# Patient Record
Sex: Male | Born: 1945 | Race: White | Hispanic: No | Smoking: Former smoker
Health system: Southern US, Community
[De-identification: ages and names within clinical notes are randomized; demographics above are authoritative.]

## PROBLEM LIST (undated history)

## (undated) ENCOUNTER — Inpatient Hospital Stay (HOSPITAL_BASED_OUTPATIENT_CLINIC_OR_DEPARTMENT_OTHER): Payer: Medicare HMO | Admitting: Podiatry

## (undated) DIAGNOSIS — E119 Type 2 diabetes mellitus without complications: Secondary | ICD-10-CM

## (undated) DIAGNOSIS — L0390 Cellulitis, unspecified: Secondary | ICD-10-CM

## (undated) DIAGNOSIS — I4891 Unspecified atrial fibrillation: Secondary | ICD-10-CM

## (undated) DIAGNOSIS — F329 Major depressive disorder, single episode, unspecified: Secondary | ICD-10-CM

## (undated) DIAGNOSIS — G6181 Chronic inflammatory demyelinating polyneuritis: Secondary | ICD-10-CM

## (undated) DIAGNOSIS — M109 Gout, unspecified: Secondary | ICD-10-CM

## (undated) DIAGNOSIS — I517 Cardiomegaly: Secondary | ICD-10-CM

## (undated) DIAGNOSIS — F32A Depression, unspecified: Secondary | ICD-10-CM

## (undated) HISTORY — DX: Major depressive disorder, single episode, unspecified: F32.9

## (undated) HISTORY — PX: APPENDECTOMY: SHX54

## (undated) HISTORY — DX: Type 2 diabetes mellitus without complications: E11.9

## (undated) HISTORY — DX: Gout, unspecified: M10.9

## (undated) HISTORY — DX: Chronic inflammatory demyelinating polyneuritis: G61.81

## (undated) HISTORY — DX: Cardiomegaly: I51.7

## (undated) HISTORY — DX: Unspecified atrial fibrillation: I48.91

## (undated) HISTORY — PX: VOCAL CORD LATERALIZATION, ENDOSCOPIC APPROACH W/ MLB: SHX2664

## (undated) HISTORY — DX: Cellulitis, unspecified: L03.90

## (undated) HISTORY — DX: Depression, unspecified: F32.A

---

## 2011-08-21 DIAGNOSIS — R29898 Other symptoms and signs involving the musculoskeletal system: Secondary | ICD-10-CM | POA: Insufficient documentation

## 2011-10-12 DIAGNOSIS — R490 Dysphonia: Secondary | ICD-10-CM | POA: Insufficient documentation

## 2011-10-12 DIAGNOSIS — R1310 Dysphagia, unspecified: Secondary | ICD-10-CM | POA: Insufficient documentation

## 2011-11-02 DIAGNOSIS — D333 Benign neoplasm of cranial nerves: Secondary | ICD-10-CM | POA: Insufficient documentation

## 2014-06-26 DIAGNOSIS — I8393 Asymptomatic varicose veins of bilateral lower extremities: Secondary | ICD-10-CM | POA: Insufficient documentation

## 2014-06-26 DIAGNOSIS — I8390 Asymptomatic varicose veins of unspecified lower extremity: Secondary | ICD-10-CM | POA: Insufficient documentation

## 2014-11-20 DIAGNOSIS — E1121 Type 2 diabetes mellitus with diabetic nephropathy: Secondary | ICD-10-CM | POA: Diagnosis not present

## 2014-11-20 DIAGNOSIS — R0600 Dyspnea, unspecified: Secondary | ICD-10-CM | POA: Diagnosis not present

## 2014-11-20 DIAGNOSIS — S91109A Unspecified open wound of unspecified toe(s) without damage to nail, initial encounter: Secondary | ICD-10-CM | POA: Diagnosis not present

## 2014-11-20 DIAGNOSIS — M109 Gout, unspecified: Secondary | ICD-10-CM | POA: Diagnosis not present

## 2014-11-20 DIAGNOSIS — F321 Major depressive disorder, single episode, moderate: Secondary | ICD-10-CM | POA: Diagnosis not present

## 2014-11-20 DIAGNOSIS — I10 Essential (primary) hypertension: Secondary | ICD-10-CM | POA: Diagnosis not present

## 2014-11-20 DIAGNOSIS — E1140 Type 2 diabetes mellitus with diabetic neuropathy, unspecified: Secondary | ICD-10-CM | POA: Diagnosis not present

## 2014-11-27 DIAGNOSIS — L89893 Pressure ulcer of other site, stage 3: Secondary | ICD-10-CM | POA: Diagnosis not present

## 2014-11-27 DIAGNOSIS — E119 Type 2 diabetes mellitus without complications: Secondary | ICD-10-CM | POA: Diagnosis not present

## 2014-11-27 DIAGNOSIS — Z794 Long term (current) use of insulin: Secondary | ICD-10-CM | POA: Diagnosis not present

## 2014-11-27 DIAGNOSIS — I10 Essential (primary) hypertension: Secondary | ICD-10-CM | POA: Diagnosis not present

## 2014-11-27 DIAGNOSIS — F329 Major depressive disorder, single episode, unspecified: Secondary | ICD-10-CM | POA: Diagnosis not present

## 2014-12-04 DIAGNOSIS — E119 Type 2 diabetes mellitus without complications: Secondary | ICD-10-CM | POA: Diagnosis not present

## 2014-12-04 DIAGNOSIS — I10 Essential (primary) hypertension: Secondary | ICD-10-CM | POA: Diagnosis not present

## 2014-12-04 DIAGNOSIS — F329 Major depressive disorder, single episode, unspecified: Secondary | ICD-10-CM | POA: Diagnosis not present

## 2014-12-04 DIAGNOSIS — L89893 Pressure ulcer of other site, stage 3: Secondary | ICD-10-CM | POA: Diagnosis not present

## 2014-12-04 DIAGNOSIS — Z794 Long term (current) use of insulin: Secondary | ICD-10-CM | POA: Diagnosis not present

## 2014-12-11 DIAGNOSIS — Z794 Long term (current) use of insulin: Secondary | ICD-10-CM | POA: Diagnosis not present

## 2014-12-11 DIAGNOSIS — I10 Essential (primary) hypertension: Secondary | ICD-10-CM | POA: Diagnosis not present

## 2014-12-11 DIAGNOSIS — L89893 Pressure ulcer of other site, stage 3: Secondary | ICD-10-CM | POA: Diagnosis not present

## 2014-12-11 DIAGNOSIS — F329 Major depressive disorder, single episode, unspecified: Secondary | ICD-10-CM | POA: Diagnosis not present

## 2014-12-11 DIAGNOSIS — E119 Type 2 diabetes mellitus without complications: Secondary | ICD-10-CM | POA: Diagnosis not present

## 2014-12-18 DIAGNOSIS — I10 Essential (primary) hypertension: Secondary | ICD-10-CM | POA: Diagnosis not present

## 2014-12-18 DIAGNOSIS — F329 Major depressive disorder, single episode, unspecified: Secondary | ICD-10-CM | POA: Diagnosis not present

## 2014-12-18 DIAGNOSIS — E119 Type 2 diabetes mellitus without complications: Secondary | ICD-10-CM | POA: Diagnosis not present

## 2014-12-18 DIAGNOSIS — L89893 Pressure ulcer of other site, stage 3: Secondary | ICD-10-CM | POA: Diagnosis not present

## 2014-12-18 DIAGNOSIS — Z794 Long term (current) use of insulin: Secondary | ICD-10-CM | POA: Diagnosis not present

## 2014-12-21 DIAGNOSIS — M546 Pain in thoracic spine: Secondary | ICD-10-CM | POA: Diagnosis not present

## 2014-12-21 DIAGNOSIS — E1121 Type 2 diabetes mellitus with diabetic nephropathy: Secondary | ICD-10-CM | POA: Diagnosis not present

## 2014-12-25 DIAGNOSIS — Z794 Long term (current) use of insulin: Secondary | ICD-10-CM | POA: Diagnosis not present

## 2014-12-25 DIAGNOSIS — L89893 Pressure ulcer of other site, stage 3: Secondary | ICD-10-CM | POA: Diagnosis not present

## 2014-12-25 DIAGNOSIS — E119 Type 2 diabetes mellitus without complications: Secondary | ICD-10-CM | POA: Diagnosis not present

## 2014-12-25 DIAGNOSIS — I10 Essential (primary) hypertension: Secondary | ICD-10-CM | POA: Diagnosis not present

## 2014-12-25 DIAGNOSIS — F329 Major depressive disorder, single episode, unspecified: Secondary | ICD-10-CM | POA: Diagnosis not present

## 2015-01-01 DIAGNOSIS — I10 Essential (primary) hypertension: Secondary | ICD-10-CM | POA: Diagnosis not present

## 2015-01-01 DIAGNOSIS — E119 Type 2 diabetes mellitus without complications: Secondary | ICD-10-CM | POA: Diagnosis not present

## 2015-01-01 DIAGNOSIS — Z794 Long term (current) use of insulin: Secondary | ICD-10-CM | POA: Diagnosis not present

## 2015-01-01 DIAGNOSIS — L89893 Pressure ulcer of other site, stage 3: Secondary | ICD-10-CM | POA: Diagnosis not present

## 2015-01-01 DIAGNOSIS — F329 Major depressive disorder, single episode, unspecified: Secondary | ICD-10-CM | POA: Diagnosis not present

## 2015-01-06 DIAGNOSIS — F329 Major depressive disorder, single episode, unspecified: Secondary | ICD-10-CM | POA: Diagnosis not present

## 2015-01-06 DIAGNOSIS — Z794 Long term (current) use of insulin: Secondary | ICD-10-CM | POA: Diagnosis not present

## 2015-01-06 DIAGNOSIS — L89893 Pressure ulcer of other site, stage 3: Secondary | ICD-10-CM | POA: Diagnosis not present

## 2015-01-06 DIAGNOSIS — E119 Type 2 diabetes mellitus without complications: Secondary | ICD-10-CM | POA: Diagnosis not present

## 2015-01-06 DIAGNOSIS — I10 Essential (primary) hypertension: Secondary | ICD-10-CM | POA: Diagnosis not present

## 2015-01-14 DIAGNOSIS — E119 Type 2 diabetes mellitus without complications: Secondary | ICD-10-CM | POA: Diagnosis not present

## 2015-01-14 DIAGNOSIS — L89893 Pressure ulcer of other site, stage 3: Secondary | ICD-10-CM | POA: Diagnosis not present

## 2015-01-14 DIAGNOSIS — I10 Essential (primary) hypertension: Secondary | ICD-10-CM | POA: Diagnosis not present

## 2015-01-14 DIAGNOSIS — F329 Major depressive disorder, single episode, unspecified: Secondary | ICD-10-CM | POA: Diagnosis not present

## 2015-01-14 DIAGNOSIS — Z794 Long term (current) use of insulin: Secondary | ICD-10-CM | POA: Diagnosis not present

## 2015-01-20 DIAGNOSIS — F329 Major depressive disorder, single episode, unspecified: Secondary | ICD-10-CM | POA: Diagnosis not present

## 2015-01-20 DIAGNOSIS — I10 Essential (primary) hypertension: Secondary | ICD-10-CM | POA: Diagnosis not present

## 2015-01-20 DIAGNOSIS — E119 Type 2 diabetes mellitus without complications: Secondary | ICD-10-CM | POA: Diagnosis not present

## 2015-01-20 DIAGNOSIS — Z794 Long term (current) use of insulin: Secondary | ICD-10-CM | POA: Diagnosis not present

## 2015-01-20 DIAGNOSIS — L89893 Pressure ulcer of other site, stage 3: Secondary | ICD-10-CM | POA: Diagnosis not present

## 2015-01-26 DIAGNOSIS — E119 Type 2 diabetes mellitus without complications: Secondary | ICD-10-CM | POA: Diagnosis not present

## 2015-01-26 DIAGNOSIS — I10 Essential (primary) hypertension: Secondary | ICD-10-CM | POA: Diagnosis not present

## 2015-01-26 DIAGNOSIS — L89893 Pressure ulcer of other site, stage 3: Secondary | ICD-10-CM | POA: Diagnosis not present

## 2015-01-26 DIAGNOSIS — F329 Major depressive disorder, single episode, unspecified: Secondary | ICD-10-CM | POA: Diagnosis not present

## 2015-01-26 DIAGNOSIS — Z794 Long term (current) use of insulin: Secondary | ICD-10-CM | POA: Diagnosis not present

## 2015-01-29 DIAGNOSIS — E119 Type 2 diabetes mellitus without complications: Secondary | ICD-10-CM | POA: Diagnosis not present

## 2015-01-29 DIAGNOSIS — I10 Essential (primary) hypertension: Secondary | ICD-10-CM | POA: Diagnosis not present

## 2015-01-29 DIAGNOSIS — L89893 Pressure ulcer of other site, stage 3: Secondary | ICD-10-CM | POA: Diagnosis not present

## 2015-01-29 DIAGNOSIS — F329 Major depressive disorder, single episode, unspecified: Secondary | ICD-10-CM | POA: Diagnosis not present

## 2015-01-29 DIAGNOSIS — Z794 Long term (current) use of insulin: Secondary | ICD-10-CM | POA: Diagnosis not present

## 2015-02-05 DIAGNOSIS — Z794 Long term (current) use of insulin: Secondary | ICD-10-CM | POA: Diagnosis not present

## 2015-02-05 DIAGNOSIS — E119 Type 2 diabetes mellitus without complications: Secondary | ICD-10-CM | POA: Diagnosis not present

## 2015-02-05 DIAGNOSIS — F329 Major depressive disorder, single episode, unspecified: Secondary | ICD-10-CM | POA: Diagnosis not present

## 2015-02-05 DIAGNOSIS — L89893 Pressure ulcer of other site, stage 3: Secondary | ICD-10-CM | POA: Diagnosis not present

## 2015-02-05 DIAGNOSIS — I10 Essential (primary) hypertension: Secondary | ICD-10-CM | POA: Diagnosis not present

## 2015-03-02 DIAGNOSIS — S91109A Unspecified open wound of unspecified toe(s) without damage to nail, initial encounter: Secondary | ICD-10-CM | POA: Diagnosis not present

## 2015-03-02 DIAGNOSIS — E780 Pure hypercholesterolemia: Secondary | ICD-10-CM | POA: Diagnosis not present

## 2015-03-02 DIAGNOSIS — E1121 Type 2 diabetes mellitus with diabetic nephropathy: Secondary | ICD-10-CM | POA: Diagnosis not present

## 2015-03-04 DIAGNOSIS — E11621 Type 2 diabetes mellitus with foot ulcer: Secondary | ICD-10-CM | POA: Diagnosis not present

## 2015-03-04 DIAGNOSIS — L97519 Non-pressure chronic ulcer of other part of right foot with unspecified severity: Secondary | ICD-10-CM | POA: Diagnosis not present

## 2015-03-04 DIAGNOSIS — E1142 Type 2 diabetes mellitus with diabetic polyneuropathy: Secondary | ICD-10-CM | POA: Diagnosis not present

## 2015-03-11 DIAGNOSIS — E11621 Type 2 diabetes mellitus with foot ulcer: Secondary | ICD-10-CM | POA: Diagnosis not present

## 2015-03-11 DIAGNOSIS — L97519 Non-pressure chronic ulcer of other part of right foot with unspecified severity: Secondary | ICD-10-CM | POA: Diagnosis not present

## 2015-03-11 DIAGNOSIS — E1142 Type 2 diabetes mellitus with diabetic polyneuropathy: Secondary | ICD-10-CM | POA: Diagnosis not present

## 2015-03-25 DIAGNOSIS — E11621 Type 2 diabetes mellitus with foot ulcer: Secondary | ICD-10-CM | POA: Diagnosis not present

## 2015-03-25 DIAGNOSIS — L97519 Non-pressure chronic ulcer of other part of right foot with unspecified severity: Secondary | ICD-10-CM | POA: Diagnosis not present

## 2015-03-25 DIAGNOSIS — E1142 Type 2 diabetes mellitus with diabetic polyneuropathy: Secondary | ICD-10-CM | POA: Diagnosis not present

## 2015-04-08 DIAGNOSIS — E11621 Type 2 diabetes mellitus with foot ulcer: Secondary | ICD-10-CM | POA: Diagnosis not present

## 2015-04-08 DIAGNOSIS — E1142 Type 2 diabetes mellitus with diabetic polyneuropathy: Secondary | ICD-10-CM | POA: Diagnosis not present

## 2015-04-08 DIAGNOSIS — L97519 Non-pressure chronic ulcer of other part of right foot with unspecified severity: Secondary | ICD-10-CM | POA: Diagnosis not present

## 2015-04-29 DIAGNOSIS — L97519 Non-pressure chronic ulcer of other part of right foot with unspecified severity: Secondary | ICD-10-CM | POA: Diagnosis not present

## 2015-04-29 DIAGNOSIS — E11621 Type 2 diabetes mellitus with foot ulcer: Secondary | ICD-10-CM | POA: Diagnosis not present

## 2015-05-18 DIAGNOSIS — L97519 Non-pressure chronic ulcer of other part of right foot with unspecified severity: Secondary | ICD-10-CM | POA: Diagnosis not present

## 2015-05-18 DIAGNOSIS — E1142 Type 2 diabetes mellitus with diabetic polyneuropathy: Secondary | ICD-10-CM | POA: Diagnosis not present

## 2015-05-18 DIAGNOSIS — E11621 Type 2 diabetes mellitus with foot ulcer: Secondary | ICD-10-CM | POA: Diagnosis not present

## 2015-05-26 DIAGNOSIS — L97509 Non-pressure chronic ulcer of other part of unspecified foot with unspecified severity: Secondary | ICD-10-CM | POA: Diagnosis not present

## 2015-05-26 DIAGNOSIS — E11621 Type 2 diabetes mellitus with foot ulcer: Secondary | ICD-10-CM | POA: Diagnosis not present

## 2015-06-08 DIAGNOSIS — E1142 Type 2 diabetes mellitus with diabetic polyneuropathy: Secondary | ICD-10-CM | POA: Diagnosis not present

## 2015-06-08 DIAGNOSIS — L97519 Non-pressure chronic ulcer of other part of right foot with unspecified severity: Secondary | ICD-10-CM | POA: Diagnosis not present

## 2015-06-08 DIAGNOSIS — E11621 Type 2 diabetes mellitus with foot ulcer: Secondary | ICD-10-CM | POA: Diagnosis not present

## 2015-06-10 DIAGNOSIS — R0600 Dyspnea, unspecified: Secondary | ICD-10-CM | POA: Diagnosis not present

## 2015-06-10 DIAGNOSIS — E119 Type 2 diabetes mellitus without complications: Secondary | ICD-10-CM | POA: Diagnosis not present

## 2015-06-10 DIAGNOSIS — B029 Zoster without complications: Secondary | ICD-10-CM | POA: Diagnosis not present

## 2015-06-10 DIAGNOSIS — R338 Other retention of urine: Secondary | ICD-10-CM | POA: Diagnosis not present

## 2015-06-10 DIAGNOSIS — I10 Essential (primary) hypertension: Secondary | ICD-10-CM | POA: Diagnosis not present

## 2015-06-10 DIAGNOSIS — S91109A Unspecified open wound of unspecified toe(s) without damage to nail, initial encounter: Secondary | ICD-10-CM | POA: Diagnosis not present

## 2015-06-10 DIAGNOSIS — R112 Nausea with vomiting, unspecified: Secondary | ICD-10-CM | POA: Diagnosis not present

## 2015-06-10 DIAGNOSIS — B356 Tinea cruris: Secondary | ICD-10-CM | POA: Diagnosis not present

## 2015-06-10 DIAGNOSIS — E780 Pure hypercholesterolemia: Secondary | ICD-10-CM | POA: Diagnosis not present

## 2015-06-10 DIAGNOSIS — E1121 Type 2 diabetes mellitus with diabetic nephropathy: Secondary | ICD-10-CM | POA: Diagnosis not present

## 2015-06-10 DIAGNOSIS — F321 Major depressive disorder, single episode, moderate: Secondary | ICD-10-CM | POA: Diagnosis not present

## 2015-06-29 DIAGNOSIS — R0602 Shortness of breath: Secondary | ICD-10-CM | POA: Diagnosis not present

## 2015-06-29 DIAGNOSIS — R0600 Dyspnea, unspecified: Secondary | ICD-10-CM | POA: Diagnosis not present

## 2015-06-29 DIAGNOSIS — E11621 Type 2 diabetes mellitus with foot ulcer: Secondary | ICD-10-CM | POA: Diagnosis not present

## 2015-06-29 DIAGNOSIS — E1142 Type 2 diabetes mellitus with diabetic polyneuropathy: Secondary | ICD-10-CM | POA: Diagnosis not present

## 2015-06-29 DIAGNOSIS — I517 Cardiomegaly: Secondary | ICD-10-CM | POA: Diagnosis not present

## 2015-06-29 DIAGNOSIS — L97519 Non-pressure chronic ulcer of other part of right foot with unspecified severity: Secondary | ICD-10-CM | POA: Diagnosis not present

## 2015-07-02 DIAGNOSIS — E119 Type 2 diabetes mellitus without complications: Secondary | ICD-10-CM | POA: Diagnosis not present

## 2015-07-12 DIAGNOSIS — F321 Major depressive disorder, single episode, moderate: Secondary | ICD-10-CM | POA: Diagnosis not present

## 2015-07-12 DIAGNOSIS — B356 Tinea cruris: Secondary | ICD-10-CM | POA: Diagnosis not present

## 2015-07-12 DIAGNOSIS — R0602 Shortness of breath: Secondary | ICD-10-CM | POA: Diagnosis not present

## 2015-07-12 DIAGNOSIS — Z23 Encounter for immunization: Secondary | ICD-10-CM | POA: Diagnosis not present

## 2015-07-12 DIAGNOSIS — R609 Edema, unspecified: Secondary | ICD-10-CM | POA: Diagnosis not present

## 2015-07-12 DIAGNOSIS — R0600 Dyspnea, unspecified: Secondary | ICD-10-CM | POA: Diagnosis not present

## 2015-07-12 DIAGNOSIS — I517 Cardiomegaly: Secondary | ICD-10-CM | POA: Diagnosis not present

## 2015-07-12 DIAGNOSIS — B029 Zoster without complications: Secondary | ICD-10-CM | POA: Diagnosis not present

## 2015-07-21 DIAGNOSIS — I429 Cardiomyopathy, unspecified: Secondary | ICD-10-CM | POA: Diagnosis not present

## 2015-07-21 DIAGNOSIS — E088 Diabetes mellitus due to underlying condition with unspecified complications: Secondary | ICD-10-CM | POA: Diagnosis not present

## 2015-07-21 DIAGNOSIS — I480 Paroxysmal atrial fibrillation: Secondary | ICD-10-CM | POA: Diagnosis not present

## 2015-07-21 DIAGNOSIS — R0602 Shortness of breath: Secondary | ICD-10-CM | POA: Diagnosis not present

## 2015-07-29 DIAGNOSIS — I428 Other cardiomyopathies: Secondary | ICD-10-CM | POA: Diagnosis not present

## 2015-07-29 DIAGNOSIS — M47819 Spondylosis without myelopathy or radiculopathy, site unspecified: Secondary | ICD-10-CM | POA: Diagnosis not present

## 2015-07-29 DIAGNOSIS — I447 Left bundle-branch block, unspecified: Secondary | ICD-10-CM | POA: Diagnosis not present

## 2015-07-29 DIAGNOSIS — I509 Heart failure, unspecified: Secondary | ICD-10-CM | POA: Diagnosis not present

## 2015-07-29 DIAGNOSIS — E088 Diabetes mellitus due to underlying condition with unspecified complications: Secondary | ICD-10-CM | POA: Diagnosis not present

## 2015-07-29 DIAGNOSIS — Z452 Encounter for adjustment and management of vascular access device: Secondary | ICD-10-CM | POA: Diagnosis not present

## 2015-07-29 DIAGNOSIS — I480 Paroxysmal atrial fibrillation: Secondary | ICD-10-CM | POA: Diagnosis not present

## 2015-07-29 DIAGNOSIS — I429 Cardiomyopathy, unspecified: Secondary | ICD-10-CM | POA: Diagnosis not present

## 2015-07-29 DIAGNOSIS — R0600 Dyspnea, unspecified: Secondary | ICD-10-CM | POA: Diagnosis not present

## 2015-07-29 DIAGNOSIS — R0602 Shortness of breath: Secondary | ICD-10-CM | POA: Diagnosis not present

## 2015-07-29 DIAGNOSIS — I2510 Atherosclerotic heart disease of native coronary artery without angina pectoris: Secondary | ICD-10-CM | POA: Diagnosis not present

## 2015-08-01 DIAGNOSIS — L03119 Cellulitis of unspecified part of limb: Secondary | ICD-10-CM | POA: Diagnosis not present

## 2015-08-01 DIAGNOSIS — Z794 Long term (current) use of insulin: Secondary | ICD-10-CM | POA: Diagnosis not present

## 2015-08-01 DIAGNOSIS — E11621 Type 2 diabetes mellitus with foot ulcer: Secondary | ICD-10-CM | POA: Diagnosis not present

## 2015-08-01 DIAGNOSIS — L02611 Cutaneous abscess of right foot: Secondary | ICD-10-CM | POA: Diagnosis not present

## 2015-08-01 DIAGNOSIS — L03115 Cellulitis of right lower limb: Secondary | ICD-10-CM | POA: Diagnosis not present

## 2015-08-01 DIAGNOSIS — R0600 Dyspnea, unspecified: Secondary | ICD-10-CM | POA: Diagnosis not present

## 2015-08-01 DIAGNOSIS — F05 Delirium due to known physiological condition: Secondary | ICD-10-CM | POA: Diagnosis not present

## 2015-08-01 DIAGNOSIS — E1351 Other specified diabetes mellitus with diabetic peripheral angiopathy without gangrene: Secondary | ICD-10-CM | POA: Diagnosis not present

## 2015-08-01 DIAGNOSIS — R079 Chest pain, unspecified: Secondary | ICD-10-CM | POA: Diagnosis not present

## 2015-08-01 DIAGNOSIS — I8311 Varicose veins of right lower extremity with inflammation: Secondary | ICD-10-CM | POA: Diagnosis not present

## 2015-08-01 DIAGNOSIS — R531 Weakness: Secondary | ICD-10-CM | POA: Diagnosis not present

## 2015-08-01 DIAGNOSIS — R61 Generalized hyperhidrosis: Secondary | ICD-10-CM | POA: Diagnosis not present

## 2015-08-01 DIAGNOSIS — E1165 Type 2 diabetes mellitus with hyperglycemia: Secondary | ICD-10-CM | POA: Diagnosis not present

## 2015-08-01 DIAGNOSIS — L97529 Non-pressure chronic ulcer of other part of left foot with unspecified severity: Secondary | ICD-10-CM | POA: Diagnosis not present

## 2015-08-01 DIAGNOSIS — R2689 Other abnormalities of gait and mobility: Secondary | ICD-10-CM | POA: Diagnosis not present

## 2015-08-01 DIAGNOSIS — E1151 Type 2 diabetes mellitus with diabetic peripheral angiopathy without gangrene: Secondary | ICD-10-CM | POA: Diagnosis not present

## 2015-08-01 DIAGNOSIS — L0291 Cutaneous abscess, unspecified: Secondary | ICD-10-CM | POA: Diagnosis not present

## 2015-08-01 DIAGNOSIS — R0602 Shortness of breath: Secondary | ICD-10-CM | POA: Diagnosis not present

## 2015-08-01 DIAGNOSIS — L97519 Non-pressure chronic ulcer of other part of right foot with unspecified severity: Secondary | ICD-10-CM | POA: Diagnosis not present

## 2015-08-01 DIAGNOSIS — J45909 Unspecified asthma, uncomplicated: Secondary | ICD-10-CM | POA: Diagnosis not present

## 2015-08-01 DIAGNOSIS — R0902 Hypoxemia: Secondary | ICD-10-CM | POA: Diagnosis not present

## 2015-08-09 ENCOUNTER — Ambulatory Visit: Payer: Self-pay | Admitting: Neurology

## 2015-08-10 DIAGNOSIS — E11621 Type 2 diabetes mellitus with foot ulcer: Secondary | ICD-10-CM | POA: Diagnosis not present

## 2015-08-10 DIAGNOSIS — L97519 Non-pressure chronic ulcer of other part of right foot with unspecified severity: Secondary | ICD-10-CM | POA: Diagnosis not present

## 2015-08-10 DIAGNOSIS — E1142 Type 2 diabetes mellitus with diabetic polyneuropathy: Secondary | ICD-10-CM | POA: Diagnosis not present

## 2015-08-12 DIAGNOSIS — R51 Headache: Secondary | ICD-10-CM | POA: Diagnosis not present

## 2015-08-12 DIAGNOSIS — I70211 Atherosclerosis of native arteries of extremities with intermittent claudication, right leg: Secondary | ICD-10-CM | POA: Diagnosis not present

## 2015-08-12 DIAGNOSIS — E1121 Type 2 diabetes mellitus with diabetic nephropathy: Secondary | ICD-10-CM | POA: Diagnosis not present

## 2015-08-12 DIAGNOSIS — F05 Delirium due to known physiological condition: Secondary | ICD-10-CM | POA: Diagnosis not present

## 2015-08-12 DIAGNOSIS — L03115 Cellulitis of right lower limb: Secondary | ICD-10-CM | POA: Diagnosis not present

## 2015-08-12 DIAGNOSIS — L97511 Non-pressure chronic ulcer of other part of right foot limited to breakdown of skin: Secondary | ICD-10-CM | POA: Diagnosis not present

## 2015-08-18 DIAGNOSIS — G936 Cerebral edema: Secondary | ICD-10-CM | POA: Diagnosis not present

## 2015-08-18 DIAGNOSIS — I8393 Asymptomatic varicose veins of bilateral lower extremities: Secondary | ICD-10-CM | POA: Diagnosis not present

## 2015-08-18 DIAGNOSIS — R51 Headache: Secondary | ICD-10-CM | POA: Diagnosis not present

## 2015-08-18 DIAGNOSIS — I480 Paroxysmal atrial fibrillation: Secondary | ICD-10-CM | POA: Diagnosis not present

## 2015-08-18 DIAGNOSIS — E088 Diabetes mellitus due to underlying condition with unspecified complications: Secondary | ICD-10-CM | POA: Diagnosis not present

## 2015-08-24 DIAGNOSIS — E1142 Type 2 diabetes mellitus with diabetic polyneuropathy: Secondary | ICD-10-CM | POA: Diagnosis not present

## 2015-08-24 DIAGNOSIS — E11621 Type 2 diabetes mellitus with foot ulcer: Secondary | ICD-10-CM | POA: Diagnosis not present

## 2015-08-24 DIAGNOSIS — L97519 Non-pressure chronic ulcer of other part of right foot with unspecified severity: Secondary | ICD-10-CM | POA: Diagnosis not present

## 2015-08-24 NOTE — Patient Outreach (Signed)
[facility]) Care Management  08/24/2015  Johnathan Wright 08/17/1946 [id number]   Referral from [place], assigned to Quinn Plowman, RN for patient outreach.  [name] [name], [name] Care Management Assistant

## 2015-08-25 DIAGNOSIS — R220 Localized swelling, mass and lump, head: Secondary | ICD-10-CM | POA: Diagnosis not present

## 2015-08-26 DIAGNOSIS — L97511 Non-pressure chronic ulcer of other part of right foot limited to breakdown of skin: Secondary | ICD-10-CM | POA: Diagnosis not present

## 2015-08-26 DIAGNOSIS — E11621 Type 2 diabetes mellitus with foot ulcer: Secondary | ICD-10-CM | POA: Diagnosis not present

## 2015-08-26 DIAGNOSIS — M109 Gout, unspecified: Secondary | ICD-10-CM | POA: Diagnosis not present

## 2015-08-26 DIAGNOSIS — Z87891 Personal history of nicotine dependence: Secondary | ICD-10-CM | POA: Diagnosis not present

## 2015-08-26 DIAGNOSIS — Z794 Long term (current) use of insulin: Secondary | ICD-10-CM | POA: Diagnosis not present

## 2015-08-26 DIAGNOSIS — G4730 Sleep apnea, unspecified: Secondary | ICD-10-CM | POA: Diagnosis not present

## 2015-08-26 DIAGNOSIS — L97513 Non-pressure chronic ulcer of other part of right foot with necrosis of muscle: Secondary | ICD-10-CM | POA: Diagnosis not present

## 2015-08-26 DIAGNOSIS — G629 Polyneuropathy, unspecified: Secondary | ICD-10-CM | POA: Diagnosis not present

## 2015-09-01 ENCOUNTER — Other Ambulatory Visit: Payer: Self-pay

## 2015-09-01 NOTE — Patient Outreach (Signed)
[facility]) Care Management  09/01/2015  Johnathan Wright [birth year] [id number]   SUBJECTIVE: Telephone call to patient regarding  [facility] referral.  HIPAA verified with patient.  Patient request RNCM speak with him and his caregiver Debbra Riding. Patient gave verbal authorization to speak with Debbra Riding regarding any and all of his medical information.  RNCM discussed and offered [facility] care management services. Patient and caregiver refused services.  Caregiver states patient would not be receptive to services. Caregiver states she has a Emergency planning/management officer and EMT background.  States patient only wants her at this time to provide care. RNCM offered to send patient [place] Management outreach letter and brochure.  Patient and caregiver verbalized agreement to receive information.   ASSESSMENT:  [facility] referral for diabetes.  PLAN; RNCM will refer patient to [facility] to close due to refusal of services.  RNCM will notify patients primary MD of refusal of [place] management services.   Quinn Plowman RN,BSN,CCM [place] Coordinator [phone]

## 2015-09-02 DIAGNOSIS — L97513 Non-pressure chronic ulcer of other part of right foot with necrosis of muscle: Secondary | ICD-10-CM | POA: Diagnosis not present

## 2015-09-02 DIAGNOSIS — E11622 Type 2 diabetes mellitus with other skin ulcer: Secondary | ICD-10-CM | POA: Diagnosis not present

## 2015-09-02 DIAGNOSIS — L97511 Non-pressure chronic ulcer of other part of right foot limited to breakdown of skin: Secondary | ICD-10-CM | POA: Diagnosis not present

## 2015-09-02 DIAGNOSIS — E11621 Type 2 diabetes mellitus with foot ulcer: Secondary | ICD-10-CM | POA: Diagnosis not present

## 2015-09-02 DIAGNOSIS — L97811 Non-pressure chronic ulcer of other part of right lower leg limited to breakdown of skin: Secondary | ICD-10-CM | POA: Diagnosis not present

## 2015-09-03 DIAGNOSIS — L97512 Non-pressure chronic ulcer of other part of right foot with fat layer exposed: Secondary | ICD-10-CM | POA: Diagnosis not present

## 2015-09-03 DIAGNOSIS — L97812 Non-pressure chronic ulcer of other part of right lower leg with fat layer exposed: Secondary | ICD-10-CM | POA: Diagnosis not present

## 2015-09-03 DIAGNOSIS — E11622 Type 2 diabetes mellitus with other skin ulcer: Secondary | ICD-10-CM | POA: Diagnosis not present

## 2015-09-03 DIAGNOSIS — E11621 Type 2 diabetes mellitus with foot ulcer: Secondary | ICD-10-CM | POA: Diagnosis not present

## 2015-09-03 NOTE — Patient Outreach (Signed)
[facility]) Care Management  09/03/2015  Johnathan Wright 02-06-1946 [id number]   Notification received from Quinn Plowman, RN to close case due to patient refusing services.  Johnathan Wright, [place] Care Management Assistant

## 2015-09-07 DIAGNOSIS — E11621 Type 2 diabetes mellitus with foot ulcer: Secondary | ICD-10-CM | POA: Diagnosis not present

## 2015-09-09 DIAGNOSIS — E11621 Type 2 diabetes mellitus with foot ulcer: Secondary | ICD-10-CM | POA: Diagnosis not present

## 2015-09-09 DIAGNOSIS — L97211 Non-pressure chronic ulcer of right calf limited to breakdown of skin: Secondary | ICD-10-CM | POA: Diagnosis not present

## 2015-09-09 DIAGNOSIS — L97513 Non-pressure chronic ulcer of other part of right foot with necrosis of muscle: Secondary | ICD-10-CM | POA: Diagnosis not present

## 2015-09-09 DIAGNOSIS — L97511 Non-pressure chronic ulcer of other part of right foot limited to breakdown of skin: Secondary | ICD-10-CM | POA: Diagnosis not present

## 2015-09-09 DIAGNOSIS — E11622 Type 2 diabetes mellitus with other skin ulcer: Secondary | ICD-10-CM | POA: Diagnosis not present

## 2015-09-10 ENCOUNTER — Other Ambulatory Visit (INDEPENDENT_AMBULATORY_CARE_PROVIDER_SITE_OTHER): Payer: Commercial Managed Care - HMO

## 2015-09-10 ENCOUNTER — Encounter: Payer: Self-pay | Admitting: Neurology

## 2015-09-10 ENCOUNTER — Ambulatory Visit (INDEPENDENT_AMBULATORY_CARE_PROVIDER_SITE_OTHER): Payer: Commercial Managed Care - HMO | Admitting: Neurology

## 2015-09-10 VITALS — BP 154/90 | HR 100 | Ht 74.0 in

## 2015-09-10 DIAGNOSIS — R269 Unspecified abnormalities of gait and mobility: Secondary | ICD-10-CM

## 2015-09-10 DIAGNOSIS — D333 Benign neoplasm of cranial nerves: Secondary | ICD-10-CM | POA: Diagnosis not present

## 2015-09-10 DIAGNOSIS — G6181 Chronic inflammatory demyelinating polyneuritis: Secondary | ICD-10-CM

## 2015-09-10 LAB — SEDIMENTATION RATE: Sed Rate: 55 mm/hr — ABNORMAL HIGH (ref 0–22)

## 2015-09-10 LAB — TSH: TSH: 3.09 u[IU]/mL (ref 0.35–4.50)

## 2015-09-10 LAB — VITAMIN B12: Vitamin B-12: 214 pg/mL (ref 211–911)

## 2015-09-10 NOTE — Progress Notes (Signed)
[facility] Clinic Note - Initial Visit   Date: 09/10/2015  Johnathan Wright MRN: [id number] DOB: 11/20/1945   Dear Dr. Tobie Poet:  Thank you for your kind referral of Johnathan Wright for consultation of CIDP. Although his history is well known to you, please allow [place] to reiterate it for the purpose of our medical record. The patient was accompanied to the clinic by wife who also provides collateral information.     History of Present Illness: Johnathan Wright is a 69 y.o. right-handed Caucasian male with morbid obesity, gout, hyperlipidemia, depression, insulin dependent diabetes mellitus presenting for evaluation of gait unsteadiness.    In 2012, he developed progressive gait difficulty, leg weakness, and paresthesias which ultimately led to hospital admission at [facility] and the diagnosis of CIDP.  He was initially treated with plasmapheresis and solumedrol pulses until 2012 and then switched to monotherapy with solumedrol weekly x 8 weeks, then every other week through 02/2012.  By May 2013, he was walking independently again.  Last evaluated by Dr. Harriet Pho at [facility] in 2013 for CIDP.  Unfortunately, he has significant weight gain with steroid treatment and has had extreme difficulty trying to loose weight.  Around late 2015, he began having greater difficulty with walking and balance.  He also developed right great toe ulceration with poor wound healing and has to wear a boot for this, which makes his walking even more difficult.   He has numbness to his mid-calf and numbness involving the medial forearm into his 5th digit bilaterally, which has been constant since 2012; this has not worsened over the past few years.  Denies any weakness.     He has not had any falls this year.  He has been using a walker intermittently over the past three years.  His work-up incidentally also found acoustic neuroma and he is followed by Dr. Redmond Pulling in  neurosurgery at [facility].  He denies any hearing loss or dizziness.  He endorses feelings of depression and low mood with all his medical problems going on.  He denies any SI/HI.    Out-side paper records, electronic medical record, and images have been reviewed where available and summarized as:  MRI brain wwo contrast 11/02/2011: 1. 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 There is unchanged mass effect upon the adjacent medulla and cerebellum. 2. Mild global volume loss with a background of chronic microvascular ischemia.  MRI lumbar spine 02/14/2011: Multilevel lumbar spondylitic change characterized by multilevel degenerative disc disease and facet hypertrophy superimposed on congenitally small canal. The worst level of stenosis is at L4/L5 where degenerative change results in severe left foraminal and moderate right foraminal narrowing. Additional levels of degenerative change, as described. No acute findings.    Past Medical History  Diagnosis Date  . Diabetes ([place])   . CIDP (chronic inflammatory demyelinating polyneuropathy) ([place])   . Atrial fibrillation ([place])   . Enlarged heart   . Cellulitis   . Gout   . Depression     Past Surgical History  Procedure Laterality Date  . Appendectomy    . Vocal cord lateralization, endoscopic approach w/ mlb       Medications:  Outpatient Encounter Prescriptions as of 09/10/2015  Medication Sig  . allopurinol (ZYLOPRIM) 100 MG tablet Take 100 mg by mouth daily.  Marland Kitchen aspirin 81 MG tablet Take 81 mg by mouth daily.  Marland Kitchen atorvastatin (LIPITOR) 10 MG tablet Take 10  mg by mouth daily.  . Bilberry 1000 MG CAPS Take by mouth.  . CINNAMON PO Take by mouth daily.  . clotrimazole (LOTRIMIN) 1 % cream Apply 1 application topically 2 (two) times daily.  . desvenlafaxine (PRISTIQ) 100 MG 24 hr tablet Take 100 mg by mouth daily.  . furosemide (LASIX) 40 MG tablet Take 40 mg by mouth.  .  Insulin Glargine (TOUJEO SOLOSTAR) 300 UNIT/ML SOPN Inject into the skin.  . L-Arginine 500 MG CAPS Take by mouth.  . Liraglutide (VICTOZA) 18 MG/3ML SOPN Inject into the skin.  . Lutein 40 MG CAPS Take by mouth.  . Lutein 6 MG TABS Take by mouth.  . LYSINE PO Take 1,000 mg by mouth daily.  . metFORMIN (GLUCOPHAGE) 1000 MG tablet Take 1,000 mg by mouth 2 (two) times daily with a meal.  . oxycodone (OXY-IR) 5 MG capsule Take 5 mg by mouth every 4 (four) hours as needed.   No facility-administered encounter medications on file as of 09/10/2015.     Allergies: No Known Allergies  Family History: Family History  Problem Relation Age of Onset  . Dementia Mother   . Heart disease Mother     Social History: Social History  Substance Use Topics  . Smoking status: Former Smoker    Quit date: 10/23/1969  . Smokeless tobacco: Not on file  . Alcohol Use: 0.0 oz/week    0 Standard drinks or equivalent per week     Comment: 6 pack a week   Social History   Social History Narrative  . No narrative on file    Review of Systems:  CONSTITUTIONAL: No fevers, chills, night sweats, or weight loss.   EYES: No visual changes or eye pain ENT: No hearing changes.  No history of nose bleeds.   RESPIRATORY: No cough, wheezing and shortness of breath.   CARDIOVASCULAR: Negative for chest pain, and palpitations.   GI: Negative for abdominal discomfort, blood in stools or black stools.  No recent change in bowel habits.   GU:  No history of incontinence.   MUSCLOSKELETAL: No history of joint pain or swelling.  No myalgias.   SKIN: Negative for lesions, rash, and itching.   HEMATOLOGY/ONCOLOGY: Negative for prolonged bleeding, bruising easily, and swollen nodes.  No history of cancer.   ENDOCRINE: Negative for cold or heat intolerance, polydipsia or goiter.   PSYCH:  +depression or anxiety symptoms.   NEURO: As Above.   Vital Signs:  BP 154/90 mmHg  Pulse 100  Ht   Wt    General Medical  Exam:   General:  Morbidly obese, appears comfortable.   Eyes/ENT: see cranial nerve examination.   Neck: No masses appreciated.  Full range of motion without tenderness.  No carotid bruits. Respiratory:  Clear to auscultation, good air entry bilaterally.   Cardiac:  Regular rate and rhythm, no murmur.   Extremities:  Marked lymphedema of the legs bilaterally.     Neurological Exam: MENTAL STATUS including orientation to time, place, person, recent and remote memory, attention span and concentration, language, and fund of knowledge is normal.  Speech is not dysarthric.  CRANIAL NERVES: II:  No visual field defects.  Unremarkable fundi.   III-IV-VI: Pupils equal round and reactive to light.  Normal conjugate, extra-ocular eye movements in all directions of gaze.  No nystagmus.  No ptosis V:  Normal facial sensation.  VII:  Normal facial symmetry and movements.   VIII:  Normal hearing and vestibular function.     IX-X:  Normal palatal movement.   XI:  Normal shoulder shrug and head rotation.   XII:  Normal tongue strength and range of motion, no deviation or fasciculation.  MOTOR:  No atrophy, fasciculations or abnormal movements.  No pronator drift.  Tone is normal.    Right Upper Extremity:    Left Upper Extremity:    Deltoid  5/5   Deltoid  5/5   Biceps  5/5   Biceps  5/5   Triceps  5/5   Triceps  5/5   Wrist extensors  5/5   Wrist extensors  5/5   Wrist flexors  5/5   Wrist flexors  5/5   Finger extensors  5/5   Finger extensors  5/5   Finger flexors  5/5   Finger flexors  5/5   Dorsal interossei  5/5   Dorsal interossei  5/5   Abductor pollicis  5/5   Abductor pollicis  5/5   Tone (Ashworth scale)  0  Tone (Ashworth scale)  0   Right Lower Extremity:    Left Lower Extremity:    Hip flexors  5/5   Hip flexors  5/5   Hip extensors  5/5   Hip extensors  5/5   Knee flexors  5/5   Knee flexors  5/5   Knee extensors  5/5   Knee extensors  5/5   Dorsiflexors  5/5   Dorsiflexors  5/5    Plantarflexors  5/5   Plantarflexors  5/5   Toe extensors  5/5   Toe extensors  5/5   Toe flexors  5/5   Toe flexors  5/5   Tone (Ashworth scale)  0  Tone (Ashworth scale)  0   MSRs:  Right                                                                 Left brachioradialis 2+  brachioradialis 2+  biceps 0  Biceps 0  triceps 0  triceps 0  Patellar 0  Patellar 0  ankle jerk 0  ankle jerk 0  Hoffman no  Hoffman no  plantar response mute  plantar response mute   SENSORY:  Sensation to all modalities is absent in the legs with mild preservation to pin prick and light tough above the knees.  Sensation to temperature, pin prick, and light touch intact in the upper extremities. Romberg's sign absent.   COORDINATION/GAIT: Normal finger-to- nose-finger.  Intact rapid alternating movements bilaterally.  Unable to rise from a chair without using arms.  Gait significantly wide-based, mostly due to body habitus and appears stable (unassisted). He cannot perform tandem or stressed gait.    IMPRESSION: 1.  Chronic inflammatory demyelinating polyradiculoneuropathy, diagnosed 2012 at [facility] based on NCS/EMG   - Previously managed with pulse PLEX and IVMP, followed by tapering dose of IVMP with marked improvement in balance  - Residual symptoms include mild gait unsteadiness, neuropathy affecting the lower legs which has been stable  - Unfortunately, he has gained significant weight after steroids which I do believe is contributing to his hew symptoms of gait imbalance, especially as there is no evidence of sensory ataxia and there return of his brachioradialis reflex bilaterally, which according to his last clinic visit at [facility], all reflexes were   absent.    - Overall, I am not convinced that he is having a relapse of CIDP and do not favor treatment with steroids, if anything, I would prefer IVIG  - Instead, I will check for metabolic derangements and vitamin deficiencies which may contribute to his  unsteadiness and start him in a gait training program.    - If there is no improvement or new worsening, NCS/EMG of the legs will be technically challenging due to body habitus and may yield inaccurate results, and therefore would recommend NCS/EMG of the upper extremities to look for active changes  2.  Acoustic neuroma, patient to follow-up with neurosurgery.    3.  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  Check ESR, vitamin B12, copper, ceruloplasmin, TSH, zinc 2.  Home PT for gait training 3.  NCS/EMG will be requested from [facility]  Return to clinic in 2-3 months.   The duration of this appointment visit was 45 minutes of face-to-face time with the patient.  Greater than 50% of this time was spent in counseling, explanation of diagnosis, planning of further management, and coordination of care.   Thank you for allowing me to participate in patient's care.  If I can answer any additional questions, I would be pleased to do so.    Sincerely,     K. , DO   

## 2015-09-10 NOTE — Patient Instructions (Addendum)
1.  Check blood work 2.  Start home therapy for gait training 3.  Follow-up with your neurosurgery about the acoustic neuroma 4.  Follow-up with your primary care provider for depressive symptoms 5.  Return to clinic in 2-3 months.  We may consider doing NCS/EMG of the upper extremities, pending the results of your prior study performed at [facility]

## 2015-09-12 LAB — COPPER, SERUM: Copper: 142 ug/dL (ref 70–175)

## 2015-09-12 LAB — ZINC: Zinc: 62 ug/dL (ref 60–130)

## 2015-09-13 ENCOUNTER — Encounter: Payer: Self-pay | Admitting: Neurology

## 2015-09-13 DIAGNOSIS — G6181 Chronic inflammatory demyelinating polyneuritis: Secondary | ICD-10-CM | POA: Insufficient documentation

## 2015-09-13 DIAGNOSIS — R269 Unspecified abnormalities of gait and mobility: Secondary | ICD-10-CM | POA: Insufficient documentation

## 2015-09-13 DIAGNOSIS — D333 Benign neoplasm of cranial nerves: Secondary | ICD-10-CM | POA: Insufficient documentation

## 2015-09-13 LAB — CERULOPLASMIN: Ceruloplasmin: 31 mg/dL (ref 18–36)

## 2015-09-14 NOTE — Progress Notes (Signed)
Note routed to PCP.  Signed release faxed to [place] records.

## 2015-09-17 DIAGNOSIS — L97519 Non-pressure chronic ulcer of other part of right foot with unspecified severity: Secondary | ICD-10-CM | POA: Diagnosis not present

## 2015-09-17 DIAGNOSIS — S81801A Unspecified open wound, right lower leg, initial encounter: Secondary | ICD-10-CM | POA: Diagnosis not present

## 2015-09-17 DIAGNOSIS — E11621 Type 2 diabetes mellitus with foot ulcer: Secondary | ICD-10-CM | POA: Diagnosis not present

## 2015-09-20 DIAGNOSIS — R7301 Impaired fasting glucose: Secondary | ICD-10-CM | POA: Diagnosis not present

## 2015-09-20 DIAGNOSIS — F322 Major depressive disorder, single episode, severe without psychotic features: Secondary | ICD-10-CM | POA: Diagnosis not present

## 2015-09-20 DIAGNOSIS — S91101A Unspecified open wound of right great toe without damage to nail, initial encounter: Secondary | ICD-10-CM | POA: Diagnosis not present

## 2015-09-20 DIAGNOSIS — E1121 Type 2 diabetes mellitus with diabetic nephropathy: Secondary | ICD-10-CM | POA: Diagnosis not present

## 2015-09-20 DIAGNOSIS — I484 Atypical atrial flutter: Secondary | ICD-10-CM | POA: Diagnosis not present

## 2015-09-20 DIAGNOSIS — R45851 Suicidal ideations: Secondary | ICD-10-CM | POA: Diagnosis not present

## 2015-09-20 DIAGNOSIS — G6181 Chronic inflammatory demyelinating polyneuritis: Secondary | ICD-10-CM | POA: Diagnosis not present

## 2015-09-20 DIAGNOSIS — E782 Mixed hyperlipidemia: Secondary | ICD-10-CM | POA: Diagnosis not present

## 2015-09-20 DIAGNOSIS — R51 Headache: Secondary | ICD-10-CM | POA: Diagnosis not present

## 2015-09-24 ENCOUNTER — Encounter: Payer: Self-pay | Admitting: *Deleted

## 2015-09-30 DIAGNOSIS — C718 Malignant neoplasm of overlapping sites of brain: Secondary | ICD-10-CM | POA: Diagnosis not present

## 2015-09-30 DIAGNOSIS — D333 Benign neoplasm of cranial nerves: Secondary | ICD-10-CM | POA: Diagnosis not present

## 2015-09-30 DIAGNOSIS — D496 Neoplasm of unspecified behavior of brain: Secondary | ICD-10-CM | POA: Diagnosis not present

## 2015-10-05 ENCOUNTER — Telehealth: Payer: Self-pay | Admitting: Neurology

## 2015-10-05 ENCOUNTER — Other Ambulatory Visit: Payer: Self-pay

## 2015-10-05 NOTE — Patient Outreach (Signed)
[facility]) Care Management  10/05/2015  KAEDE EISNER [birth year] [id number]  Telephone call to patient regarding [facility] referral. Unable to reach patient. HIPAA compliant voice message left with call back phone number.   PLAN; RNCM will attempt 2nd telephone call to patient regarding primary MD referral.   Quinn Plowman RN,BSN,CCM [place] Coordinator [phone]

## 2015-10-05 NOTE — Telephone Encounter (Signed)
Pt/ called concerning a letter and for PT info/ call back @ [phone]

## 2015-10-06 ENCOUNTER — Other Ambulatory Visit: Payer: Self-pay | Admitting: *Deleted

## 2015-10-06 MED ORDER — CYANOCOBALAMIN 1000 MCG/ML IJ SOLN: 1000.0000 ug | Freq: Once | INTRAMUSCULAR | Status: DC

## 2015-10-06 NOTE — Telephone Encounter (Signed)
Called patient and he is going to have his wife call me back.

## 2015-10-06 NOTE — Telephone Encounter (Signed)
Enid Derry called me back and requested for me to send the B12 orders to patient's PCP.  Fax # [phone].  She also said that they have not heard back about PT.  I will refax the order as well.

## 2015-10-07 DIAGNOSIS — S91101A Unspecified open wound of right great toe without damage to nail, initial encounter: Secondary | ICD-10-CM | POA: Diagnosis not present

## 2015-10-08 ENCOUNTER — Other Ambulatory Visit: Payer: Self-pay

## 2015-10-08 DIAGNOSIS — Z79899 Other long term (current) drug therapy: Secondary | ICD-10-CM

## 2015-10-08 DIAGNOSIS — I5022 Chronic systolic (congestive) heart failure: Secondary | ICD-10-CM

## 2015-10-08 NOTE — Patient Outreach (Signed)
[facility]) Care Management  10/08/2015  Johnathan Wright Nov 12, 1945 [id number]  SUBJECTIVE: Telephone call to patient regarding [facility] referral. HIPAA verified with patient. Patient gave verbal authorization to speak with his caregiver, Debbra Riding regarding any and all of his medical information. Patient states his caregiver knows more about his health information than he does. Discussed and offered [facility] care management services to patient/caregiver. Patient/caregiver agreed to services.   TOE WOUND: Caregiver, Debbra Riding states patient does have a wound to his toe.  States patient was going to the wound care clinic but has refused to go because he doesn't feel much is being done.  Caregiver states the wound care clinic has taught her and now she is doing daily dressing changes to patients toe. Caregiver states there is concern that toe may need to be amputated.   HEART FAILURE: Caregiver states patient was admitted to the hospital approximately 2 months ago with cellulitis to his legs. Caregiver states patient was told at that time that he had an enlarged heart heart and heart failure.  Caregiver states patient sees Dr. Geraldo Pitter, cardiologist.  Caregiver states she and patient are not fully sure how to handle his heart failure. Caregiver states patient becomes very short of breath and has difficulty walking or talking on the phone for extended periods of time. Caregiver states patients weight is over 400lbs. Caregiver states patient is not weighting daily due to scale not being able to register weight. Caregiver states patient has a walker and cane but seldom uses in the house.  Patient ambulates in home by holding on to furniture. Caregiver states patient does not appear to have much leg strength. Caregiver states its difficult for patient to get into and out of the car.  Caregiver states patient sees Dr. Posey Pronto who is going to order home physical therapy. Caregiver  states Dr. Posey Pronto wanted patient to start taking B12 injections but patient refused.   Caregiver states patient has Atrial fibrillation.  Caregiver states patients heart rate my be in the 70's then may drop down to 35. Caregiver denies patient being on any medications for atrial fibrillation.  DIABETES: Caregiver states patient's blood sugar has run in the low 100's and his A1c has averaged between 6 and 7.   CIDP: Caregiver states patient has CIDP (Chronic inflammatory demyelinating polyneuropathy).  States patient is being seen by Dr. Posey Pronto, neurologist.   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  Caregiver states patient has been seen by Dr. Redmond Pulling, surgeon at [facility] who states patient is not a surgical candidate due to his health.  Caregiver states patient has an appointment on 10/11/15 to see and oncologist at [facility] for consult regarding mass.   DEPRESSION: Caregiver states patient suffers with depression.  States patient is on Pristiq 100mg  1 time per day. Caregiver states patient has had depression/psychiatric problems for years.  Caregiver states patients last hospitalization approximately 2 months ago for cellulitis. Caregiver denies patient having any falls within the past year. Caregiver states she lives with patient and patients mother.  Caregiver states patients mother has vascular dementia.    ASSESSMENT: [facility] referral.  Patient will benefit from referral to community case manager for care coordination follow up, heart failure management and education. RNCM will request community case manager evaluate patients need and acceptance of social work services.  Referral to pharmacy for polypharmacy.   PLAN: RNCM will refer patient to community case  Freight forwarder and pharmacy.    Quinn Plowman RN,BSN,CCM [place] Coordinator [phone]

## 2015-10-11 DIAGNOSIS — D333 Benign neoplasm of cranial nerves: Secondary | ICD-10-CM | POA: Diagnosis not present

## 2015-10-14 ENCOUNTER — Other Ambulatory Visit: Payer: Self-pay

## 2015-10-14 NOTE — Patient Outreach (Signed)
Care coordination/new referral:  Reviewed referral note from [facility] and telephone assessment from Quinn Plowman.  Explained program to patient and he ask that I speak with caregiver  Judeen Hammans.  Explained program to [place]. She reports that patient has not been diagnosed with heart failure. She reports that patients DM is under good control. Reading today of 129.  Reports that patient has recently seen podiatrist, primary MD, neurologist, and neurosurgeon.  Reports patient will start radiation in 2 weeks. Started b12 injections yesterday.  Reports that patient has refused to go back to the wound center because of 45.00 co pay.  Caregiver is doing dressing changes to wound on toe. Reports wound on toe has been present for over 4 years.   Caregiver and patient unsure of interest in program due to no goals to work on.  Caregiver request call back on January 17th to determine interest in program.  Plan:  Will outreach patient on 11/09/2015 Provided my contact information to patient and caregiver.  Tomasa Rand, RN, BSN, CEN [facility] [employer] [phone]

## 2015-10-20 DIAGNOSIS — J060 Acute laryngopharyngitis: Secondary | ICD-10-CM | POA: Diagnosis not present

## 2015-10-20 DIAGNOSIS — E1121 Type 2 diabetes mellitus with diabetic nephropathy: Secondary | ICD-10-CM | POA: Diagnosis not present

## 2015-10-20 DIAGNOSIS — D496 Neoplasm of unspecified behavior of brain: Secondary | ICD-10-CM | POA: Diagnosis not present

## 2015-10-20 DIAGNOSIS — Z6841 Body Mass Index (BMI) 40.0 and over, adult: Secondary | ICD-10-CM | POA: Diagnosis not present

## 2015-10-25 ENCOUNTER — Other Ambulatory Visit: Payer: Self-pay

## 2015-10-25 NOTE — Patient Outreach (Signed)
Subjective: Johnathan Wright is a 70 year old male with a history of Diabetes who was referred to pharmacy for a medication review due to polypharmacy.  Per review of his chart, his wife has not decided if she would like a home visit from a community nurse currently and she does not have any questions regarding his medications.   Objective: Current Outpatient Prescriptions on File Prior to Visit  Medication Sig Dispense Refill  . allopurinol (ZYLOPRIM) 100 MG tablet Take 100 mg by mouth daily.    Marland Kitchen aspirin 81 MG tablet Take 81 mg by mouth daily.    Marland Kitchen atorvastatin (LIPITOR) 10 MG tablet Take 10 mg by mouth daily.    . Bilberry 1000 MG CAPS Take by mouth.    Marland Kitchen CINNAMON PO Take by mouth daily.    . clotrimazole (LOTRIMIN) 1 % cream Apply 1 application topically 2 (two) times daily.    . cyanocobalamin (,VITAMIN B-12,) 1000 MCG/ML injection Inject 1 mL (1,000 mcg total) into the muscle once. Vitamin B12 1 ml IM daily x 7 days then 1 ml IM weekly x 4 weeks then 1 ml IM monthly x 1 year. 1 mL 0  . desvenlafaxine (PRISTIQ) 100 MG 24 hr tablet Take 100 mg by mouth daily.    . furosemide (LASIX) 40 MG tablet Take 40 mg by mouth.    . Insulin Glargine (TOUJEO SOLOSTAR) 300 UNIT/ML SOPN Inject into the skin.    Marland Kitchen L-Arginine 500 MG CAPS Take by mouth.    . Liraglutide (VICTOZA) 18 MG/3ML SOPN Inject into the skin.    . Lutein 40 MG CAPS Take by mouth.    . Lutein 6 MG TABS Take by mouth.    . LYSINE PO Take 1,000 mg by mouth daily.    . metFORMIN (GLUCOPHAGE) 1000 MG tablet Take 1,000 mg by mouth 2 (two) times daily with a meal.    . oxycodone (OXY-IR) 5 MG capsule Take 5 mg by mouth every 4 (four) hours as needed.     No current facility-administered medications on file prior to visit.   Assessment:  Drugs sorted by system:  Neurologic/Psychologic: desvenlafaxine  Cardiovascular: atorvastatin, furosemide  Pulmonary/Allergy: none  Gastrointestinal: none  Endocrine: allopurinol, metformin,  Toujeo, victoza  Renal: none  Topical: clotrimazole  Pain: oxycodone  Vitamins/Minerals: cyanocobalamin  Infectious Diseases:  Miscellaneous: arginine, bilberry, cinnamon, lutein, lysine   Duplications in therapy: none Gaps in therapy: none Medications to avoid in the elderly: none Drug interactions: none Other issues noted: none  Plan: 1.  No issues noted.  I will close the case to pharmacy since no issues were noted.  I will be happy to assist in the future if any pharmacy needs arise.   Deanne Coffer, PharmD, [place] [phone]

## 2015-11-09 ENCOUNTER — Other Ambulatory Visit: Payer: Self-pay

## 2015-11-09 NOTE — Patient Outreach (Signed)
Follow up call:  Placed follow up call to patient. Spoke with Johnathan Wright ( consent obtained)  Who reports that patient is not interested in [facility] case management.  Reports that patient is getting Vitamin B12 shots.  Reports patient has lost 30 pounds on his diet and with new medications. Reports patient is very happy with weight loss.    Inquired about interest in [facility] program and patient has decided he does not have goals to work on is not interested in [facility] program. Report recent CBG readings in the 80's.  PLAN: Will close case as patient has declined services. Will notify MD and will send a case closure letter with my contact information to patient for future reference.  Tomasa Rand, RN, BSN, CEN [facility] [employer] [phone]

## 2015-12-06 DIAGNOSIS — D333 Benign neoplasm of cranial nerves: Secondary | ICD-10-CM | POA: Diagnosis not present

## 2015-12-27 ENCOUNTER — Ambulatory Visit: Payer: Commercial Managed Care - HMO | Admitting: Neurology

## 2015-12-27 DIAGNOSIS — Z029 Encounter for administrative examinations, unspecified: Secondary | ICD-10-CM

## 2015-12-31 ENCOUNTER — Ambulatory Visit: Payer: Commercial Managed Care - HMO | Admitting: Neurology

## 2016-01-03 ENCOUNTER — Ambulatory Visit (INDEPENDENT_AMBULATORY_CARE_PROVIDER_SITE_OTHER): Payer: Commercial Managed Care - HMO | Admitting: Podiatry

## 2016-01-03 ENCOUNTER — Encounter: Payer: Self-pay | Admitting: Podiatry

## 2016-01-03 ENCOUNTER — Ambulatory Visit (INDEPENDENT_AMBULATORY_CARE_PROVIDER_SITE_OTHER): Payer: Commercial Managed Care - HMO

## 2016-01-03 VITALS — BP 114/67 | HR 96 | Resp 16

## 2016-01-03 DIAGNOSIS — M79609 Pain in unspecified limb: Secondary | ICD-10-CM

## 2016-01-03 DIAGNOSIS — L89891 Pressure ulcer of other site, stage 1: Secondary | ICD-10-CM | POA: Diagnosis not present

## 2016-01-03 DIAGNOSIS — R52 Pain, unspecified: Secondary | ICD-10-CM

## 2016-01-03 DIAGNOSIS — L97512 Non-pressure chronic ulcer of other part of right foot with fat layer exposed: Secondary | ICD-10-CM

## 2016-01-03 DIAGNOSIS — Z794 Long term (current) use of insulin: Secondary | ICD-10-CM

## 2016-01-03 DIAGNOSIS — B351 Tinea unguium: Secondary | ICD-10-CM | POA: Diagnosis not present

## 2016-01-03 DIAGNOSIS — E1140 Type 2 diabetes mellitus with diabetic neuropathy, unspecified: Secondary | ICD-10-CM

## 2016-01-03 DIAGNOSIS — M79673 Pain in unspecified foot: Secondary | ICD-10-CM

## 2016-01-03 NOTE — Progress Notes (Signed)
   Subjective:    Patient ID: Johnathan Wright, male    DOB: 1946/06/30, 70 y.o.   MRN: [id number]  HPI this 70 year old male presents the office with chief complaint of an open ulcerated big toe Right foot. Patient states that this ulcer has been open and under treatment for over 2 years. He states he initially went to the wound care at [facility] for treatment of the ulcer, but stopped going d ue to finances. He then says he spent the next months being treated by Dr. Gretta Arab who treated him with debridement and off weightbearing. He says Dr. Gretta Arab told him that there was nothing else that he could do to help close the ulcer and that the only solution h e offered was amputation of the right toe. Therefore, the patient presents to my office with this open ulcer with no drainage or infection for my evaluation and treatment    Review of Systems  All other systems reviewed and are negative.      Objective:   Physical Exam GENERAL APPEARANCE: Alert, conversant. Appropriately groomed. No acute distress.  VASCULAR: Pedal pulses are  palpable at  [facility] and PT bilateral.  Capillary refill time is immediate to all digits,  Increased temperature both feet. Severe leg venous stasis both legs and feet.  NEUROLOGIC: sensation is absent to 5.07 monofilament at 5/5 sites bilateral.  Light touch is absent  bilateral, Muscle strength normal.   DERMATOLOGIC: Venous stasis discoloration on dorsum of both feet with severe swelling both feet.Marland Kitchen  Ulcer 15 x 25 mm noted right hallux medial plantar aspect.  Good granulation tissue noted at the site of the ulcer.  No Malodor or drainage.  No redness or swelling at site of ulcer.   NAILS  Thick disfigured discolored nails both feet.         Assessment & Plan:  Diabetic ulcer right hallux.   Onychomycosis  Diabetes with neuropathy.   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  Severe rearfoot arthritis noted.  Neosporin/DSD applied.   Told this patient  that I do not have any special treatment for the ulcer that has been open for 2 years. I told him after x-rays were taken that there might be an infectious process occurring but could not be sure without obtaining an MRI. I told him I was concerned that the ulcer has been open for 2 years and I am always concerned about infections entering the foot through the ulcers. I told him that an amputation of his toe may not be a bad consideration. Therefore, I referred him to Dr. Cannon Kettle for her evaluation and possible scheduling for amputation right hallux. Additionally, he did say Dr. Gretta Arab would perform the amputation and maybe he should be referred back to him.   Gardiner Barefoot DPM

## 2016-01-06 ENCOUNTER — Ambulatory Visit: Payer: Commercial Managed Care - HMO | Admitting: Sports Medicine

## 2016-01-17 DIAGNOSIS — M545 Low back pain: Secondary | ICD-10-CM | POA: Diagnosis not present

## 2016-01-17 DIAGNOSIS — B356 Tinea cruris: Secondary | ICD-10-CM | POA: Diagnosis not present

## 2016-02-25 DIAGNOSIS — I10 Essential (primary) hypertension: Secondary | ICD-10-CM | POA: Diagnosis not present

## 2016-02-25 DIAGNOSIS — D649 Anemia, unspecified: Secondary | ICD-10-CM | POA: Diagnosis not present

## 2016-02-25 DIAGNOSIS — R51 Headache: Secondary | ICD-10-CM | POA: Diagnosis not present

## 2016-02-25 DIAGNOSIS — R531 Weakness: Secondary | ICD-10-CM | POA: Diagnosis not present

## 2016-02-25 DIAGNOSIS — S91109A Unspecified open wound of unspecified toe(s) without damage to nail, initial encounter: Secondary | ICD-10-CM | POA: Diagnosis not present

## 2016-02-25 DIAGNOSIS — E782 Mixed hyperlipidemia: Secondary | ICD-10-CM | POA: Diagnosis not present

## 2016-02-25 DIAGNOSIS — E1142 Type 2 diabetes mellitus with diabetic polyneuropathy: Secondary | ICD-10-CM | POA: Diagnosis not present

## 2016-02-25 DIAGNOSIS — Z6841 Body Mass Index (BMI) 40.0 and over, adult: Secondary | ICD-10-CM | POA: Diagnosis not present

## 2016-04-24 DIAGNOSIS — D333 Benign neoplasm of cranial nerves: Secondary | ICD-10-CM | POA: Diagnosis not present

## 2016-04-24 DIAGNOSIS — G910 Communicating hydrocephalus: Secondary | ICD-10-CM | POA: Diagnosis not present

## 2016-04-26 DIAGNOSIS — G911 Obstructive hydrocephalus: Secondary | ICD-10-CM | POA: Insufficient documentation

## 2016-05-02 DIAGNOSIS — G910 Communicating hydrocephalus: Secondary | ICD-10-CM | POA: Insufficient documentation

## 2016-05-03 DIAGNOSIS — E119 Type 2 diabetes mellitus without complications: Secondary | ICD-10-CM | POA: Diagnosis not present

## 2016-05-03 DIAGNOSIS — Z794 Long term (current) use of insulin: Secondary | ICD-10-CM | POA: Diagnosis not present

## 2016-05-03 DIAGNOSIS — Z6841 Body Mass Index (BMI) 40.0 and over, adult: Secondary | ICD-10-CM | POA: Diagnosis not present

## 2016-05-03 DIAGNOSIS — Z01811 Encounter for preprocedural respiratory examination: Secondary | ICD-10-CM | POA: Diagnosis not present

## 2016-05-03 DIAGNOSIS — G4730 Sleep apnea, unspecified: Secondary | ICD-10-CM | POA: Diagnosis not present

## 2016-05-03 DIAGNOSIS — G479 Sleep disorder, unspecified: Secondary | ICD-10-CM | POA: Diagnosis not present

## 2016-05-03 DIAGNOSIS — G910 Communicating hydrocephalus: Secondary | ICD-10-CM | POA: Diagnosis not present

## 2016-05-03 DIAGNOSIS — I4892 Unspecified atrial flutter: Secondary | ICD-10-CM | POA: Diagnosis not present

## 2016-05-03 DIAGNOSIS — G4719 Other hypersomnia: Secondary | ICD-10-CM | POA: Diagnosis not present

## 2016-05-03 DIAGNOSIS — I447 Left bundle-branch block, unspecified: Secondary | ICD-10-CM | POA: Diagnosis not present

## 2016-05-03 DIAGNOSIS — I480 Paroxysmal atrial fibrillation: Secondary | ICD-10-CM | POA: Diagnosis not present

## 2016-05-03 DIAGNOSIS — I429 Cardiomyopathy, unspecified: Secondary | ICD-10-CM | POA: Diagnosis not present

## 2016-05-05 DIAGNOSIS — D333 Benign neoplasm of cranial nerves: Secondary | ICD-10-CM | POA: Diagnosis not present

## 2016-05-05 DIAGNOSIS — G6181 Chronic inflammatory demyelinating polyneuritis: Secondary | ICD-10-CM | POA: Diagnosis not present

## 2016-05-05 DIAGNOSIS — Z794 Long term (current) use of insulin: Secondary | ICD-10-CM | POA: Diagnosis not present

## 2016-05-05 DIAGNOSIS — G910 Communicating hydrocephalus: Secondary | ICD-10-CM | POA: Diagnosis not present

## 2016-05-05 DIAGNOSIS — G4730 Sleep apnea, unspecified: Secondary | ICD-10-CM | POA: Diagnosis not present

## 2016-05-05 DIAGNOSIS — E119 Type 2 diabetes mellitus without complications: Secondary | ICD-10-CM | POA: Diagnosis not present

## 2016-05-05 DIAGNOSIS — Z6841 Body Mass Index (BMI) 40.0 and over, adult: Secondary | ICD-10-CM | POA: Diagnosis not present

## 2016-05-05 DIAGNOSIS — G4733 Obstructive sleep apnea (adult) (pediatric): Secondary | ICD-10-CM | POA: Diagnosis not present

## 2016-05-06 DIAGNOSIS — G4730 Sleep apnea, unspecified: Secondary | ICD-10-CM | POA: Diagnosis not present

## 2016-05-06 DIAGNOSIS — Z794 Long term (current) use of insulin: Secondary | ICD-10-CM | POA: Diagnosis not present

## 2016-05-06 DIAGNOSIS — D333 Benign neoplasm of cranial nerves: Secondary | ICD-10-CM | POA: Diagnosis not present

## 2016-05-06 DIAGNOSIS — G6181 Chronic inflammatory demyelinating polyneuritis: Secondary | ICD-10-CM | POA: Diagnosis not present

## 2016-05-06 DIAGNOSIS — G4733 Obstructive sleep apnea (adult) (pediatric): Secondary | ICD-10-CM | POA: Diagnosis not present

## 2016-05-06 DIAGNOSIS — Z982 Presence of cerebrospinal fluid drainage device: Secondary | ICD-10-CM | POA: Diagnosis not present

## 2016-05-06 DIAGNOSIS — G910 Communicating hydrocephalus: Secondary | ICD-10-CM | POA: Diagnosis not present

## 2016-05-06 DIAGNOSIS — Z6841 Body Mass Index (BMI) 40.0 and over, adult: Secondary | ICD-10-CM | POA: Diagnosis not present

## 2016-05-06 DIAGNOSIS — E119 Type 2 diabetes mellitus without complications: Secondary | ICD-10-CM | POA: Diagnosis not present

## 2016-05-08 ENCOUNTER — Ambulatory Visit: Payer: Commercial Managed Care - HMO | Admitting: Podiatry

## 2016-05-15 ENCOUNTER — Ambulatory Visit (INDEPENDENT_AMBULATORY_CARE_PROVIDER_SITE_OTHER): Payer: Commercial Managed Care - HMO | Admitting: Podiatry

## 2016-05-15 DIAGNOSIS — Z794 Long term (current) use of insulin: Secondary | ICD-10-CM

## 2016-05-15 DIAGNOSIS — L97512 Non-pressure chronic ulcer of other part of right foot with fat layer exposed: Secondary | ICD-10-CM

## 2016-05-15 DIAGNOSIS — B351 Tinea unguium: Secondary | ICD-10-CM | POA: Diagnosis not present

## 2016-05-15 DIAGNOSIS — E1140 Type 2 diabetes mellitus with diabetic neuropathy, unspecified: Secondary | ICD-10-CM

## 2016-05-15 DIAGNOSIS — R52 Pain, unspecified: Secondary | ICD-10-CM

## 2016-05-15 DIAGNOSIS — M79676 Pain in unspecified toe(s): Secondary | ICD-10-CM

## 2016-05-15 DIAGNOSIS — M79609 Pain in unspecified limb: Secondary | ICD-10-CM

## 2016-05-15 NOTE — Progress Notes (Signed)
This patient presents the office for preventative foot care services and treatment of his long thick nails. He states that the nails are painful walking and wearing his shoes. He also has a chronic ulcer on the big toe of the right foot that has been treated by many specialists and he continues to be open and drain. He presents the office today for an evaluation and treatment of his feet   GENERAL APPEARANCE: Alert, conversant. Appropriately groomed. No acute distress.  VASCULAR: Pedal pulses are  palpable at  [facility] and PT bilateral.  Capillary refill time is immediate to all digits,  Normal temperature gradient.  Severe leg venous stasis both legs. NEUROLOGIC: sensation is normal to 5.07 monofilament at 5/5 sites bilateral.  Light touch is intact bilateral, Muscle strength normal.  MUSCULOSKELETAL: acceptable muscle strength, tone and stability bilateral.  Intrinsic muscluature intact bilateral.  Rectus appearance of foot and digits noted bilateral.   DERMATOLOGIC: Ulcer right hallux measures 13 mm x 20 mm.  Normal granulation tissue right hallux.  Mo malodor noted. NAILS  Thick disfigured discolored nails both feet.    Pain due to onychomycosis  Diabetic ulcer right hallux.     Debridement and grinding of long thick painful nails. Debridement of the outer rim of diabetic ulcer right hallux measured. The ulcer and found it has actually gotten to an smaller since his last visit to provided. The ulcer and bandaged with a dry sterile dressing. Told the patient that Dr. Cannon Kettle is adept  at working on ulcers and recommended he make an appointment with Dr. Cannon Kettle as he leaves today   Gardiner Barefoot DPM

## 2016-05-16 DIAGNOSIS — Z5189 Encounter for other specified aftercare: Secondary | ICD-10-CM | POA: Diagnosis not present

## 2016-05-16 DIAGNOSIS — Z4802 Encounter for removal of sutures: Secondary | ICD-10-CM | POA: Diagnosis not present

## 2016-06-08 ENCOUNTER — Ambulatory Visit: Payer: Commercial Managed Care - HMO | Admitting: Sports Medicine

## 2016-07-10 DIAGNOSIS — D3611 Benign neoplasm of peripheral nerves and autonomic nervous system of face, head, and neck: Secondary | ICD-10-CM | POA: Diagnosis not present

## 2016-07-10 DIAGNOSIS — Z4541 Encounter for adjustment and management of cerebrospinal fluid drainage device: Secondary | ICD-10-CM | POA: Diagnosis not present

## 2016-07-10 DIAGNOSIS — Z510 Encounter for antineoplastic radiation therapy: Secondary | ICD-10-CM | POA: Diagnosis not present

## 2016-07-10 DIAGNOSIS — G910 Communicating hydrocephalus: Secondary | ICD-10-CM | POA: Diagnosis not present

## 2016-07-10 DIAGNOSIS — R197 Diarrhea, unspecified: Secondary | ICD-10-CM | POA: Diagnosis not present

## 2016-07-10 DIAGNOSIS — D3610 Benign neoplasm of peripheral nerves and autonomic nervous system, unspecified: Secondary | ICD-10-CM | POA: Diagnosis not present

## 2016-07-10 DIAGNOSIS — D333 Benign neoplasm of cranial nerves: Secondary | ICD-10-CM | POA: Diagnosis not present

## 2016-07-11 DIAGNOSIS — Z982 Presence of cerebrospinal fluid drainage device: Secondary | ICD-10-CM | POA: Diagnosis not present

## 2016-07-11 DIAGNOSIS — D3610 Benign neoplasm of peripheral nerves and autonomic nervous system, unspecified: Secondary | ICD-10-CM | POA: Diagnosis not present

## 2016-07-11 DIAGNOSIS — Z510 Encounter for antineoplastic radiation therapy: Secondary | ICD-10-CM | POA: Diagnosis not present

## 2016-07-11 DIAGNOSIS — G910 Communicating hydrocephalus: Secondary | ICD-10-CM | POA: Diagnosis not present

## 2016-07-11 DIAGNOSIS — D333 Benign neoplasm of cranial nerves: Secondary | ICD-10-CM | POA: Diagnosis not present

## 2016-08-01 DIAGNOSIS — R269 Unspecified abnormalities of gait and mobility: Secondary | ICD-10-CM | POA: Diagnosis not present

## 2016-08-01 DIAGNOSIS — G910 Communicating hydrocephalus: Secondary | ICD-10-CM | POA: Diagnosis not present

## 2016-08-01 DIAGNOSIS — J320 Chronic maxillary sinusitis: Secondary | ICD-10-CM | POA: Diagnosis not present

## 2016-08-01 DIAGNOSIS — J321 Chronic frontal sinusitis: Secondary | ICD-10-CM | POA: Diagnosis not present

## 2016-08-01 DIAGNOSIS — Z9889 Other specified postprocedural states: Secondary | ICD-10-CM | POA: Diagnosis not present

## 2016-08-01 DIAGNOSIS — R296 Repeated falls: Secondary | ICD-10-CM | POA: Diagnosis not present

## 2016-08-01 DIAGNOSIS — G9389 Other specified disorders of brain: Secondary | ICD-10-CM | POA: Diagnosis not present

## 2016-08-01 DIAGNOSIS — J322 Chronic ethmoidal sinusitis: Secondary | ICD-10-CM | POA: Diagnosis not present

## 2016-08-01 DIAGNOSIS — Z982 Presence of cerebrospinal fluid drainage device: Secondary | ICD-10-CM | POA: Diagnosis not present

## 2016-08-07 DIAGNOSIS — Z982 Presence of cerebrospinal fluid drainage device: Secondary | ICD-10-CM | POA: Diagnosis not present

## 2016-08-07 DIAGNOSIS — G910 Communicating hydrocephalus: Secondary | ICD-10-CM | POA: Diagnosis not present

## 2016-08-07 DIAGNOSIS — D333 Benign neoplasm of cranial nerves: Secondary | ICD-10-CM | POA: Diagnosis not present

## 2016-08-14 DIAGNOSIS — Z510 Encounter for antineoplastic radiation therapy: Secondary | ICD-10-CM | POA: Diagnosis not present

## 2016-08-14 DIAGNOSIS — D3610 Benign neoplasm of peripheral nerves and autonomic nervous system, unspecified: Secondary | ICD-10-CM | POA: Diagnosis not present

## 2016-08-14 DIAGNOSIS — D333 Benign neoplasm of cranial nerves: Secondary | ICD-10-CM | POA: Diagnosis not present

## 2016-08-15 DIAGNOSIS — D3610 Benign neoplasm of peripheral nerves and autonomic nervous system, unspecified: Secondary | ICD-10-CM | POA: Diagnosis not present

## 2016-08-15 DIAGNOSIS — Z510 Encounter for antineoplastic radiation therapy: Secondary | ICD-10-CM | POA: Diagnosis not present

## 2016-08-15 DIAGNOSIS — D333 Benign neoplasm of cranial nerves: Secondary | ICD-10-CM | POA: Diagnosis not present

## 2016-08-16 DIAGNOSIS — Z510 Encounter for antineoplastic radiation therapy: Secondary | ICD-10-CM | POA: Diagnosis not present

## 2016-08-16 DIAGNOSIS — D333 Benign neoplasm of cranial nerves: Secondary | ICD-10-CM | POA: Diagnosis not present

## 2016-08-17 DIAGNOSIS — Z510 Encounter for antineoplastic radiation therapy: Secondary | ICD-10-CM | POA: Diagnosis not present

## 2016-08-17 DIAGNOSIS — D333 Benign neoplasm of cranial nerves: Secondary | ICD-10-CM | POA: Diagnosis not present

## 2016-08-18 DIAGNOSIS — D333 Benign neoplasm of cranial nerves: Secondary | ICD-10-CM | POA: Diagnosis not present

## 2016-08-18 DIAGNOSIS — Z510 Encounter for antineoplastic radiation therapy: Secondary | ICD-10-CM | POA: Diagnosis not present

## 2016-09-24 DIAGNOSIS — R05 Cough: Secondary | ICD-10-CM | POA: Diagnosis not present

## 2016-09-24 DIAGNOSIS — Z982 Presence of cerebrospinal fluid drainage device: Secondary | ICD-10-CM | POA: Diagnosis not present

## 2016-09-24 DIAGNOSIS — I491 Atrial premature depolarization: Secondary | ICD-10-CM | POA: Diagnosis not present

## 2016-09-24 DIAGNOSIS — E11621 Type 2 diabetes mellitus with foot ulcer: Secondary | ICD-10-CM | POA: Diagnosis not present

## 2016-09-24 DIAGNOSIS — I480 Paroxysmal atrial fibrillation: Secondary | ICD-10-CM | POA: Diagnosis not present

## 2016-09-24 DIAGNOSIS — R509 Fever, unspecified: Secondary | ICD-10-CM | POA: Diagnosis not present

## 2016-09-24 DIAGNOSIS — K5900 Constipation, unspecified: Secondary | ICD-10-CM | POA: Diagnosis not present

## 2016-09-24 DIAGNOSIS — J189 Pneumonia, unspecified organism: Secondary | ICD-10-CM | POA: Diagnosis not present

## 2016-09-24 DIAGNOSIS — A419 Sepsis, unspecified organism: Secondary | ICD-10-CM | POA: Diagnosis not present

## 2016-09-24 DIAGNOSIS — Z9889 Other specified postprocedural states: Secondary | ICD-10-CM | POA: Diagnosis not present

## 2016-09-24 DIAGNOSIS — R0902 Hypoxemia: Secondary | ICD-10-CM | POA: Diagnosis not present

## 2016-09-24 DIAGNOSIS — R112 Nausea with vomiting, unspecified: Secondary | ICD-10-CM | POA: Diagnosis not present

## 2016-09-24 DIAGNOSIS — I872 Venous insufficiency (chronic) (peripheral): Secondary | ICD-10-CM | POA: Diagnosis not present

## 2016-09-24 DIAGNOSIS — Z7409 Other reduced mobility: Secondary | ICD-10-CM | POA: Diagnosis not present

## 2016-09-24 DIAGNOSIS — A408 Other streptococcal sepsis: Secondary | ICD-10-CM | POA: Diagnosis not present

## 2016-09-24 DIAGNOSIS — K5909 Other constipation: Secondary | ICD-10-CM | POA: Diagnosis not present

## 2016-09-24 DIAGNOSIS — I5032 Chronic diastolic (congestive) heart failure: Secondary | ICD-10-CM | POA: Diagnosis not present

## 2016-09-24 DIAGNOSIS — I83215 Varicose veins of right lower extremity with both ulcer other part of foot and inflammation: Secondary | ICD-10-CM | POA: Diagnosis not present

## 2016-09-24 DIAGNOSIS — E662 Morbid (severe) obesity with alveolar hypoventilation: Secondary | ICD-10-CM | POA: Diagnosis not present

## 2016-09-24 DIAGNOSIS — G918 Other hydrocephalus: Secondary | ICD-10-CM | POA: Diagnosis not present

## 2016-09-24 DIAGNOSIS — L97519 Non-pressure chronic ulcer of other part of right foot with unspecified severity: Secondary | ICD-10-CM | POA: Diagnosis not present

## 2016-09-24 DIAGNOSIS — R262 Difficulty in walking, not elsewhere classified: Secondary | ICD-10-CM | POA: Diagnosis not present

## 2016-09-24 DIAGNOSIS — Z9181 History of falling: Secondary | ICD-10-CM | POA: Diagnosis not present

## 2016-09-24 DIAGNOSIS — G6181 Chronic inflammatory demyelinating polyneuritis: Secondary | ICD-10-CM | POA: Diagnosis not present

## 2016-09-24 DIAGNOSIS — I4892 Unspecified atrial flutter: Secondary | ICD-10-CM | POA: Diagnosis not present

## 2016-09-24 DIAGNOSIS — Z7901 Long term (current) use of anticoagulants: Secondary | ICD-10-CM | POA: Diagnosis not present

## 2016-09-24 DIAGNOSIS — I5022 Chronic systolic (congestive) heart failure: Secondary | ICD-10-CM | POA: Diagnosis not present

## 2016-09-24 DIAGNOSIS — R000 Tachycardia, unspecified: Secondary | ICD-10-CM | POA: Diagnosis not present

## 2016-09-24 DIAGNOSIS — Z9114 Patient's other noncompliance with medication regimen: Secondary | ICD-10-CM | POA: Diagnosis not present

## 2016-09-24 DIAGNOSIS — R5381 Other malaise: Secondary | ICD-10-CM | POA: Diagnosis not present

## 2016-09-24 DIAGNOSIS — I4589 Other specified conduction disorders: Secondary | ICD-10-CM | POA: Diagnosis not present

## 2016-09-24 DIAGNOSIS — A401 Sepsis due to streptococcus, group B: Secondary | ICD-10-CM | POA: Diagnosis not present

## 2016-09-24 DIAGNOSIS — Z6841 Body Mass Index (BMI) 40.0 and over, adult: Secondary | ICD-10-CM | POA: Diagnosis not present

## 2016-09-24 DIAGNOSIS — L97518 Non-pressure chronic ulcer of other part of right foot with other specified severity: Secondary | ICD-10-CM | POA: Diagnosis not present

## 2016-09-24 DIAGNOSIS — Z794 Long term (current) use of insulin: Secondary | ICD-10-CM | POA: Diagnosis not present

## 2016-09-24 DIAGNOSIS — D3610 Benign neoplasm of peripheral nerves and autonomic nervous system, unspecified: Secondary | ICD-10-CM | POA: Diagnosis not present

## 2016-09-24 DIAGNOSIS — G9389 Other specified disorders of brain: Secondary | ICD-10-CM | POA: Diagnosis not present

## 2016-09-24 DIAGNOSIS — M6281 Muscle weakness (generalized): Secondary | ICD-10-CM | POA: Diagnosis not present

## 2016-09-24 DIAGNOSIS — E119 Type 2 diabetes mellitus without complications: Secondary | ICD-10-CM | POA: Diagnosis not present

## 2016-09-24 DIAGNOSIS — R935 Abnormal findings on diagnostic imaging of other abdominal regions, including retroperitoneum: Secondary | ICD-10-CM | POA: Diagnosis not present

## 2016-09-24 DIAGNOSIS — I447 Left bundle-branch block, unspecified: Secondary | ICD-10-CM | POA: Diagnosis not present

## 2016-09-24 DIAGNOSIS — R0602 Shortness of breath: Secondary | ICD-10-CM | POA: Diagnosis not present

## 2016-09-24 DIAGNOSIS — R531 Weakness: Secondary | ICD-10-CM | POA: Diagnosis not present

## 2016-09-24 DIAGNOSIS — G910 Communicating hydrocephalus: Secondary | ICD-10-CM | POA: Diagnosis not present

## 2016-09-24 DIAGNOSIS — Z95828 Presence of other vascular implants and grafts: Secondary | ICD-10-CM | POA: Diagnosis not present

## 2016-09-24 DIAGNOSIS — R918 Other nonspecific abnormal finding of lung field: Secondary | ICD-10-CM | POA: Diagnosis not present

## 2016-09-24 DIAGNOSIS — E785 Hyperlipidemia, unspecified: Secondary | ICD-10-CM | POA: Diagnosis not present

## 2016-09-24 DIAGNOSIS — E1142 Type 2 diabetes mellitus with diabetic polyneuropathy: Secondary | ICD-10-CM | POA: Diagnosis not present

## 2016-09-24 DIAGNOSIS — D333 Benign neoplasm of cranial nerves: Secondary | ICD-10-CM | POA: Diagnosis not present

## 2016-09-24 DIAGNOSIS — A400 Sepsis due to streptococcus, group A: Secondary | ICD-10-CM | POA: Diagnosis not present

## 2016-09-25 DIAGNOSIS — I5022 Chronic systolic (congestive) heart failure: Secondary | ICD-10-CM | POA: Insufficient documentation

## 2016-10-06 DIAGNOSIS — M6281 Muscle weakness (generalized): Secondary | ICD-10-CM | POA: Diagnosis not present

## 2016-10-06 DIAGNOSIS — G910 Communicating hydrocephalus: Secondary | ICD-10-CM | POA: Diagnosis not present

## 2016-10-06 DIAGNOSIS — I5022 Chronic systolic (congestive) heart failure: Secondary | ICD-10-CM | POA: Diagnosis not present

## 2016-10-06 DIAGNOSIS — I5032 Chronic diastolic (congestive) heart failure: Secondary | ICD-10-CM | POA: Diagnosis not present

## 2016-10-06 DIAGNOSIS — I480 Paroxysmal atrial fibrillation: Secondary | ICD-10-CM | POA: Diagnosis not present

## 2016-10-06 DIAGNOSIS — A419 Sepsis, unspecified organism: Secondary | ICD-10-CM | POA: Diagnosis not present

## 2016-10-06 DIAGNOSIS — Z7409 Other reduced mobility: Secondary | ICD-10-CM | POA: Diagnosis not present

## 2016-10-06 DIAGNOSIS — G6181 Chronic inflammatory demyelinating polyneuritis: Secondary | ICD-10-CM | POA: Diagnosis not present

## 2016-10-06 DIAGNOSIS — D333 Benign neoplasm of cranial nerves: Secondary | ICD-10-CM | POA: Diagnosis not present

## 2016-10-06 DIAGNOSIS — A401 Sepsis due to streptococcus, group B: Secondary | ICD-10-CM | POA: Diagnosis not present

## 2016-10-06 DIAGNOSIS — G9389 Other specified disorders of brain: Secondary | ICD-10-CM | POA: Diagnosis not present

## 2016-10-06 DIAGNOSIS — E782 Mixed hyperlipidemia: Secondary | ICD-10-CM | POA: Diagnosis not present

## 2016-10-06 DIAGNOSIS — M109 Gout, unspecified: Secondary | ICD-10-CM | POA: Diagnosis not present

## 2016-10-06 DIAGNOSIS — R262 Difficulty in walking, not elsewhere classified: Secondary | ICD-10-CM | POA: Diagnosis not present

## 2016-10-06 DIAGNOSIS — E119 Type 2 diabetes mellitus without complications: Secondary | ICD-10-CM | POA: Diagnosis not present

## 2016-10-06 DIAGNOSIS — Z9181 History of falling: Secondary | ICD-10-CM | POA: Diagnosis not present

## 2016-10-06 DIAGNOSIS — R531 Weakness: Secondary | ICD-10-CM | POA: Diagnosis not present

## 2016-10-09 DIAGNOSIS — G6181 Chronic inflammatory demyelinating polyneuritis: Secondary | ICD-10-CM | POA: Diagnosis not present

## 2016-10-09 DIAGNOSIS — A419 Sepsis, unspecified organism: Secondary | ICD-10-CM | POA: Diagnosis not present

## 2016-10-09 DIAGNOSIS — E119 Type 2 diabetes mellitus without complications: Secondary | ICD-10-CM | POA: Diagnosis not present

## 2016-10-09 DIAGNOSIS — I480 Paroxysmal atrial fibrillation: Secondary | ICD-10-CM | POA: Diagnosis not present

## 2016-10-10 DIAGNOSIS — M109 Gout, unspecified: Secondary | ICD-10-CM | POA: Diagnosis not present

## 2016-10-10 DIAGNOSIS — I5032 Chronic diastolic (congestive) heart failure: Secondary | ICD-10-CM | POA: Diagnosis not present

## 2016-10-10 DIAGNOSIS — E782 Mixed hyperlipidemia: Secondary | ICD-10-CM | POA: Diagnosis not present

## 2016-10-10 DIAGNOSIS — E119 Type 2 diabetes mellitus without complications: Secondary | ICD-10-CM | POA: Diagnosis not present

## 2016-10-13 ENCOUNTER — Other Ambulatory Visit: Payer: Self-pay | Admitting: *Deleted

## 2016-10-13 NOTE — Patient Outreach (Signed)
Communication with Meredith Mody, Discharge planner at facility regarding any [facility] management post-acute discharge needs for patient. Therapy projected date 10/27/16 for discharge, patient will discharge to home. Patient previously contacted regarding [facility] care management and refused services.  Requested that discharge planner let RNCM know of any [facility] care management needs and RNCM will follow up as indicated. [name]. Myalee Stengel, RN, BSN, [place] Care Coordination [phone]

## 2016-10-19 DIAGNOSIS — I480 Paroxysmal atrial fibrillation: Secondary | ICD-10-CM | POA: Diagnosis not present

## 2016-10-19 DIAGNOSIS — E119 Type 2 diabetes mellitus without complications: Secondary | ICD-10-CM | POA: Diagnosis not present

## 2016-10-19 DIAGNOSIS — I5022 Chronic systolic (congestive) heart failure: Secondary | ICD-10-CM | POA: Diagnosis not present

## 2016-10-19 DIAGNOSIS — G6181 Chronic inflammatory demyelinating polyneuritis: Secondary | ICD-10-CM | POA: Diagnosis not present

## 2016-10-24 DIAGNOSIS — G6181 Chronic inflammatory demyelinating polyneuritis: Secondary | ICD-10-CM | POA: Diagnosis not present

## 2016-10-24 DIAGNOSIS — E782 Mixed hyperlipidemia: Secondary | ICD-10-CM | POA: Diagnosis not present

## 2016-10-24 DIAGNOSIS — E119 Type 2 diabetes mellitus without complications: Secondary | ICD-10-CM | POA: Diagnosis not present

## 2016-10-24 DIAGNOSIS — I480 Paroxysmal atrial fibrillation: Secondary | ICD-10-CM | POA: Diagnosis not present

## 2016-10-27 ENCOUNTER — Other Ambulatory Visit: Payer: Self-pay | Admitting: *Deleted

## 2016-10-27 DIAGNOSIS — I480 Paroxysmal atrial fibrillation: Secondary | ICD-10-CM | POA: Diagnosis not present

## 2016-10-27 DIAGNOSIS — I5022 Chronic systolic (congestive) heart failure: Secondary | ICD-10-CM | POA: Diagnosis not present

## 2016-10-27 DIAGNOSIS — G6181 Chronic inflammatory demyelinating polyneuritis: Secondary | ICD-10-CM | POA: Diagnosis not present

## 2016-10-27 DIAGNOSIS — E119 Type 2 diabetes mellitus without complications: Secondary | ICD-10-CM | POA: Diagnosis not present

## 2016-10-27 NOTE — Patient Outreach (Signed)
[facility]) Care Management  10/27/2016  Johnathan Wright [birth year] [id number]   Follow up visit with [name], discharge planner at facility.  She states patient will discharge 10/27/16. He has 24 hour caregiver, [name]. Also, Patient will get Home care through [facility].  No [facility] care management needs assessed.   Plan to sign off. [name]. [name], RN, BSN, CCM  [facility] coordinator [phone]

## 2016-11-01 DIAGNOSIS — G910 Communicating hydrocephalus: Secondary | ICD-10-CM | POA: Diagnosis not present

## 2016-11-01 DIAGNOSIS — Z982 Presence of cerebrospinal fluid drainage device: Secondary | ICD-10-CM | POA: Diagnosis not present

## 2016-11-01 DIAGNOSIS — F3341 Major depressive disorder, recurrent, in partial remission: Secondary | ICD-10-CM | POA: Diagnosis not present

## 2016-11-01 DIAGNOSIS — L97511 Non-pressure chronic ulcer of other part of right foot limited to breakdown of skin: Secondary | ICD-10-CM | POA: Diagnosis not present

## 2016-11-01 DIAGNOSIS — I480 Paroxysmal atrial fibrillation: Secondary | ICD-10-CM | POA: Diagnosis not present

## 2016-11-01 DIAGNOSIS — G6181 Chronic inflammatory demyelinating polyneuritis: Secondary | ICD-10-CM | POA: Diagnosis not present

## 2016-11-01 DIAGNOSIS — G4733 Obstructive sleep apnea (adult) (pediatric): Secondary | ICD-10-CM | POA: Diagnosis not present

## 2016-11-01 DIAGNOSIS — I5022 Chronic systolic (congestive) heart failure: Secondary | ICD-10-CM | POA: Diagnosis not present

## 2016-11-01 DIAGNOSIS — E11621 Type 2 diabetes mellitus with foot ulcer: Secondary | ICD-10-CM | POA: Diagnosis not present

## 2016-11-02 DIAGNOSIS — I5022 Chronic systolic (congestive) heart failure: Secondary | ICD-10-CM | POA: Diagnosis not present

## 2016-11-02 DIAGNOSIS — G4733 Obstructive sleep apnea (adult) (pediatric): Secondary | ICD-10-CM | POA: Diagnosis not present

## 2016-11-02 DIAGNOSIS — E11621 Type 2 diabetes mellitus with foot ulcer: Secondary | ICD-10-CM | POA: Diagnosis not present

## 2016-11-02 DIAGNOSIS — L97511 Non-pressure chronic ulcer of other part of right foot limited to breakdown of skin: Secondary | ICD-10-CM | POA: Diagnosis not present

## 2016-11-02 DIAGNOSIS — G910 Communicating hydrocephalus: Secondary | ICD-10-CM | POA: Diagnosis not present

## 2016-11-02 DIAGNOSIS — Z982 Presence of cerebrospinal fluid drainage device: Secondary | ICD-10-CM | POA: Diagnosis not present

## 2016-11-02 DIAGNOSIS — F3341 Major depressive disorder, recurrent, in partial remission: Secondary | ICD-10-CM | POA: Diagnosis not present

## 2016-11-02 DIAGNOSIS — I480 Paroxysmal atrial fibrillation: Secondary | ICD-10-CM | POA: Diagnosis not present

## 2016-11-02 DIAGNOSIS — G6181 Chronic inflammatory demyelinating polyneuritis: Secondary | ICD-10-CM | POA: Diagnosis not present

## 2016-11-03 DIAGNOSIS — F3341 Major depressive disorder, recurrent, in partial remission: Secondary | ICD-10-CM | POA: Diagnosis not present

## 2016-11-03 DIAGNOSIS — G4733 Obstructive sleep apnea (adult) (pediatric): Secondary | ICD-10-CM | POA: Diagnosis not present

## 2016-11-03 DIAGNOSIS — G6181 Chronic inflammatory demyelinating polyneuritis: Secondary | ICD-10-CM | POA: Diagnosis not present

## 2016-11-03 DIAGNOSIS — I5022 Chronic systolic (congestive) heart failure: Secondary | ICD-10-CM | POA: Diagnosis not present

## 2016-11-03 DIAGNOSIS — Z982 Presence of cerebrospinal fluid drainage device: Secondary | ICD-10-CM | POA: Diagnosis not present

## 2016-11-03 DIAGNOSIS — I480 Paroxysmal atrial fibrillation: Secondary | ICD-10-CM | POA: Diagnosis not present

## 2016-11-03 DIAGNOSIS — E11621 Type 2 diabetes mellitus with foot ulcer: Secondary | ICD-10-CM | POA: Diagnosis not present

## 2016-11-03 DIAGNOSIS — G910 Communicating hydrocephalus: Secondary | ICD-10-CM | POA: Diagnosis not present

## 2016-11-03 DIAGNOSIS — L97511 Non-pressure chronic ulcer of other part of right foot limited to breakdown of skin: Secondary | ICD-10-CM | POA: Diagnosis not present

## 2016-11-06 DIAGNOSIS — R51 Headache: Secondary | ICD-10-CM | POA: Diagnosis not present

## 2016-11-06 DIAGNOSIS — G912 (Idiopathic) normal pressure hydrocephalus: Secondary | ICD-10-CM | POA: Diagnosis not present

## 2016-11-06 DIAGNOSIS — I4891 Unspecified atrial fibrillation: Secondary | ICD-10-CM | POA: Diagnosis not present

## 2016-11-06 DIAGNOSIS — F320 Major depressive disorder, single episode, mild: Secondary | ICD-10-CM | POA: Diagnosis not present

## 2016-11-06 DIAGNOSIS — E1121 Type 2 diabetes mellitus with diabetic nephropathy: Secondary | ICD-10-CM | POA: Diagnosis not present

## 2016-11-06 DIAGNOSIS — I10 Essential (primary) hypertension: Secondary | ICD-10-CM | POA: Diagnosis not present

## 2016-11-06 DIAGNOSIS — I482 Chronic atrial fibrillation: Secondary | ICD-10-CM | POA: Diagnosis not present

## 2016-11-06 DIAGNOSIS — A414 Sepsis due to anaerobes: Secondary | ICD-10-CM | POA: Diagnosis not present

## 2016-11-07 DIAGNOSIS — F3341 Major depressive disorder, recurrent, in partial remission: Secondary | ICD-10-CM | POA: Diagnosis not present

## 2016-11-07 DIAGNOSIS — G4733 Obstructive sleep apnea (adult) (pediatric): Secondary | ICD-10-CM | POA: Diagnosis not present

## 2016-11-07 DIAGNOSIS — G6181 Chronic inflammatory demyelinating polyneuritis: Secondary | ICD-10-CM | POA: Diagnosis not present

## 2016-11-07 DIAGNOSIS — I480 Paroxysmal atrial fibrillation: Secondary | ICD-10-CM | POA: Diagnosis not present

## 2016-11-07 DIAGNOSIS — Z982 Presence of cerebrospinal fluid drainage device: Secondary | ICD-10-CM | POA: Diagnosis not present

## 2016-11-07 DIAGNOSIS — G910 Communicating hydrocephalus: Secondary | ICD-10-CM | POA: Diagnosis not present

## 2016-11-07 DIAGNOSIS — L97511 Non-pressure chronic ulcer of other part of right foot limited to breakdown of skin: Secondary | ICD-10-CM | POA: Diagnosis not present

## 2016-11-07 DIAGNOSIS — I5022 Chronic systolic (congestive) heart failure: Secondary | ICD-10-CM | POA: Diagnosis not present

## 2016-11-07 DIAGNOSIS — E11621 Type 2 diabetes mellitus with foot ulcer: Secondary | ICD-10-CM | POA: Diagnosis not present

## 2016-11-10 DIAGNOSIS — I5022 Chronic systolic (congestive) heart failure: Secondary | ICD-10-CM | POA: Diagnosis not present

## 2016-11-10 DIAGNOSIS — E11621 Type 2 diabetes mellitus with foot ulcer: Secondary | ICD-10-CM | POA: Diagnosis not present

## 2016-11-10 DIAGNOSIS — G6181 Chronic inflammatory demyelinating polyneuritis: Secondary | ICD-10-CM | POA: Diagnosis not present

## 2016-11-10 DIAGNOSIS — G4733 Obstructive sleep apnea (adult) (pediatric): Secondary | ICD-10-CM | POA: Diagnosis not present

## 2016-11-10 DIAGNOSIS — F3341 Major depressive disorder, recurrent, in partial remission: Secondary | ICD-10-CM | POA: Diagnosis not present

## 2016-11-10 DIAGNOSIS — L97511 Non-pressure chronic ulcer of other part of right foot limited to breakdown of skin: Secondary | ICD-10-CM | POA: Diagnosis not present

## 2016-11-10 DIAGNOSIS — Z982 Presence of cerebrospinal fluid drainage device: Secondary | ICD-10-CM | POA: Diagnosis not present

## 2016-11-10 DIAGNOSIS — I480 Paroxysmal atrial fibrillation: Secondary | ICD-10-CM | POA: Diagnosis not present

## 2016-11-10 DIAGNOSIS — G910 Communicating hydrocephalus: Secondary | ICD-10-CM | POA: Diagnosis not present

## 2016-11-11 DIAGNOSIS — G4733 Obstructive sleep apnea (adult) (pediatric): Secondary | ICD-10-CM | POA: Diagnosis not present

## 2016-11-11 DIAGNOSIS — G6181 Chronic inflammatory demyelinating polyneuritis: Secondary | ICD-10-CM | POA: Diagnosis not present

## 2016-11-11 DIAGNOSIS — E11621 Type 2 diabetes mellitus with foot ulcer: Secondary | ICD-10-CM | POA: Diagnosis not present

## 2016-11-11 DIAGNOSIS — I480 Paroxysmal atrial fibrillation: Secondary | ICD-10-CM | POA: Diagnosis not present

## 2016-11-11 DIAGNOSIS — I5022 Chronic systolic (congestive) heart failure: Secondary | ICD-10-CM | POA: Diagnosis not present

## 2016-11-11 DIAGNOSIS — L97511 Non-pressure chronic ulcer of other part of right foot limited to breakdown of skin: Secondary | ICD-10-CM | POA: Diagnosis not present

## 2016-11-11 DIAGNOSIS — G910 Communicating hydrocephalus: Secondary | ICD-10-CM | POA: Diagnosis not present

## 2016-11-11 DIAGNOSIS — F3341 Major depressive disorder, recurrent, in partial remission: Secondary | ICD-10-CM | POA: Diagnosis not present

## 2016-11-11 DIAGNOSIS — Z982 Presence of cerebrospinal fluid drainage device: Secondary | ICD-10-CM | POA: Diagnosis not present

## 2016-11-13 DIAGNOSIS — L97511 Non-pressure chronic ulcer of other part of right foot limited to breakdown of skin: Secondary | ICD-10-CM | POA: Diagnosis not present

## 2016-11-13 DIAGNOSIS — G6181 Chronic inflammatory demyelinating polyneuritis: Secondary | ICD-10-CM | POA: Diagnosis not present

## 2016-11-13 DIAGNOSIS — Z982 Presence of cerebrospinal fluid drainage device: Secondary | ICD-10-CM | POA: Diagnosis not present

## 2016-11-13 DIAGNOSIS — F3341 Major depressive disorder, recurrent, in partial remission: Secondary | ICD-10-CM | POA: Diagnosis not present

## 2016-11-13 DIAGNOSIS — G910 Communicating hydrocephalus: Secondary | ICD-10-CM | POA: Diagnosis not present

## 2016-11-13 DIAGNOSIS — I5022 Chronic systolic (congestive) heart failure: Secondary | ICD-10-CM | POA: Diagnosis not present

## 2016-11-13 DIAGNOSIS — I480 Paroxysmal atrial fibrillation: Secondary | ICD-10-CM | POA: Diagnosis not present

## 2016-11-13 DIAGNOSIS — G4733 Obstructive sleep apnea (adult) (pediatric): Secondary | ICD-10-CM | POA: Diagnosis not present

## 2016-11-13 DIAGNOSIS — E11621 Type 2 diabetes mellitus with foot ulcer: Secondary | ICD-10-CM | POA: Diagnosis not present

## 2016-11-14 DIAGNOSIS — I480 Paroxysmal atrial fibrillation: Secondary | ICD-10-CM | POA: Diagnosis not present

## 2016-11-14 DIAGNOSIS — L97511 Non-pressure chronic ulcer of other part of right foot limited to breakdown of skin: Secondary | ICD-10-CM | POA: Diagnosis not present

## 2016-11-14 DIAGNOSIS — E11621 Type 2 diabetes mellitus with foot ulcer: Secondary | ICD-10-CM | POA: Diagnosis not present

## 2016-11-14 DIAGNOSIS — F3341 Major depressive disorder, recurrent, in partial remission: Secondary | ICD-10-CM | POA: Diagnosis not present

## 2016-11-14 DIAGNOSIS — Z982 Presence of cerebrospinal fluid drainage device: Secondary | ICD-10-CM | POA: Diagnosis not present

## 2016-11-14 DIAGNOSIS — G910 Communicating hydrocephalus: Secondary | ICD-10-CM | POA: Diagnosis not present

## 2016-11-14 DIAGNOSIS — G4733 Obstructive sleep apnea (adult) (pediatric): Secondary | ICD-10-CM | POA: Diagnosis not present

## 2016-11-14 DIAGNOSIS — G6181 Chronic inflammatory demyelinating polyneuritis: Secondary | ICD-10-CM | POA: Diagnosis not present

## 2016-11-14 DIAGNOSIS — I5022 Chronic systolic (congestive) heart failure: Secondary | ICD-10-CM | POA: Diagnosis not present

## 2016-11-15 DIAGNOSIS — G6181 Chronic inflammatory demyelinating polyneuritis: Secondary | ICD-10-CM | POA: Diagnosis not present

## 2016-11-15 DIAGNOSIS — I5022 Chronic systolic (congestive) heart failure: Secondary | ICD-10-CM | POA: Diagnosis not present

## 2016-11-15 DIAGNOSIS — G910 Communicating hydrocephalus: Secondary | ICD-10-CM | POA: Diagnosis not present

## 2016-11-15 DIAGNOSIS — E11621 Type 2 diabetes mellitus with foot ulcer: Secondary | ICD-10-CM | POA: Diagnosis not present

## 2016-11-15 DIAGNOSIS — Z982 Presence of cerebrospinal fluid drainage device: Secondary | ICD-10-CM | POA: Diagnosis not present

## 2016-11-15 DIAGNOSIS — G4733 Obstructive sleep apnea (adult) (pediatric): Secondary | ICD-10-CM | POA: Diagnosis not present

## 2016-11-15 DIAGNOSIS — L97511 Non-pressure chronic ulcer of other part of right foot limited to breakdown of skin: Secondary | ICD-10-CM | POA: Diagnosis not present

## 2016-11-15 DIAGNOSIS — F3341 Major depressive disorder, recurrent, in partial remission: Secondary | ICD-10-CM | POA: Diagnosis not present

## 2016-11-15 DIAGNOSIS — I480 Paroxysmal atrial fibrillation: Secondary | ICD-10-CM | POA: Diagnosis not present

## 2016-11-16 DIAGNOSIS — Z982 Presence of cerebrospinal fluid drainage device: Secondary | ICD-10-CM | POA: Diagnosis not present

## 2016-11-16 DIAGNOSIS — I5022 Chronic systolic (congestive) heart failure: Secondary | ICD-10-CM | POA: Diagnosis not present

## 2016-11-16 DIAGNOSIS — G910 Communicating hydrocephalus: Secondary | ICD-10-CM | POA: Diagnosis not present

## 2016-11-16 DIAGNOSIS — G4733 Obstructive sleep apnea (adult) (pediatric): Secondary | ICD-10-CM | POA: Diagnosis not present

## 2016-11-16 DIAGNOSIS — I480 Paroxysmal atrial fibrillation: Secondary | ICD-10-CM | POA: Diagnosis not present

## 2016-11-16 DIAGNOSIS — G6181 Chronic inflammatory demyelinating polyneuritis: Secondary | ICD-10-CM | POA: Diagnosis not present

## 2016-11-16 DIAGNOSIS — F3341 Major depressive disorder, recurrent, in partial remission: Secondary | ICD-10-CM | POA: Diagnosis not present

## 2016-11-16 DIAGNOSIS — E11621 Type 2 diabetes mellitus with foot ulcer: Secondary | ICD-10-CM | POA: Diagnosis not present

## 2016-11-16 DIAGNOSIS — L97511 Non-pressure chronic ulcer of other part of right foot limited to breakdown of skin: Secondary | ICD-10-CM | POA: Diagnosis not present

## 2016-11-20 DIAGNOSIS — I5022 Chronic systolic (congestive) heart failure: Secondary | ICD-10-CM | POA: Diagnosis not present

## 2016-11-20 DIAGNOSIS — L97511 Non-pressure chronic ulcer of other part of right foot limited to breakdown of skin: Secondary | ICD-10-CM | POA: Diagnosis not present

## 2016-11-20 DIAGNOSIS — G910 Communicating hydrocephalus: Secondary | ICD-10-CM | POA: Diagnosis not present

## 2016-11-20 DIAGNOSIS — G6181 Chronic inflammatory demyelinating polyneuritis: Secondary | ICD-10-CM | POA: Diagnosis not present

## 2016-11-20 DIAGNOSIS — I480 Paroxysmal atrial fibrillation: Secondary | ICD-10-CM | POA: Diagnosis not present

## 2016-11-20 DIAGNOSIS — E11621 Type 2 diabetes mellitus with foot ulcer: Secondary | ICD-10-CM | POA: Diagnosis not present

## 2016-11-20 DIAGNOSIS — F3341 Major depressive disorder, recurrent, in partial remission: Secondary | ICD-10-CM | POA: Diagnosis not present

## 2016-11-20 DIAGNOSIS — G4733 Obstructive sleep apnea (adult) (pediatric): Secondary | ICD-10-CM | POA: Diagnosis not present

## 2016-11-20 DIAGNOSIS — Z982 Presence of cerebrospinal fluid drainage device: Secondary | ICD-10-CM | POA: Diagnosis not present

## 2016-11-21 DIAGNOSIS — F3341 Major depressive disorder, recurrent, in partial remission: Secondary | ICD-10-CM | POA: Diagnosis not present

## 2016-11-21 DIAGNOSIS — L97511 Non-pressure chronic ulcer of other part of right foot limited to breakdown of skin: Secondary | ICD-10-CM | POA: Diagnosis not present

## 2016-11-21 DIAGNOSIS — E11621 Type 2 diabetes mellitus with foot ulcer: Secondary | ICD-10-CM | POA: Diagnosis not present

## 2016-11-21 DIAGNOSIS — I480 Paroxysmal atrial fibrillation: Secondary | ICD-10-CM | POA: Diagnosis not present

## 2016-11-21 DIAGNOSIS — G4733 Obstructive sleep apnea (adult) (pediatric): Secondary | ICD-10-CM | POA: Diagnosis not present

## 2016-11-21 DIAGNOSIS — Z982 Presence of cerebrospinal fluid drainage device: Secondary | ICD-10-CM | POA: Diagnosis not present

## 2016-11-21 DIAGNOSIS — G6181 Chronic inflammatory demyelinating polyneuritis: Secondary | ICD-10-CM | POA: Diagnosis not present

## 2016-11-21 DIAGNOSIS — I5022 Chronic systolic (congestive) heart failure: Secondary | ICD-10-CM | POA: Diagnosis not present

## 2016-11-21 DIAGNOSIS — G910 Communicating hydrocephalus: Secondary | ICD-10-CM | POA: Diagnosis not present

## 2016-11-22 DIAGNOSIS — I5022 Chronic systolic (congestive) heart failure: Secondary | ICD-10-CM | POA: Diagnosis not present

## 2016-11-22 DIAGNOSIS — G4733 Obstructive sleep apnea (adult) (pediatric): Secondary | ICD-10-CM | POA: Diagnosis not present

## 2016-11-22 DIAGNOSIS — G6181 Chronic inflammatory demyelinating polyneuritis: Secondary | ICD-10-CM | POA: Diagnosis not present

## 2016-11-22 DIAGNOSIS — G910 Communicating hydrocephalus: Secondary | ICD-10-CM | POA: Diagnosis not present

## 2016-11-22 DIAGNOSIS — E11621 Type 2 diabetes mellitus with foot ulcer: Secondary | ICD-10-CM | POA: Diagnosis not present

## 2016-11-22 DIAGNOSIS — F3341 Major depressive disorder, recurrent, in partial remission: Secondary | ICD-10-CM | POA: Diagnosis not present

## 2016-11-22 DIAGNOSIS — I480 Paroxysmal atrial fibrillation: Secondary | ICD-10-CM | POA: Diagnosis not present

## 2016-11-22 DIAGNOSIS — L97511 Non-pressure chronic ulcer of other part of right foot limited to breakdown of skin: Secondary | ICD-10-CM | POA: Diagnosis not present

## 2016-11-22 DIAGNOSIS — Z982 Presence of cerebrospinal fluid drainage device: Secondary | ICD-10-CM | POA: Diagnosis not present

## 2016-11-24 DIAGNOSIS — I5022 Chronic systolic (congestive) heart failure: Secondary | ICD-10-CM | POA: Diagnosis not present

## 2016-11-24 DIAGNOSIS — F3341 Major depressive disorder, recurrent, in partial remission: Secondary | ICD-10-CM | POA: Diagnosis not present

## 2016-11-24 DIAGNOSIS — E11621 Type 2 diabetes mellitus with foot ulcer: Secondary | ICD-10-CM | POA: Diagnosis not present

## 2016-11-24 DIAGNOSIS — G4733 Obstructive sleep apnea (adult) (pediatric): Secondary | ICD-10-CM | POA: Diagnosis not present

## 2016-11-24 DIAGNOSIS — G6181 Chronic inflammatory demyelinating polyneuritis: Secondary | ICD-10-CM | POA: Diagnosis not present

## 2016-11-24 DIAGNOSIS — Z982 Presence of cerebrospinal fluid drainage device: Secondary | ICD-10-CM | POA: Diagnosis not present

## 2016-11-24 DIAGNOSIS — L97511 Non-pressure chronic ulcer of other part of right foot limited to breakdown of skin: Secondary | ICD-10-CM | POA: Diagnosis not present

## 2016-11-24 DIAGNOSIS — G910 Communicating hydrocephalus: Secondary | ICD-10-CM | POA: Diagnosis not present

## 2016-11-24 DIAGNOSIS — I480 Paroxysmal atrial fibrillation: Secondary | ICD-10-CM | POA: Diagnosis not present

## 2016-11-27 DIAGNOSIS — G910 Communicating hydrocephalus: Secondary | ICD-10-CM | POA: Diagnosis not present

## 2016-11-27 DIAGNOSIS — Z982 Presence of cerebrospinal fluid drainage device: Secondary | ICD-10-CM | POA: Diagnosis not present

## 2016-11-27 DIAGNOSIS — I480 Paroxysmal atrial fibrillation: Secondary | ICD-10-CM | POA: Diagnosis not present

## 2016-11-27 DIAGNOSIS — E11621 Type 2 diabetes mellitus with foot ulcer: Secondary | ICD-10-CM | POA: Diagnosis not present

## 2016-11-27 DIAGNOSIS — G4733 Obstructive sleep apnea (adult) (pediatric): Secondary | ICD-10-CM | POA: Diagnosis not present

## 2016-11-27 DIAGNOSIS — I5022 Chronic systolic (congestive) heart failure: Secondary | ICD-10-CM | POA: Diagnosis not present

## 2016-11-27 DIAGNOSIS — F3341 Major depressive disorder, recurrent, in partial remission: Secondary | ICD-10-CM | POA: Diagnosis not present

## 2016-11-27 DIAGNOSIS — G6181 Chronic inflammatory demyelinating polyneuritis: Secondary | ICD-10-CM | POA: Diagnosis not present

## 2016-11-27 DIAGNOSIS — L97511 Non-pressure chronic ulcer of other part of right foot limited to breakdown of skin: Secondary | ICD-10-CM | POA: Diagnosis not present

## 2016-11-28 DIAGNOSIS — G910 Communicating hydrocephalus: Secondary | ICD-10-CM | POA: Diagnosis not present

## 2016-11-28 DIAGNOSIS — E11621 Type 2 diabetes mellitus with foot ulcer: Secondary | ICD-10-CM | POA: Diagnosis not present

## 2016-11-28 DIAGNOSIS — Z982 Presence of cerebrospinal fluid drainage device: Secondary | ICD-10-CM | POA: Diagnosis not present

## 2016-11-28 DIAGNOSIS — F3341 Major depressive disorder, recurrent, in partial remission: Secondary | ICD-10-CM | POA: Diagnosis not present

## 2016-11-28 DIAGNOSIS — G6181 Chronic inflammatory demyelinating polyneuritis: Secondary | ICD-10-CM | POA: Diagnosis not present

## 2016-11-28 DIAGNOSIS — L97511 Non-pressure chronic ulcer of other part of right foot limited to breakdown of skin: Secondary | ICD-10-CM | POA: Diagnosis not present

## 2016-11-28 DIAGNOSIS — G4733 Obstructive sleep apnea (adult) (pediatric): Secondary | ICD-10-CM | POA: Diagnosis not present

## 2016-11-28 DIAGNOSIS — I480 Paroxysmal atrial fibrillation: Secondary | ICD-10-CM | POA: Diagnosis not present

## 2016-11-28 DIAGNOSIS — I5022 Chronic systolic (congestive) heart failure: Secondary | ICD-10-CM | POA: Diagnosis not present

## 2016-12-05 DIAGNOSIS — F3341 Major depressive disorder, recurrent, in partial remission: Secondary | ICD-10-CM | POA: Diagnosis not present

## 2016-12-05 DIAGNOSIS — G4733 Obstructive sleep apnea (adult) (pediatric): Secondary | ICD-10-CM | POA: Diagnosis not present

## 2016-12-05 DIAGNOSIS — L97511 Non-pressure chronic ulcer of other part of right foot limited to breakdown of skin: Secondary | ICD-10-CM | POA: Diagnosis not present

## 2016-12-05 DIAGNOSIS — G6181 Chronic inflammatory demyelinating polyneuritis: Secondary | ICD-10-CM | POA: Diagnosis not present

## 2016-12-05 DIAGNOSIS — I480 Paroxysmal atrial fibrillation: Secondary | ICD-10-CM | POA: Diagnosis not present

## 2016-12-05 DIAGNOSIS — G910 Communicating hydrocephalus: Secondary | ICD-10-CM | POA: Diagnosis not present

## 2016-12-05 DIAGNOSIS — E11621 Type 2 diabetes mellitus with foot ulcer: Secondary | ICD-10-CM | POA: Diagnosis not present

## 2016-12-05 DIAGNOSIS — Z982 Presence of cerebrospinal fluid drainage device: Secondary | ICD-10-CM | POA: Diagnosis not present

## 2016-12-05 DIAGNOSIS — I5022 Chronic systolic (congestive) heart failure: Secondary | ICD-10-CM | POA: Diagnosis not present

## 2016-12-08 DIAGNOSIS — I5022 Chronic systolic (congestive) heart failure: Secondary | ICD-10-CM | POA: Diagnosis not present

## 2016-12-08 DIAGNOSIS — I480 Paroxysmal atrial fibrillation: Secondary | ICD-10-CM | POA: Diagnosis not present

## 2016-12-08 DIAGNOSIS — G4733 Obstructive sleep apnea (adult) (pediatric): Secondary | ICD-10-CM | POA: Diagnosis not present

## 2016-12-08 DIAGNOSIS — G6181 Chronic inflammatory demyelinating polyneuritis: Secondary | ICD-10-CM | POA: Diagnosis not present

## 2016-12-08 DIAGNOSIS — Z982 Presence of cerebrospinal fluid drainage device: Secondary | ICD-10-CM | POA: Diagnosis not present

## 2016-12-08 DIAGNOSIS — E11621 Type 2 diabetes mellitus with foot ulcer: Secondary | ICD-10-CM | POA: Diagnosis not present

## 2016-12-08 DIAGNOSIS — G910 Communicating hydrocephalus: Secondary | ICD-10-CM | POA: Diagnosis not present

## 2016-12-08 DIAGNOSIS — F3341 Major depressive disorder, recurrent, in partial remission: Secondary | ICD-10-CM | POA: Diagnosis not present

## 2016-12-08 DIAGNOSIS — L97511 Non-pressure chronic ulcer of other part of right foot limited to breakdown of skin: Secondary | ICD-10-CM | POA: Diagnosis not present

## 2016-12-12 DIAGNOSIS — M25551 Pain in right hip: Secondary | ICD-10-CM | POA: Diagnosis not present

## 2016-12-12 DIAGNOSIS — M47896 Other spondylosis, lumbar region: Secondary | ICD-10-CM | POA: Diagnosis not present

## 2016-12-23 DIAGNOSIS — S43004A Unspecified dislocation of right shoulder joint, initial encounter: Secondary | ICD-10-CM | POA: Diagnosis not present

## 2016-12-23 DIAGNOSIS — R296 Repeated falls: Secondary | ICD-10-CM | POA: Diagnosis not present

## 2016-12-23 DIAGNOSIS — R259 Unspecified abnormal involuntary movements: Secondary | ICD-10-CM | POA: Diagnosis not present

## 2016-12-23 DIAGNOSIS — S43014A Anterior dislocation of right humerus, initial encounter: Secondary | ICD-10-CM | POA: Diagnosis not present

## 2016-12-23 DIAGNOSIS — M79609 Pain in unspecified limb: Secondary | ICD-10-CM | POA: Diagnosis not present

## 2016-12-23 DIAGNOSIS — S43101A Unspecified dislocation of right acromioclavicular joint, initial encounter: Secondary | ICD-10-CM | POA: Diagnosis not present

## 2017-01-02 DIAGNOSIS — S22080A Wedge compression fracture of T11-T12 vertebra, initial encounter for closed fracture: Secondary | ICD-10-CM | POA: Diagnosis not present

## 2017-01-09 DIAGNOSIS — G894 Chronic pain syndrome: Secondary | ICD-10-CM | POA: Diagnosis not present

## 2017-01-09 DIAGNOSIS — E1142 Type 2 diabetes mellitus with diabetic polyneuropathy: Secondary | ICD-10-CM | POA: Diagnosis not present

## 2017-01-09 DIAGNOSIS — R531 Weakness: Secondary | ICD-10-CM | POA: Diagnosis not present

## 2017-02-07 DIAGNOSIS — E782 Mixed hyperlipidemia: Secondary | ICD-10-CM | POA: Diagnosis not present

## 2017-02-07 DIAGNOSIS — I482 Chronic atrial fibrillation: Secondary | ICD-10-CM | POA: Diagnosis not present

## 2017-02-07 DIAGNOSIS — Z6841 Body Mass Index (BMI) 40.0 and over, adult: Secondary | ICD-10-CM | POA: Diagnosis not present

## 2017-02-07 DIAGNOSIS — M545 Low back pain: Secondary | ICD-10-CM | POA: Diagnosis not present

## 2017-02-07 DIAGNOSIS — E1142 Type 2 diabetes mellitus with diabetic polyneuropathy: Secondary | ICD-10-CM | POA: Diagnosis not present

## 2017-02-07 DIAGNOSIS — I10 Essential (primary) hypertension: Secondary | ICD-10-CM | POA: Diagnosis not present

## 2017-02-07 DIAGNOSIS — F331 Major depressive disorder, recurrent, moderate: Secondary | ICD-10-CM | POA: Diagnosis not present

## 2017-02-07 DIAGNOSIS — L97515 Non-pressure chronic ulcer of other part of right foot with muscle involvement without evidence of necrosis: Secondary | ICD-10-CM | POA: Diagnosis not present

## 2017-02-07 DIAGNOSIS — E1121 Type 2 diabetes mellitus with diabetic nephropathy: Secondary | ICD-10-CM | POA: Diagnosis not present

## 2017-02-28 DIAGNOSIS — L97515 Non-pressure chronic ulcer of other part of right foot with muscle involvement without evidence of necrosis: Secondary | ICD-10-CM | POA: Diagnosis not present

## 2017-02-28 DIAGNOSIS — Z872 Personal history of diseases of the skin and subcutaneous tissue: Secondary | ICD-10-CM | POA: Diagnosis not present

## 2017-02-28 DIAGNOSIS — F17210 Nicotine dependence, cigarettes, uncomplicated: Secondary | ICD-10-CM | POA: Diagnosis not present

## 2017-03-29 DIAGNOSIS — R109 Unspecified abdominal pain: Secondary | ICD-10-CM | POA: Diagnosis not present

## 2017-03-29 DIAGNOSIS — M1612 Unilateral primary osteoarthritis, left hip: Secondary | ICD-10-CM | POA: Diagnosis not present

## 2017-03-29 DIAGNOSIS — S7002XA Contusion of left hip, initial encounter: Secondary | ICD-10-CM | POA: Diagnosis not present

## 2017-03-29 DIAGNOSIS — S3991XA Unspecified injury of abdomen, initial encounter: Secondary | ICD-10-CM | POA: Diagnosis not present

## 2017-03-29 DIAGNOSIS — E119 Type 2 diabetes mellitus without complications: Secondary | ICD-10-CM | POA: Diagnosis not present

## 2017-03-29 DIAGNOSIS — S301XXA Contusion of abdominal wall, initial encounter: Secondary | ICD-10-CM | POA: Diagnosis not present

## 2017-05-15 DIAGNOSIS — I482 Chronic atrial fibrillation: Secondary | ICD-10-CM | POA: Diagnosis not present

## 2017-05-15 DIAGNOSIS — M545 Low back pain: Secondary | ICD-10-CM | POA: Diagnosis not present

## 2017-05-15 DIAGNOSIS — Z6841 Body Mass Index (BMI) 40.0 and over, adult: Secondary | ICD-10-CM | POA: Diagnosis not present

## 2017-05-15 DIAGNOSIS — E1121 Type 2 diabetes mellitus with diabetic nephropathy: Secondary | ICD-10-CM | POA: Diagnosis not present

## 2017-05-15 DIAGNOSIS — F331 Major depressive disorder, recurrent, moderate: Secondary | ICD-10-CM | POA: Diagnosis not present

## 2017-05-15 DIAGNOSIS — L97515 Non-pressure chronic ulcer of other part of right foot with muscle involvement without evidence of necrosis: Secondary | ICD-10-CM | POA: Diagnosis not present

## 2017-05-15 DIAGNOSIS — E1142 Type 2 diabetes mellitus with diabetic polyneuropathy: Secondary | ICD-10-CM | POA: Diagnosis not present

## 2017-05-15 DIAGNOSIS — M25551 Pain in right hip: Secondary | ICD-10-CM | POA: Diagnosis not present

## 2017-05-15 DIAGNOSIS — E782 Mixed hyperlipidemia: Secondary | ICD-10-CM | POA: Diagnosis not present

## 2017-05-16 DIAGNOSIS — M1611 Unilateral primary osteoarthritis, right hip: Secondary | ICD-10-CM | POA: Diagnosis not present

## 2017-05-16 DIAGNOSIS — M25551 Pain in right hip: Secondary | ICD-10-CM | POA: Diagnosis not present

## 2017-05-17 DIAGNOSIS — E1121 Type 2 diabetes mellitus with diabetic nephropathy: Secondary | ICD-10-CM | POA: Diagnosis not present

## 2017-05-17 DIAGNOSIS — I480 Paroxysmal atrial fibrillation: Secondary | ICD-10-CM | POA: Diagnosis not present

## 2017-05-17 DIAGNOSIS — G4733 Obstructive sleep apnea (adult) (pediatric): Secondary | ICD-10-CM | POA: Diagnosis not present

## 2017-05-17 DIAGNOSIS — G6181 Chronic inflammatory demyelinating polyneuritis: Secondary | ICD-10-CM | POA: Diagnosis not present

## 2017-05-17 DIAGNOSIS — F3341 Major depressive disorder, recurrent, in partial remission: Secondary | ICD-10-CM | POA: Diagnosis not present

## 2017-05-17 DIAGNOSIS — E1142 Type 2 diabetes mellitus with diabetic polyneuropathy: Secondary | ICD-10-CM | POA: Diagnosis not present

## 2017-05-17 DIAGNOSIS — E11621 Type 2 diabetes mellitus with foot ulcer: Secondary | ICD-10-CM | POA: Diagnosis not present

## 2017-05-17 DIAGNOSIS — L97515 Non-pressure chronic ulcer of other part of right foot with muscle involvement without evidence of necrosis: Secondary | ICD-10-CM | POA: Diagnosis not present

## 2017-05-17 DIAGNOSIS — I5022 Chronic systolic (congestive) heart failure: Secondary | ICD-10-CM | POA: Diagnosis not present

## 2017-05-22 DIAGNOSIS — L97515 Non-pressure chronic ulcer of other part of right foot with muscle involvement without evidence of necrosis: Secondary | ICD-10-CM | POA: Diagnosis not present

## 2017-05-22 DIAGNOSIS — E11621 Type 2 diabetes mellitus with foot ulcer: Secondary | ICD-10-CM | POA: Diagnosis not present

## 2017-05-22 DIAGNOSIS — I5022 Chronic systolic (congestive) heart failure: Secondary | ICD-10-CM | POA: Diagnosis not present

## 2017-05-22 DIAGNOSIS — E1142 Type 2 diabetes mellitus with diabetic polyneuropathy: Secondary | ICD-10-CM | POA: Diagnosis not present

## 2017-05-22 DIAGNOSIS — G6181 Chronic inflammatory demyelinating polyneuritis: Secondary | ICD-10-CM | POA: Diagnosis not present

## 2017-05-22 DIAGNOSIS — E1121 Type 2 diabetes mellitus with diabetic nephropathy: Secondary | ICD-10-CM | POA: Diagnosis not present

## 2017-05-22 DIAGNOSIS — G4733 Obstructive sleep apnea (adult) (pediatric): Secondary | ICD-10-CM | POA: Diagnosis not present

## 2017-05-22 DIAGNOSIS — F3341 Major depressive disorder, recurrent, in partial remission: Secondary | ICD-10-CM | POA: Diagnosis not present

## 2017-05-22 DIAGNOSIS — I480 Paroxysmal atrial fibrillation: Secondary | ICD-10-CM | POA: Diagnosis not present

## 2017-05-25 DIAGNOSIS — E1142 Type 2 diabetes mellitus with diabetic polyneuropathy: Secondary | ICD-10-CM | POA: Diagnosis not present

## 2017-05-25 DIAGNOSIS — I480 Paroxysmal atrial fibrillation: Secondary | ICD-10-CM | POA: Diagnosis not present

## 2017-05-25 DIAGNOSIS — I5022 Chronic systolic (congestive) heart failure: Secondary | ICD-10-CM | POA: Diagnosis not present

## 2017-05-25 DIAGNOSIS — E1121 Type 2 diabetes mellitus with diabetic nephropathy: Secondary | ICD-10-CM | POA: Diagnosis not present

## 2017-05-25 DIAGNOSIS — E119 Type 2 diabetes mellitus without complications: Secondary | ICD-10-CM | POA: Diagnosis not present

## 2017-05-25 DIAGNOSIS — F3341 Major depressive disorder, recurrent, in partial remission: Secondary | ICD-10-CM | POA: Diagnosis not present

## 2017-05-25 DIAGNOSIS — L97515 Non-pressure chronic ulcer of other part of right foot with muscle involvement without evidence of necrosis: Secondary | ICD-10-CM | POA: Diagnosis not present

## 2017-05-25 DIAGNOSIS — G4733 Obstructive sleep apnea (adult) (pediatric): Secondary | ICD-10-CM | POA: Diagnosis not present

## 2017-05-25 DIAGNOSIS — H25813 Combined forms of age-related cataract, bilateral: Secondary | ICD-10-CM | POA: Diagnosis not present

## 2017-05-25 DIAGNOSIS — E11621 Type 2 diabetes mellitus with foot ulcer: Secondary | ICD-10-CM | POA: Diagnosis not present

## 2017-05-25 DIAGNOSIS — G6181 Chronic inflammatory demyelinating polyneuritis: Secondary | ICD-10-CM | POA: Diagnosis not present

## 2017-05-28 DIAGNOSIS — M545 Low back pain: Secondary | ICD-10-CM | POA: Diagnosis not present

## 2017-05-28 DIAGNOSIS — E1142 Type 2 diabetes mellitus with diabetic polyneuropathy: Secondary | ICD-10-CM | POA: Diagnosis not present

## 2017-05-28 DIAGNOSIS — G6181 Chronic inflammatory demyelinating polyneuritis: Secondary | ICD-10-CM | POA: Diagnosis not present

## 2017-05-28 DIAGNOSIS — I5022 Chronic systolic (congestive) heart failure: Secondary | ICD-10-CM | POA: Diagnosis not present

## 2017-05-28 DIAGNOSIS — F3341 Major depressive disorder, recurrent, in partial remission: Secondary | ICD-10-CM | POA: Diagnosis not present

## 2017-05-28 DIAGNOSIS — E11621 Type 2 diabetes mellitus with foot ulcer: Secondary | ICD-10-CM | POA: Diagnosis not present

## 2017-05-28 DIAGNOSIS — E1121 Type 2 diabetes mellitus with diabetic nephropathy: Secondary | ICD-10-CM | POA: Diagnosis not present

## 2017-05-28 DIAGNOSIS — I480 Paroxysmal atrial fibrillation: Secondary | ICD-10-CM | POA: Diagnosis not present

## 2017-05-28 DIAGNOSIS — G4733 Obstructive sleep apnea (adult) (pediatric): Secondary | ICD-10-CM | POA: Diagnosis not present

## 2017-05-28 DIAGNOSIS — L97515 Non-pressure chronic ulcer of other part of right foot with muscle involvement without evidence of necrosis: Secondary | ICD-10-CM | POA: Diagnosis not present

## 2017-05-30 DIAGNOSIS — L97515 Non-pressure chronic ulcer of other part of right foot with muscle involvement without evidence of necrosis: Secondary | ICD-10-CM | POA: Diagnosis not present

## 2017-05-30 DIAGNOSIS — I5022 Chronic systolic (congestive) heart failure: Secondary | ICD-10-CM | POA: Diagnosis not present

## 2017-05-30 DIAGNOSIS — E1121 Type 2 diabetes mellitus with diabetic nephropathy: Secondary | ICD-10-CM | POA: Diagnosis not present

## 2017-05-30 DIAGNOSIS — F3341 Major depressive disorder, recurrent, in partial remission: Secondary | ICD-10-CM | POA: Diagnosis not present

## 2017-05-30 DIAGNOSIS — I480 Paroxysmal atrial fibrillation: Secondary | ICD-10-CM | POA: Diagnosis not present

## 2017-05-30 DIAGNOSIS — E1142 Type 2 diabetes mellitus with diabetic polyneuropathy: Secondary | ICD-10-CM | POA: Diagnosis not present

## 2017-05-30 DIAGNOSIS — G4733 Obstructive sleep apnea (adult) (pediatric): Secondary | ICD-10-CM | POA: Diagnosis not present

## 2017-05-30 DIAGNOSIS — E11621 Type 2 diabetes mellitus with foot ulcer: Secondary | ICD-10-CM | POA: Diagnosis not present

## 2017-05-30 DIAGNOSIS — G6181 Chronic inflammatory demyelinating polyneuritis: Secondary | ICD-10-CM | POA: Diagnosis not present

## 2017-05-31 ENCOUNTER — Ambulatory Visit: Payer: Commercial Managed Care - HMO | Admitting: Sports Medicine

## 2017-05-31 DIAGNOSIS — E1121 Type 2 diabetes mellitus with diabetic nephropathy: Secondary | ICD-10-CM | POA: Diagnosis not present

## 2017-05-31 DIAGNOSIS — L97515 Non-pressure chronic ulcer of other part of right foot with muscle involvement without evidence of necrosis: Secondary | ICD-10-CM | POA: Diagnosis not present

## 2017-05-31 DIAGNOSIS — E11621 Type 2 diabetes mellitus with foot ulcer: Secondary | ICD-10-CM | POA: Diagnosis not present

## 2017-06-01 DIAGNOSIS — G6181 Chronic inflammatory demyelinating polyneuritis: Secondary | ICD-10-CM | POA: Diagnosis not present

## 2017-06-01 DIAGNOSIS — I480 Paroxysmal atrial fibrillation: Secondary | ICD-10-CM | POA: Diagnosis not present

## 2017-06-01 DIAGNOSIS — E1142 Type 2 diabetes mellitus with diabetic polyneuropathy: Secondary | ICD-10-CM | POA: Diagnosis not present

## 2017-06-01 DIAGNOSIS — E11621 Type 2 diabetes mellitus with foot ulcer: Secondary | ICD-10-CM | POA: Diagnosis not present

## 2017-06-01 DIAGNOSIS — F3341 Major depressive disorder, recurrent, in partial remission: Secondary | ICD-10-CM | POA: Diagnosis not present

## 2017-06-01 DIAGNOSIS — I5022 Chronic systolic (congestive) heart failure: Secondary | ICD-10-CM | POA: Diagnosis not present

## 2017-06-01 DIAGNOSIS — L97515 Non-pressure chronic ulcer of other part of right foot with muscle involvement without evidence of necrosis: Secondary | ICD-10-CM | POA: Diagnosis not present

## 2017-06-01 DIAGNOSIS — E1121 Type 2 diabetes mellitus with diabetic nephropathy: Secondary | ICD-10-CM | POA: Diagnosis not present

## 2017-06-01 DIAGNOSIS — G4733 Obstructive sleep apnea (adult) (pediatric): Secondary | ICD-10-CM | POA: Diagnosis not present

## 2017-06-04 DIAGNOSIS — G4733 Obstructive sleep apnea (adult) (pediatric): Secondary | ICD-10-CM | POA: Diagnosis not present

## 2017-06-04 DIAGNOSIS — I5022 Chronic systolic (congestive) heart failure: Secondary | ICD-10-CM | POA: Diagnosis not present

## 2017-06-04 DIAGNOSIS — I480 Paroxysmal atrial fibrillation: Secondary | ICD-10-CM | POA: Diagnosis not present

## 2017-06-04 DIAGNOSIS — L97515 Non-pressure chronic ulcer of other part of right foot with muscle involvement without evidence of necrosis: Secondary | ICD-10-CM | POA: Diagnosis not present

## 2017-06-04 DIAGNOSIS — E1142 Type 2 diabetes mellitus with diabetic polyneuropathy: Secondary | ICD-10-CM | POA: Diagnosis not present

## 2017-06-04 DIAGNOSIS — F3341 Major depressive disorder, recurrent, in partial remission: Secondary | ICD-10-CM | POA: Diagnosis not present

## 2017-06-04 DIAGNOSIS — E1121 Type 2 diabetes mellitus with diabetic nephropathy: Secondary | ICD-10-CM | POA: Diagnosis not present

## 2017-06-04 DIAGNOSIS — E11621 Type 2 diabetes mellitus with foot ulcer: Secondary | ICD-10-CM | POA: Diagnosis not present

## 2017-06-04 DIAGNOSIS — G6181 Chronic inflammatory demyelinating polyneuritis: Secondary | ICD-10-CM | POA: Diagnosis not present

## 2017-06-06 DIAGNOSIS — E11621 Type 2 diabetes mellitus with foot ulcer: Secondary | ICD-10-CM | POA: Diagnosis not present

## 2017-06-06 DIAGNOSIS — E1142 Type 2 diabetes mellitus with diabetic polyneuropathy: Secondary | ICD-10-CM | POA: Diagnosis not present

## 2017-06-06 DIAGNOSIS — I5022 Chronic systolic (congestive) heart failure: Secondary | ICD-10-CM | POA: Diagnosis not present

## 2017-06-06 DIAGNOSIS — G4733 Obstructive sleep apnea (adult) (pediatric): Secondary | ICD-10-CM | POA: Diagnosis not present

## 2017-06-06 DIAGNOSIS — E1121 Type 2 diabetes mellitus with diabetic nephropathy: Secondary | ICD-10-CM | POA: Diagnosis not present

## 2017-06-06 DIAGNOSIS — G6181 Chronic inflammatory demyelinating polyneuritis: Secondary | ICD-10-CM | POA: Diagnosis not present

## 2017-06-06 DIAGNOSIS — L97515 Non-pressure chronic ulcer of other part of right foot with muscle involvement without evidence of necrosis: Secondary | ICD-10-CM | POA: Diagnosis not present

## 2017-06-06 DIAGNOSIS — F3341 Major depressive disorder, recurrent, in partial remission: Secondary | ICD-10-CM | POA: Diagnosis not present

## 2017-06-06 DIAGNOSIS — I480 Paroxysmal atrial fibrillation: Secondary | ICD-10-CM | POA: Diagnosis not present

## 2017-06-08 DIAGNOSIS — F3341 Major depressive disorder, recurrent, in partial remission: Secondary | ICD-10-CM | POA: Diagnosis not present

## 2017-06-08 DIAGNOSIS — I5022 Chronic systolic (congestive) heart failure: Secondary | ICD-10-CM | POA: Diagnosis not present

## 2017-06-08 DIAGNOSIS — E11621 Type 2 diabetes mellitus with foot ulcer: Secondary | ICD-10-CM | POA: Diagnosis not present

## 2017-06-08 DIAGNOSIS — I480 Paroxysmal atrial fibrillation: Secondary | ICD-10-CM | POA: Diagnosis not present

## 2017-06-08 DIAGNOSIS — L97515 Non-pressure chronic ulcer of other part of right foot with muscle involvement without evidence of necrosis: Secondary | ICD-10-CM | POA: Diagnosis not present

## 2017-06-08 DIAGNOSIS — E1121 Type 2 diabetes mellitus with diabetic nephropathy: Secondary | ICD-10-CM | POA: Diagnosis not present

## 2017-06-08 DIAGNOSIS — G6181 Chronic inflammatory demyelinating polyneuritis: Secondary | ICD-10-CM | POA: Diagnosis not present

## 2017-06-08 DIAGNOSIS — G4733 Obstructive sleep apnea (adult) (pediatric): Secondary | ICD-10-CM | POA: Diagnosis not present

## 2017-06-08 DIAGNOSIS — E1142 Type 2 diabetes mellitus with diabetic polyneuropathy: Secondary | ICD-10-CM | POA: Diagnosis not present

## 2017-06-11 DIAGNOSIS — I480 Paroxysmal atrial fibrillation: Secondary | ICD-10-CM | POA: Diagnosis not present

## 2017-06-11 DIAGNOSIS — L97515 Non-pressure chronic ulcer of other part of right foot with muscle involvement without evidence of necrosis: Secondary | ICD-10-CM | POA: Diagnosis not present

## 2017-06-11 DIAGNOSIS — F3341 Major depressive disorder, recurrent, in partial remission: Secondary | ICD-10-CM | POA: Diagnosis not present

## 2017-06-11 DIAGNOSIS — E1142 Type 2 diabetes mellitus with diabetic polyneuropathy: Secondary | ICD-10-CM | POA: Diagnosis not present

## 2017-06-11 DIAGNOSIS — E1121 Type 2 diabetes mellitus with diabetic nephropathy: Secondary | ICD-10-CM | POA: Diagnosis not present

## 2017-06-11 DIAGNOSIS — I5022 Chronic systolic (congestive) heart failure: Secondary | ICD-10-CM | POA: Diagnosis not present

## 2017-06-11 DIAGNOSIS — G4733 Obstructive sleep apnea (adult) (pediatric): Secondary | ICD-10-CM | POA: Diagnosis not present

## 2017-06-11 DIAGNOSIS — E11621 Type 2 diabetes mellitus with foot ulcer: Secondary | ICD-10-CM | POA: Diagnosis not present

## 2017-06-11 DIAGNOSIS — G6181 Chronic inflammatory demyelinating polyneuritis: Secondary | ICD-10-CM | POA: Diagnosis not present

## 2017-06-13 DIAGNOSIS — E1121 Type 2 diabetes mellitus with diabetic nephropathy: Secondary | ICD-10-CM | POA: Diagnosis not present

## 2017-06-13 DIAGNOSIS — G6181 Chronic inflammatory demyelinating polyneuritis: Secondary | ICD-10-CM | POA: Diagnosis not present

## 2017-06-13 DIAGNOSIS — G4733 Obstructive sleep apnea (adult) (pediatric): Secondary | ICD-10-CM | POA: Diagnosis not present

## 2017-06-13 DIAGNOSIS — E1142 Type 2 diabetes mellitus with diabetic polyneuropathy: Secondary | ICD-10-CM | POA: Diagnosis not present

## 2017-06-13 DIAGNOSIS — L97515 Non-pressure chronic ulcer of other part of right foot with muscle involvement without evidence of necrosis: Secondary | ICD-10-CM | POA: Diagnosis not present

## 2017-06-13 DIAGNOSIS — F3341 Major depressive disorder, recurrent, in partial remission: Secondary | ICD-10-CM | POA: Diagnosis not present

## 2017-06-13 DIAGNOSIS — E11621 Type 2 diabetes mellitus with foot ulcer: Secondary | ICD-10-CM | POA: Diagnosis not present

## 2017-06-13 DIAGNOSIS — I5022 Chronic systolic (congestive) heart failure: Secondary | ICD-10-CM | POA: Diagnosis not present

## 2017-06-13 DIAGNOSIS — I480 Paroxysmal atrial fibrillation: Secondary | ICD-10-CM | POA: Diagnosis not present

## 2017-06-13 NOTE — Addendum Note (Signed)
Addended by: Jiles Harold on: 06/13/2017 04:56 PM   Modules accepted: Miquel Dunn

## 2017-06-14 ENCOUNTER — Encounter (INDEPENDENT_AMBULATORY_CARE_PROVIDER_SITE_OTHER): Payer: Self-pay

## 2017-06-14 ENCOUNTER — Ambulatory Visit (INDEPENDENT_AMBULATORY_CARE_PROVIDER_SITE_OTHER): Payer: Medicare HMO | Admitting: Sports Medicine

## 2017-06-14 ENCOUNTER — Ambulatory Visit (INDEPENDENT_AMBULATORY_CARE_PROVIDER_SITE_OTHER): Payer: Medicare HMO

## 2017-06-14 DIAGNOSIS — E1140 Type 2 diabetes mellitus with diabetic neuropathy, unspecified: Secondary | ICD-10-CM

## 2017-06-14 DIAGNOSIS — B351 Tinea unguium: Secondary | ICD-10-CM

## 2017-06-14 DIAGNOSIS — I739 Peripheral vascular disease, unspecified: Secondary | ICD-10-CM

## 2017-06-14 DIAGNOSIS — M5136 Other intervertebral disc degeneration, lumbar region: Secondary | ICD-10-CM | POA: Diagnosis not present

## 2017-06-14 DIAGNOSIS — J449 Chronic obstructive pulmonary disease, unspecified: Secondary | ICD-10-CM | POA: Diagnosis not present

## 2017-06-14 DIAGNOSIS — L97511 Non-pressure chronic ulcer of other part of right foot limited to breakdown of skin: Secondary | ICD-10-CM

## 2017-06-14 DIAGNOSIS — G894 Chronic pain syndrome: Secondary | ICD-10-CM | POA: Diagnosis not present

## 2017-06-14 DIAGNOSIS — M79609 Pain in unspecified limb: Secondary | ICD-10-CM

## 2017-06-14 DIAGNOSIS — R0989 Other specified symptoms and signs involving the circulatory and respiratory systems: Secondary | ICD-10-CM

## 2017-06-14 DIAGNOSIS — S7000XA Contusion of unspecified hip, initial encounter: Secondary | ICD-10-CM | POA: Diagnosis not present

## 2017-06-14 DIAGNOSIS — M4696 Unspecified inflammatory spondylopathy, lumbar region: Secondary | ICD-10-CM | POA: Diagnosis not present

## 2017-06-14 DIAGNOSIS — Z794 Long term (current) use of insulin: Secondary | ICD-10-CM

## 2017-06-14 DIAGNOSIS — I878 Other specified disorders of veins: Secondary | ICD-10-CM

## 2017-06-14 NOTE — Progress Notes (Signed)
This encounter was created in error - please disregard.

## 2017-06-14 NOTE — Addendum Note (Signed)
Addended by: Jiles Harold on: 06/14/2017 08:55 AM   Modules accepted: Level of Service, SmartSet

## 2017-06-15 ENCOUNTER — Telehealth: Payer: Self-pay | Admitting: *Deleted

## 2017-06-15 DIAGNOSIS — F3341 Major depressive disorder, recurrent, in partial remission: Secondary | ICD-10-CM | POA: Diagnosis not present

## 2017-06-15 DIAGNOSIS — E1121 Type 2 diabetes mellitus with diabetic nephropathy: Secondary | ICD-10-CM | POA: Diagnosis not present

## 2017-06-15 DIAGNOSIS — I5022 Chronic systolic (congestive) heart failure: Secondary | ICD-10-CM | POA: Diagnosis not present

## 2017-06-15 DIAGNOSIS — E1142 Type 2 diabetes mellitus with diabetic polyneuropathy: Secondary | ICD-10-CM | POA: Diagnosis not present

## 2017-06-15 DIAGNOSIS — L97515 Non-pressure chronic ulcer of other part of right foot with muscle involvement without evidence of necrosis: Secondary | ICD-10-CM | POA: Diagnosis not present

## 2017-06-15 DIAGNOSIS — G4733 Obstructive sleep apnea (adult) (pediatric): Secondary | ICD-10-CM | POA: Diagnosis not present

## 2017-06-15 DIAGNOSIS — I480 Paroxysmal atrial fibrillation: Secondary | ICD-10-CM | POA: Diagnosis not present

## 2017-06-15 DIAGNOSIS — E11621 Type 2 diabetes mellitus with foot ulcer: Secondary | ICD-10-CM | POA: Diagnosis not present

## 2017-06-15 DIAGNOSIS — G6181 Chronic inflammatory demyelinating polyneuritis: Secondary | ICD-10-CM | POA: Diagnosis not present

## 2017-06-15 DIAGNOSIS — R0989 Other specified symptoms and signs involving the circulatory and respiratory systems: Secondary | ICD-10-CM

## 2017-06-15 NOTE — Progress Notes (Signed)
Subjective: Johnathan Wright is a 71 y.o. male patient with history of diabetes who presents to office today complaining of long, painful nails  while ambulating in shoes; unable to trim. Patient states that the glucose reading this morning was not recorded, but on average runs between 85-121. Patient denies any new changes in medication or new problems. Patient admits to constant, numbness, burning or tingling in the legs. Patient has home health wrapping his ulceration as prescribed by his primary care doctor at the right great toe and has had ulcer for over 3 years with no healing or improvement. Patient desires to discuss possible options for this chronic toe ulcer.   Patient Active Problem List   Diagnosis Date Noted  . 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 Abnormality of gait 09/13/2015  . 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 allopurinol (ZYLOPRIM) 100 MG tablet Take 100 mg by mouth daily.    Marland Kitchen aspirin 81 MG tablet Take 81 mg by mouth daily.    Marland Kitchen atorvastatin (LIPITOR) 10 MG tablet Take 10 mg by mouth daily.    . Bilberry 1000 MG CAPS Take by mouth.    Marland Kitchen CINNAMON PO Take by mouth daily.    . clotrimazole (LOTRIMIN) 1 % cream Apply 1 application topically 2 (two) times daily.    . cyanocobalamin (,VITAMIN B-12,) 1000 MCG/ML injection Inject 1 mL (1,000 mcg total) into the muscle once. Vitamin B12 1 ml IM daily x 7 days then 1 ml IM weekly x 4 weeks then 1 ml IM monthly x 1 year. 1 mL 0  . desvenlafaxine (PRISTIQ) 100 MG 24 hr tablet Take 100 mg by mouth daily.    . furosemide (LASIX) 40 MG tablet Take 40 mg by mouth.    . Insulin Glargine (TOUJEO SOLOSTAR) 300 UNIT/ML SOPN Inject into the skin.    Marland Kitchen L-Arginine 500 MG CAPS Take by mouth.    . Liraglutide (VICTOZA) 18 MG/3ML SOPN Inject into the skin.    . Lutein 40 MG CAPS Take by mouth.    . Lutein 6 MG TABS  Take by mouth.    . LYSINE PO Take 1,000 mg by mouth daily.    . metFORMIN (GLUCOPHAGE) 1000 MG tablet Take 1,000 mg by mouth 2 (two) times daily with a meal.    . oxycodone (OXY-IR) 5 MG capsule Take 5 mg by mouth every 4 (four) hours as needed.     No current facility-administered medications on file prior to visit.    No Known Allergies  No results found for this or any previous visit (from the past 2160 hour(s)).  Objective: General: Patient is awake, alert, and oriented x 3 and in no acute distress.  Integument: Skin is cool, dry,  and supple bilateral. Nails are tender, long, thickened and  dystrophic with subungual debris, consistent with onychomycosis, 1-5 bilateral. No signs of infe, There is a partial thickness ulceration present at right plantar hallux that measures 1.5 x 1 cm with a granular base with mild periwound maceration. There is mild yellow drainage from the ulceration. However, without odor without erythema or other acute signs of infection. There is pre-ulcerative lesion at the distal tuft of the left second toe with no break in skin or acute signs of infection.   Vasculature:  Dorsalis Pedis pulse 0/4 bilateral. Posterior Tibial pulse  0/4 bilateral.  Capillary fill time <5 sec  1-5 bilateral. No growth to the level of the digits. Temperature gradientdecreased bilateral.  Trophic skin changes and venous stasis with discoloration and 1+ pitting edema bilateral.   Neurology: The patient absent sensation measured with a 5.07/10g Semmes Weinstein Monofilament at all pedal sites bilateral . Vibratory sensationabsent bilateral with tuning fork. No Babinski sign present bilateral.   Musculoskeletal: Hammertoe and pes planus deformities noted bilateral. Muscular strength 5/5 in all lower extremity muscular groups bilateral without pain on range of motion . No tenderness with calf compression bilateral.  X-rays right foot. Normal osseous mineralization. There is significant  arthritis with hallux IP J contracture. There is no obvious bony destruction or signs of osteomyelitis at the area of ulceration plantar hallux. There is no air or gas in the soft tissues. There is midtarsal breach, suggestive of pes planus with diffuse arthritis along the mid foot and ankle and calcaneal spurring. No fracture or dislocation. No other acute findings.   Assessment and Plan: Problem List Items Addressed This Visit    None    Visit Diagnoses    Chronic toe ulcer, right, limited to breakdown of skin ([place])    -  Primary   Relevant Orders   DG Foot Complete Right   WOUND CULTURE   Diminished pulses in lower extremity       Pain due to onychomycosis of nail       Type 2 diabetes mellitus with diabetic neuropathy, with long-term current use of insulin (HCC)       Venous stasis       PVD (peripheral vascular disease) ([place])         -Examined patient. -Discussed and educated patient on diabetic foot care, especially with  regards to the vascular, neurological and musculoskeletal systems.  -Stressed the importance of good glycemic control and the detriment of not  controlling glucose levels in relation to the foot. -Mechanically debrided all nails 1-5 bilateral using sterile nail nipper and filed with dremel without incident  -X-rays were taken and reviewed of the right foot. No obvious osteomyelitis. However, due to chronicity of ulceration. It is highly likely. Thus, I advised patient that is otherwise at this time. 2. Culture wound to see if there is any superficial biofilm or bacterial growing that may need to be treated as well as vascular studies to assess circulation for possible future planning of amputation of right great toe  -Cleansed ulceration at right great toe wound culture was obtained and sent to [facility] applied Prisma and gauze dressing. -Patient to continue with home healthcare up using silver alginate dressing to right great toe ulceration  -Continue with wound  healing shoe on right -Advised patient We will closely monitor left second toe at area of pre-ulcerative lesion. However, at this time. There is no acute concerns, however Advised patient to make sure that he has ain his shoes to prevent irritation or rubbing to left second toe, which can cause ulceration. -Answered all patient questions -Patient to returnafter vascular studies to further discuss possible amputation of right great toe pending results -Patient advised to call the office if any problems or questions arise in the meantime.  Landis Martins, DPM

## 2017-06-15 NOTE — Telephone Encounter (Addendum)
-----   Message from Landis Martins, [place] sent at 06/14/2017  3:09 PM EDT ----- Regarding: ABIs Chronic right 1st toe ulcer. Need ABIs. Diminished pedal pulse. 06/15/2017-Faxed orders to [facility].

## 2017-06-18 ENCOUNTER — Other Ambulatory Visit: Payer: Self-pay

## 2017-06-18 DIAGNOSIS — I5022 Chronic systolic (congestive) heart failure: Secondary | ICD-10-CM | POA: Diagnosis not present

## 2017-06-18 DIAGNOSIS — F3341 Major depressive disorder, recurrent, in partial remission: Secondary | ICD-10-CM | POA: Diagnosis not present

## 2017-06-18 DIAGNOSIS — G6181 Chronic inflammatory demyelinating polyneuritis: Secondary | ICD-10-CM | POA: Diagnosis not present

## 2017-06-18 DIAGNOSIS — E11621 Type 2 diabetes mellitus with foot ulcer: Secondary | ICD-10-CM | POA: Diagnosis not present

## 2017-06-18 DIAGNOSIS — G4733 Obstructive sleep apnea (adult) (pediatric): Secondary | ICD-10-CM | POA: Diagnosis not present

## 2017-06-18 DIAGNOSIS — E1142 Type 2 diabetes mellitus with diabetic polyneuropathy: Secondary | ICD-10-CM | POA: Diagnosis not present

## 2017-06-18 DIAGNOSIS — E1121 Type 2 diabetes mellitus with diabetic nephropathy: Secondary | ICD-10-CM | POA: Diagnosis not present

## 2017-06-18 DIAGNOSIS — L97515 Non-pressure chronic ulcer of other part of right foot with muscle involvement without evidence of necrosis: Secondary | ICD-10-CM | POA: Diagnosis not present

## 2017-06-18 DIAGNOSIS — I480 Paroxysmal atrial fibrillation: Secondary | ICD-10-CM | POA: Diagnosis not present

## 2017-06-18 NOTE — Patient Outreach (Signed)
[facility]) Care Management  06/18/2017  EURAL HOLZSCHUH 10/13/1946 [id number]   TELEPHONE SCREENING Referral date: 06/13/17 Referral source: patient engagement Referral reason: engagement tool score: 9 Insurance: [employer] Patient gave verbal authorization to speak with his friend/ caregiver, Earney Mallet regarding all of his personal health information.   SUBJECTIVE; Telephone call to patient regarding patient engagement referral. HIPAA verified with patient.  Spoke with Earney Mallet, caregiver.  Discussed and offered [facility] care management services.  Caregiver and patient refused services.  Caregiver states patient is not taking all of his medications as prescribed. States patients doctor is aware of patient no longer taking his Eliquis. Caregiver states patient in not taking his metformin. Caregiver states patients doctor is not aware of this. Caregiver states patients blood sugar this am was 81 fasting.  Patient expresses symptoms of anxiety / depression. Patient states he is on medication. Refuses any additional treatment for anxiety/ depression.   Patient agrees to received [facility] care management brochure for future reference.   PLAN: RNCM will refer patient to care management assistant to close due to refusal of services.  RNCM will notify patients primary MD of closure.  RNCM will inform patients primary MD in writing patient not taking metformin and patients PHQ 2 score 5, PHQ 9 score 16. RNCM will send patient Advance directive packet and [facility] care management brochure as requested.   Quinn Plowman RN,BSN,CCM [facility] Telephonic  [phone]

## 2017-06-20 ENCOUNTER — Other Ambulatory Visit: Payer: Self-pay

## 2017-06-20 DIAGNOSIS — F3341 Major depressive disorder, recurrent, in partial remission: Secondary | ICD-10-CM | POA: Diagnosis not present

## 2017-06-20 DIAGNOSIS — G6181 Chronic inflammatory demyelinating polyneuritis: Secondary | ICD-10-CM | POA: Diagnosis not present

## 2017-06-20 DIAGNOSIS — E11621 Type 2 diabetes mellitus with foot ulcer: Secondary | ICD-10-CM | POA: Diagnosis not present

## 2017-06-20 DIAGNOSIS — E1142 Type 2 diabetes mellitus with diabetic polyneuropathy: Secondary | ICD-10-CM | POA: Diagnosis not present

## 2017-06-20 DIAGNOSIS — I480 Paroxysmal atrial fibrillation: Secondary | ICD-10-CM | POA: Diagnosis not present

## 2017-06-20 DIAGNOSIS — I5022 Chronic systolic (congestive) heart failure: Secondary | ICD-10-CM | POA: Diagnosis not present

## 2017-06-20 DIAGNOSIS — E1121 Type 2 diabetes mellitus with diabetic nephropathy: Secondary | ICD-10-CM | POA: Diagnosis not present

## 2017-06-20 DIAGNOSIS — G4733 Obstructive sleep apnea (adult) (pediatric): Secondary | ICD-10-CM | POA: Diagnosis not present

## 2017-06-20 DIAGNOSIS — L97515 Non-pressure chronic ulcer of other part of right foot with muscle involvement without evidence of necrosis: Secondary | ICD-10-CM | POA: Diagnosis not present

## 2017-06-22 DIAGNOSIS — E1121 Type 2 diabetes mellitus with diabetic nephropathy: Secondary | ICD-10-CM | POA: Diagnosis not present

## 2017-06-22 DIAGNOSIS — G4733 Obstructive sleep apnea (adult) (pediatric): Secondary | ICD-10-CM | POA: Diagnosis not present

## 2017-06-22 DIAGNOSIS — I5022 Chronic systolic (congestive) heart failure: Secondary | ICD-10-CM | POA: Diagnosis not present

## 2017-06-22 DIAGNOSIS — G6181 Chronic inflammatory demyelinating polyneuritis: Secondary | ICD-10-CM | POA: Diagnosis not present

## 2017-06-22 DIAGNOSIS — E11621 Type 2 diabetes mellitus with foot ulcer: Secondary | ICD-10-CM | POA: Diagnosis not present

## 2017-06-22 DIAGNOSIS — I480 Paroxysmal atrial fibrillation: Secondary | ICD-10-CM | POA: Diagnosis not present

## 2017-06-22 DIAGNOSIS — L97515 Non-pressure chronic ulcer of other part of right foot with muscle involvement without evidence of necrosis: Secondary | ICD-10-CM | POA: Diagnosis not present

## 2017-06-22 DIAGNOSIS — F3341 Major depressive disorder, recurrent, in partial remission: Secondary | ICD-10-CM | POA: Diagnosis not present

## 2017-06-22 DIAGNOSIS — E1142 Type 2 diabetes mellitus with diabetic polyneuropathy: Secondary | ICD-10-CM | POA: Diagnosis not present

## 2017-06-26 DIAGNOSIS — L97515 Non-pressure chronic ulcer of other part of right foot with muscle involvement without evidence of necrosis: Secondary | ICD-10-CM | POA: Diagnosis not present

## 2017-06-26 DIAGNOSIS — E11621 Type 2 diabetes mellitus with foot ulcer: Secondary | ICD-10-CM | POA: Diagnosis not present

## 2017-06-26 DIAGNOSIS — G6181 Chronic inflammatory demyelinating polyneuritis: Secondary | ICD-10-CM | POA: Diagnosis not present

## 2017-06-26 DIAGNOSIS — G4733 Obstructive sleep apnea (adult) (pediatric): Secondary | ICD-10-CM | POA: Diagnosis not present

## 2017-06-26 DIAGNOSIS — E1142 Type 2 diabetes mellitus with diabetic polyneuropathy: Secondary | ICD-10-CM | POA: Diagnosis not present

## 2017-06-26 DIAGNOSIS — I480 Paroxysmal atrial fibrillation: Secondary | ICD-10-CM | POA: Diagnosis not present

## 2017-06-26 DIAGNOSIS — E1121 Type 2 diabetes mellitus with diabetic nephropathy: Secondary | ICD-10-CM | POA: Diagnosis not present

## 2017-06-26 DIAGNOSIS — F3341 Major depressive disorder, recurrent, in partial remission: Secondary | ICD-10-CM | POA: Diagnosis not present

## 2017-06-26 DIAGNOSIS — I5022 Chronic systolic (congestive) heart failure: Secondary | ICD-10-CM | POA: Diagnosis not present

## 2017-06-28 ENCOUNTER — Telehealth: Payer: Self-pay | Admitting: *Deleted

## 2017-06-28 MED ORDER — CLINDAMYCIN HCL 300 MG PO CAPS: 300.0000 mg | ORAL_CAPSULE | Freq: Three times a day (TID) | ORAL | 0 refills | Status: DC

## 2017-06-28 MED ORDER — AMOXICILLIN-POT CLAVULANATE 875-125 MG PO TABS: 1.0000 | ORAL_TABLET | Freq: Two times a day (BID) | ORAL | 0 refills | Status: DC

## 2017-06-28 NOTE — Telephone Encounter (Signed)
Called patient and informed him of the culture results and informed of antibiotics being called in.  Confirmed pharmacy patient stated understanding to pick up meds.

## 2017-06-28 NOTE — Telephone Encounter (Signed)
-----   Message from Landis Martins, [place] sent at 06/27/2017  1:42 PM EDT ----- Regarding: Culture results Cogen wound culture results were delayed due to processing and the holiday Results + for staph and strept with enterococcus  Please send to pharmacy Clindamycin 300mg  TID x 10 days (30 tabs) and Augmentin 875mg  bid x 10 days  (20 tabs)  Thanks Dr. Chauncey Cruel

## 2017-06-29 DIAGNOSIS — I5022 Chronic systolic (congestive) heart failure: Secondary | ICD-10-CM | POA: Diagnosis not present

## 2017-06-29 DIAGNOSIS — F3341 Major depressive disorder, recurrent, in partial remission: Secondary | ICD-10-CM | POA: Diagnosis not present

## 2017-06-29 DIAGNOSIS — G6181 Chronic inflammatory demyelinating polyneuritis: Secondary | ICD-10-CM | POA: Diagnosis not present

## 2017-06-29 DIAGNOSIS — L97515 Non-pressure chronic ulcer of other part of right foot with muscle involvement without evidence of necrosis: Secondary | ICD-10-CM | POA: Diagnosis not present

## 2017-06-29 DIAGNOSIS — E1142 Type 2 diabetes mellitus with diabetic polyneuropathy: Secondary | ICD-10-CM | POA: Diagnosis not present

## 2017-06-29 DIAGNOSIS — E1121 Type 2 diabetes mellitus with diabetic nephropathy: Secondary | ICD-10-CM | POA: Diagnosis not present

## 2017-06-29 DIAGNOSIS — I480 Paroxysmal atrial fibrillation: Secondary | ICD-10-CM | POA: Diagnosis not present

## 2017-06-29 DIAGNOSIS — G4733 Obstructive sleep apnea (adult) (pediatric): Secondary | ICD-10-CM | POA: Diagnosis not present

## 2017-06-29 DIAGNOSIS — E11621 Type 2 diabetes mellitus with foot ulcer: Secondary | ICD-10-CM | POA: Diagnosis not present

## 2017-07-02 DIAGNOSIS — E1142 Type 2 diabetes mellitus with diabetic polyneuropathy: Secondary | ICD-10-CM | POA: Diagnosis not present

## 2017-07-02 DIAGNOSIS — I5022 Chronic systolic (congestive) heart failure: Secondary | ICD-10-CM | POA: Diagnosis not present

## 2017-07-02 DIAGNOSIS — E11621 Type 2 diabetes mellitus with foot ulcer: Secondary | ICD-10-CM | POA: Diagnosis not present

## 2017-07-02 DIAGNOSIS — E1121 Type 2 diabetes mellitus with diabetic nephropathy: Secondary | ICD-10-CM | POA: Diagnosis not present

## 2017-07-02 DIAGNOSIS — F3341 Major depressive disorder, recurrent, in partial remission: Secondary | ICD-10-CM | POA: Diagnosis not present

## 2017-07-02 DIAGNOSIS — I480 Paroxysmal atrial fibrillation: Secondary | ICD-10-CM | POA: Diagnosis not present

## 2017-07-02 DIAGNOSIS — G4733 Obstructive sleep apnea (adult) (pediatric): Secondary | ICD-10-CM | POA: Diagnosis not present

## 2017-07-02 DIAGNOSIS — L97515 Non-pressure chronic ulcer of other part of right foot with muscle involvement without evidence of necrosis: Secondary | ICD-10-CM | POA: Diagnosis not present

## 2017-07-02 DIAGNOSIS — G6181 Chronic inflammatory demyelinating polyneuritis: Secondary | ICD-10-CM | POA: Diagnosis not present

## 2017-07-04 DIAGNOSIS — G6181 Chronic inflammatory demyelinating polyneuritis: Secondary | ICD-10-CM | POA: Diagnosis not present

## 2017-07-04 DIAGNOSIS — E1121 Type 2 diabetes mellitus with diabetic nephropathy: Secondary | ICD-10-CM | POA: Diagnosis not present

## 2017-07-04 DIAGNOSIS — F3341 Major depressive disorder, recurrent, in partial remission: Secondary | ICD-10-CM | POA: Diagnosis not present

## 2017-07-04 DIAGNOSIS — E1142 Type 2 diabetes mellitus with diabetic polyneuropathy: Secondary | ICD-10-CM | POA: Diagnosis not present

## 2017-07-04 DIAGNOSIS — E11621 Type 2 diabetes mellitus with foot ulcer: Secondary | ICD-10-CM | POA: Diagnosis not present

## 2017-07-04 DIAGNOSIS — I5022 Chronic systolic (congestive) heart failure: Secondary | ICD-10-CM | POA: Diagnosis not present

## 2017-07-04 DIAGNOSIS — L97515 Non-pressure chronic ulcer of other part of right foot with muscle involvement without evidence of necrosis: Secondary | ICD-10-CM | POA: Diagnosis not present

## 2017-07-04 DIAGNOSIS — G4733 Obstructive sleep apnea (adult) (pediatric): Secondary | ICD-10-CM | POA: Diagnosis not present

## 2017-07-04 DIAGNOSIS — I480 Paroxysmal atrial fibrillation: Secondary | ICD-10-CM | POA: Diagnosis not present

## 2017-07-06 DIAGNOSIS — G4733 Obstructive sleep apnea (adult) (pediatric): Secondary | ICD-10-CM | POA: Diagnosis not present

## 2017-07-06 DIAGNOSIS — G6181 Chronic inflammatory demyelinating polyneuritis: Secondary | ICD-10-CM | POA: Diagnosis not present

## 2017-07-06 DIAGNOSIS — E11621 Type 2 diabetes mellitus with foot ulcer: Secondary | ICD-10-CM | POA: Diagnosis not present

## 2017-07-06 DIAGNOSIS — I480 Paroxysmal atrial fibrillation: Secondary | ICD-10-CM | POA: Diagnosis not present

## 2017-07-06 DIAGNOSIS — E1121 Type 2 diabetes mellitus with diabetic nephropathy: Secondary | ICD-10-CM | POA: Diagnosis not present

## 2017-07-06 DIAGNOSIS — F3341 Major depressive disorder, recurrent, in partial remission: Secondary | ICD-10-CM | POA: Diagnosis not present

## 2017-07-06 DIAGNOSIS — L97515 Non-pressure chronic ulcer of other part of right foot with muscle involvement without evidence of necrosis: Secondary | ICD-10-CM | POA: Diagnosis not present

## 2017-07-06 DIAGNOSIS — E1142 Type 2 diabetes mellitus with diabetic polyneuropathy: Secondary | ICD-10-CM | POA: Diagnosis not present

## 2017-07-06 DIAGNOSIS — I5022 Chronic systolic (congestive) heart failure: Secondary | ICD-10-CM | POA: Diagnosis not present

## 2017-07-09 DIAGNOSIS — G4733 Obstructive sleep apnea (adult) (pediatric): Secondary | ICD-10-CM | POA: Diagnosis not present

## 2017-07-09 DIAGNOSIS — L97515 Non-pressure chronic ulcer of other part of right foot with muscle involvement without evidence of necrosis: Secondary | ICD-10-CM | POA: Diagnosis not present

## 2017-07-09 DIAGNOSIS — E11621 Type 2 diabetes mellitus with foot ulcer: Secondary | ICD-10-CM | POA: Diagnosis not present

## 2017-07-09 DIAGNOSIS — I5022 Chronic systolic (congestive) heart failure: Secondary | ICD-10-CM | POA: Diagnosis not present

## 2017-07-09 DIAGNOSIS — F3341 Major depressive disorder, recurrent, in partial remission: Secondary | ICD-10-CM | POA: Diagnosis not present

## 2017-07-09 DIAGNOSIS — E1121 Type 2 diabetes mellitus with diabetic nephropathy: Secondary | ICD-10-CM | POA: Diagnosis not present

## 2017-07-09 DIAGNOSIS — I480 Paroxysmal atrial fibrillation: Secondary | ICD-10-CM | POA: Diagnosis not present

## 2017-07-09 DIAGNOSIS — E1142 Type 2 diabetes mellitus with diabetic polyneuropathy: Secondary | ICD-10-CM | POA: Diagnosis not present

## 2017-07-09 DIAGNOSIS — G6181 Chronic inflammatory demyelinating polyneuritis: Secondary | ICD-10-CM | POA: Diagnosis not present

## 2017-07-11 DIAGNOSIS — F3341 Major depressive disorder, recurrent, in partial remission: Secondary | ICD-10-CM | POA: Diagnosis not present

## 2017-07-11 DIAGNOSIS — L97515 Non-pressure chronic ulcer of other part of right foot with muscle involvement without evidence of necrosis: Secondary | ICD-10-CM | POA: Diagnosis not present

## 2017-07-11 DIAGNOSIS — E1142 Type 2 diabetes mellitus with diabetic polyneuropathy: Secondary | ICD-10-CM | POA: Diagnosis not present

## 2017-07-11 DIAGNOSIS — E1121 Type 2 diabetes mellitus with diabetic nephropathy: Secondary | ICD-10-CM | POA: Diagnosis not present

## 2017-07-11 DIAGNOSIS — I5022 Chronic systolic (congestive) heart failure: Secondary | ICD-10-CM | POA: Diagnosis not present

## 2017-07-11 DIAGNOSIS — G4733 Obstructive sleep apnea (adult) (pediatric): Secondary | ICD-10-CM | POA: Diagnosis not present

## 2017-07-11 DIAGNOSIS — G6181 Chronic inflammatory demyelinating polyneuritis: Secondary | ICD-10-CM | POA: Diagnosis not present

## 2017-07-11 DIAGNOSIS — E11621 Type 2 diabetes mellitus with foot ulcer: Secondary | ICD-10-CM | POA: Diagnosis not present

## 2017-07-11 DIAGNOSIS — I480 Paroxysmal atrial fibrillation: Secondary | ICD-10-CM | POA: Diagnosis not present

## 2017-07-13 DIAGNOSIS — F3341 Major depressive disorder, recurrent, in partial remission: Secondary | ICD-10-CM | POA: Diagnosis not present

## 2017-07-13 DIAGNOSIS — I5022 Chronic systolic (congestive) heart failure: Secondary | ICD-10-CM | POA: Diagnosis not present

## 2017-07-13 DIAGNOSIS — G4733 Obstructive sleep apnea (adult) (pediatric): Secondary | ICD-10-CM | POA: Diagnosis not present

## 2017-07-13 DIAGNOSIS — E1121 Type 2 diabetes mellitus with diabetic nephropathy: Secondary | ICD-10-CM | POA: Diagnosis not present

## 2017-07-13 DIAGNOSIS — E1142 Type 2 diabetes mellitus with diabetic polyneuropathy: Secondary | ICD-10-CM | POA: Diagnosis not present

## 2017-07-13 DIAGNOSIS — L97515 Non-pressure chronic ulcer of other part of right foot with muscle involvement without evidence of necrosis: Secondary | ICD-10-CM | POA: Diagnosis not present

## 2017-07-13 DIAGNOSIS — E11621 Type 2 diabetes mellitus with foot ulcer: Secondary | ICD-10-CM | POA: Diagnosis not present

## 2017-07-13 DIAGNOSIS — G6181 Chronic inflammatory demyelinating polyneuritis: Secondary | ICD-10-CM | POA: Diagnosis not present

## 2017-07-13 DIAGNOSIS — I480 Paroxysmal atrial fibrillation: Secondary | ICD-10-CM | POA: Diagnosis not present

## 2017-07-16 DIAGNOSIS — E1142 Type 2 diabetes mellitus with diabetic polyneuropathy: Secondary | ICD-10-CM | POA: Diagnosis not present

## 2017-07-16 DIAGNOSIS — G6181 Chronic inflammatory demyelinating polyneuritis: Secondary | ICD-10-CM | POA: Diagnosis not present

## 2017-07-16 DIAGNOSIS — E1121 Type 2 diabetes mellitus with diabetic nephropathy: Secondary | ICD-10-CM | POA: Diagnosis not present

## 2017-07-16 DIAGNOSIS — I5022 Chronic systolic (congestive) heart failure: Secondary | ICD-10-CM | POA: Diagnosis not present

## 2017-07-16 DIAGNOSIS — E11621 Type 2 diabetes mellitus with foot ulcer: Secondary | ICD-10-CM | POA: Diagnosis not present

## 2017-07-16 DIAGNOSIS — G4733 Obstructive sleep apnea (adult) (pediatric): Secondary | ICD-10-CM | POA: Diagnosis not present

## 2017-07-16 DIAGNOSIS — F3341 Major depressive disorder, recurrent, in partial remission: Secondary | ICD-10-CM | POA: Diagnosis not present

## 2017-07-16 DIAGNOSIS — I480 Paroxysmal atrial fibrillation: Secondary | ICD-10-CM | POA: Diagnosis not present

## 2017-07-16 DIAGNOSIS — L97515 Non-pressure chronic ulcer of other part of right foot with muscle involvement without evidence of necrosis: Secondary | ICD-10-CM | POA: Diagnosis not present

## 2017-07-18 DIAGNOSIS — E1121 Type 2 diabetes mellitus with diabetic nephropathy: Secondary | ICD-10-CM | POA: Diagnosis not present

## 2017-07-18 DIAGNOSIS — I5022 Chronic systolic (congestive) heart failure: Secondary | ICD-10-CM | POA: Diagnosis not present

## 2017-07-18 DIAGNOSIS — E1142 Type 2 diabetes mellitus with diabetic polyneuropathy: Secondary | ICD-10-CM | POA: Diagnosis not present

## 2017-07-18 DIAGNOSIS — E11621 Type 2 diabetes mellitus with foot ulcer: Secondary | ICD-10-CM | POA: Diagnosis not present

## 2017-07-18 DIAGNOSIS — I480 Paroxysmal atrial fibrillation: Secondary | ICD-10-CM | POA: Diagnosis not present

## 2017-07-18 DIAGNOSIS — G4733 Obstructive sleep apnea (adult) (pediatric): Secondary | ICD-10-CM | POA: Diagnosis not present

## 2017-07-18 DIAGNOSIS — L97515 Non-pressure chronic ulcer of other part of right foot with muscle involvement without evidence of necrosis: Secondary | ICD-10-CM | POA: Diagnosis not present

## 2017-07-18 DIAGNOSIS — G6181 Chronic inflammatory demyelinating polyneuritis: Secondary | ICD-10-CM | POA: Diagnosis not present

## 2017-07-18 DIAGNOSIS — F3341 Major depressive disorder, recurrent, in partial remission: Secondary | ICD-10-CM | POA: Diagnosis not present

## 2017-07-20 DIAGNOSIS — E1142 Type 2 diabetes mellitus with diabetic polyneuropathy: Secondary | ICD-10-CM | POA: Diagnosis not present

## 2017-07-20 DIAGNOSIS — I480 Paroxysmal atrial fibrillation: Secondary | ICD-10-CM | POA: Diagnosis not present

## 2017-07-20 DIAGNOSIS — F3341 Major depressive disorder, recurrent, in partial remission: Secondary | ICD-10-CM | POA: Diagnosis not present

## 2017-07-20 DIAGNOSIS — G6181 Chronic inflammatory demyelinating polyneuritis: Secondary | ICD-10-CM | POA: Diagnosis not present

## 2017-07-20 DIAGNOSIS — L97515 Non-pressure chronic ulcer of other part of right foot with muscle involvement without evidence of necrosis: Secondary | ICD-10-CM | POA: Diagnosis not present

## 2017-07-20 DIAGNOSIS — E11621 Type 2 diabetes mellitus with foot ulcer: Secondary | ICD-10-CM | POA: Diagnosis not present

## 2017-07-20 DIAGNOSIS — G4733 Obstructive sleep apnea (adult) (pediatric): Secondary | ICD-10-CM | POA: Diagnosis not present

## 2017-07-20 DIAGNOSIS — E1121 Type 2 diabetes mellitus with diabetic nephropathy: Secondary | ICD-10-CM | POA: Diagnosis not present

## 2017-07-20 DIAGNOSIS — I5022 Chronic systolic (congestive) heart failure: Secondary | ICD-10-CM | POA: Diagnosis not present

## 2017-07-23 ENCOUNTER — Ambulatory Visit (INDEPENDENT_AMBULATORY_CARE_PROVIDER_SITE_OTHER): Payer: Medicare HMO | Admitting: Podiatry

## 2017-07-23 ENCOUNTER — Encounter (INDEPENDENT_AMBULATORY_CARE_PROVIDER_SITE_OTHER): Payer: Self-pay

## 2017-07-23 DIAGNOSIS — E1140 Type 2 diabetes mellitus with diabetic neuropathy, unspecified: Secondary | ICD-10-CM

## 2017-07-23 DIAGNOSIS — R0989 Other specified symptoms and signs involving the circulatory and respiratory systems: Secondary | ICD-10-CM

## 2017-07-23 DIAGNOSIS — L97511 Non-pressure chronic ulcer of other part of right foot limited to breakdown of skin: Secondary | ICD-10-CM

## 2017-07-23 DIAGNOSIS — R69 Illness, unspecified: Secondary | ICD-10-CM | POA: Diagnosis not present

## 2017-07-23 DIAGNOSIS — Z794 Long term (current) use of insulin: Secondary | ICD-10-CM

## 2017-07-24 ENCOUNTER — Ambulatory Visit: Payer: Medicare HMO | Admitting: Podiatry

## 2017-07-25 DIAGNOSIS — I480 Paroxysmal atrial fibrillation: Secondary | ICD-10-CM | POA: Diagnosis not present

## 2017-07-25 DIAGNOSIS — G4733 Obstructive sleep apnea (adult) (pediatric): Secondary | ICD-10-CM | POA: Diagnosis not present

## 2017-07-25 DIAGNOSIS — G6181 Chronic inflammatory demyelinating polyneuritis: Secondary | ICD-10-CM | POA: Diagnosis not present

## 2017-07-25 DIAGNOSIS — E1121 Type 2 diabetes mellitus with diabetic nephropathy: Secondary | ICD-10-CM | POA: Diagnosis not present

## 2017-07-25 DIAGNOSIS — F3341 Major depressive disorder, recurrent, in partial remission: Secondary | ICD-10-CM | POA: Diagnosis not present

## 2017-07-25 DIAGNOSIS — E11621 Type 2 diabetes mellitus with foot ulcer: Secondary | ICD-10-CM | POA: Diagnosis not present

## 2017-07-25 DIAGNOSIS — L97515 Non-pressure chronic ulcer of other part of right foot with muscle involvement without evidence of necrosis: Secondary | ICD-10-CM | POA: Diagnosis not present

## 2017-07-25 DIAGNOSIS — E1142 Type 2 diabetes mellitus with diabetic polyneuropathy: Secondary | ICD-10-CM | POA: Diagnosis not present

## 2017-07-25 DIAGNOSIS — I5022 Chronic systolic (congestive) heart failure: Secondary | ICD-10-CM | POA: Diagnosis not present

## 2017-07-26 DIAGNOSIS — I5022 Chronic systolic (congestive) heart failure: Secondary | ICD-10-CM | POA: Diagnosis not present

## 2017-07-26 DIAGNOSIS — E1142 Type 2 diabetes mellitus with diabetic polyneuropathy: Secondary | ICD-10-CM | POA: Diagnosis not present

## 2017-07-26 DIAGNOSIS — E1121 Type 2 diabetes mellitus with diabetic nephropathy: Secondary | ICD-10-CM | POA: Diagnosis not present

## 2017-07-26 DIAGNOSIS — I480 Paroxysmal atrial fibrillation: Secondary | ICD-10-CM | POA: Diagnosis not present

## 2017-07-26 DIAGNOSIS — F3341 Major depressive disorder, recurrent, in partial remission: Secondary | ICD-10-CM | POA: Diagnosis not present

## 2017-07-26 DIAGNOSIS — L97515 Non-pressure chronic ulcer of other part of right foot with muscle involvement without evidence of necrosis: Secondary | ICD-10-CM | POA: Diagnosis not present

## 2017-07-26 DIAGNOSIS — G4733 Obstructive sleep apnea (adult) (pediatric): Secondary | ICD-10-CM | POA: Diagnosis not present

## 2017-07-26 DIAGNOSIS — E11621 Type 2 diabetes mellitus with foot ulcer: Secondary | ICD-10-CM | POA: Diagnosis not present

## 2017-07-26 DIAGNOSIS — G6181 Chronic inflammatory demyelinating polyneuritis: Secondary | ICD-10-CM | POA: Diagnosis not present

## 2017-07-27 DIAGNOSIS — E1142 Type 2 diabetes mellitus with diabetic polyneuropathy: Secondary | ICD-10-CM | POA: Diagnosis not present

## 2017-07-27 DIAGNOSIS — G6181 Chronic inflammatory demyelinating polyneuritis: Secondary | ICD-10-CM | POA: Diagnosis not present

## 2017-07-27 DIAGNOSIS — L97515 Non-pressure chronic ulcer of other part of right foot with muscle involvement without evidence of necrosis: Secondary | ICD-10-CM | POA: Diagnosis not present

## 2017-07-27 DIAGNOSIS — I5022 Chronic systolic (congestive) heart failure: Secondary | ICD-10-CM | POA: Diagnosis not present

## 2017-07-27 DIAGNOSIS — F3341 Major depressive disorder, recurrent, in partial remission: Secondary | ICD-10-CM | POA: Diagnosis not present

## 2017-07-27 DIAGNOSIS — G4733 Obstructive sleep apnea (adult) (pediatric): Secondary | ICD-10-CM | POA: Diagnosis not present

## 2017-07-27 DIAGNOSIS — I480 Paroxysmal atrial fibrillation: Secondary | ICD-10-CM | POA: Diagnosis not present

## 2017-07-27 DIAGNOSIS — E11621 Type 2 diabetes mellitus with foot ulcer: Secondary | ICD-10-CM | POA: Diagnosis not present

## 2017-07-27 DIAGNOSIS — E1121 Type 2 diabetes mellitus with diabetic nephropathy: Secondary | ICD-10-CM | POA: Diagnosis not present

## 2017-07-30 ENCOUNTER — Ambulatory Visit (INDEPENDENT_AMBULATORY_CARE_PROVIDER_SITE_OTHER): Payer: Medicare HMO | Admitting: Podiatry

## 2017-07-30 DIAGNOSIS — R69 Illness, unspecified: Secondary | ICD-10-CM | POA: Diagnosis not present

## 2017-07-30 DIAGNOSIS — L97511 Non-pressure chronic ulcer of other part of right foot limited to breakdown of skin: Secondary | ICD-10-CM | POA: Diagnosis not present

## 2017-08-01 DIAGNOSIS — I480 Paroxysmal atrial fibrillation: Secondary | ICD-10-CM | POA: Diagnosis not present

## 2017-08-01 DIAGNOSIS — E1121 Type 2 diabetes mellitus with diabetic nephropathy: Secondary | ICD-10-CM | POA: Diagnosis not present

## 2017-08-01 DIAGNOSIS — G6181 Chronic inflammatory demyelinating polyneuritis: Secondary | ICD-10-CM | POA: Diagnosis not present

## 2017-08-01 DIAGNOSIS — E11621 Type 2 diabetes mellitus with foot ulcer: Secondary | ICD-10-CM | POA: Diagnosis not present

## 2017-08-01 DIAGNOSIS — L97515 Non-pressure chronic ulcer of other part of right foot with muscle involvement without evidence of necrosis: Secondary | ICD-10-CM | POA: Diagnosis not present

## 2017-08-01 DIAGNOSIS — G4733 Obstructive sleep apnea (adult) (pediatric): Secondary | ICD-10-CM | POA: Diagnosis not present

## 2017-08-01 DIAGNOSIS — I5022 Chronic systolic (congestive) heart failure: Secondary | ICD-10-CM | POA: Diagnosis not present

## 2017-08-01 DIAGNOSIS — E1142 Type 2 diabetes mellitus with diabetic polyneuropathy: Secondary | ICD-10-CM | POA: Diagnosis not present

## 2017-08-01 DIAGNOSIS — F3341 Major depressive disorder, recurrent, in partial remission: Secondary | ICD-10-CM | POA: Diagnosis not present

## 2017-08-03 DIAGNOSIS — E11621 Type 2 diabetes mellitus with foot ulcer: Secondary | ICD-10-CM | POA: Diagnosis not present

## 2017-08-03 DIAGNOSIS — E1121 Type 2 diabetes mellitus with diabetic nephropathy: Secondary | ICD-10-CM | POA: Diagnosis not present

## 2017-08-03 DIAGNOSIS — I5022 Chronic systolic (congestive) heart failure: Secondary | ICD-10-CM | POA: Diagnosis not present

## 2017-08-03 DIAGNOSIS — G4733 Obstructive sleep apnea (adult) (pediatric): Secondary | ICD-10-CM | POA: Diagnosis not present

## 2017-08-03 DIAGNOSIS — L97515 Non-pressure chronic ulcer of other part of right foot with muscle involvement without evidence of necrosis: Secondary | ICD-10-CM | POA: Diagnosis not present

## 2017-08-03 DIAGNOSIS — E1142 Type 2 diabetes mellitus with diabetic polyneuropathy: Secondary | ICD-10-CM | POA: Diagnosis not present

## 2017-08-03 DIAGNOSIS — G6181 Chronic inflammatory demyelinating polyneuritis: Secondary | ICD-10-CM | POA: Diagnosis not present

## 2017-08-03 DIAGNOSIS — I480 Paroxysmal atrial fibrillation: Secondary | ICD-10-CM | POA: Diagnosis not present

## 2017-08-03 DIAGNOSIS — F3341 Major depressive disorder, recurrent, in partial remission: Secondary | ICD-10-CM | POA: Diagnosis not present

## 2017-08-05 NOTE — Progress Notes (Signed)
  Subjective:  Patient ID: Johnathan Wright, male    DOB: July 02, 1946,  MRN: [id number]  Chief Complaint  Patient presents with  . Foot Ulcer    fu ulcer on right 1st toe seems to be staying the same    71 y.o. male returns for the above complaint. Has a chronic ulcer to the right great toe. Has finished taking antibiotics that were prescribed by Dr. Cannon Kettle. States the wound is about the same.  Objective:  There were no vitals filed for this visit. General AA&O x3. Normal mood and affect.  Vascular Dorsalis pedis and posterior tibial pulses  present 1+ right Capillary refill normal to all digits. Pedal hair growth absent.  Neurologic Epicritic sensation grossly present. Protective sensation absent  Dermatologic (Wound) Wound Location: R hallux Wound Measurement: 2.5 x 2.5 x 0.1 Wound Base: Granular/Healthy Peri-wound: Calloused Exudate: None: wound tissue dry  Orthopedic: MMT 5/5 in dorsiflexion, plantarflexion, inversion, and eversion. Normal lower extremity joint ROM without pain or crepitus.    Assessment & Plan:  Patient was evaluated and treated and all questions answered.  Right Hallux Ulceration -Selective debridement of ulceration as below. -Continue off-loading shoe. Offered forefoot offloading shoe. Patient declined.  Procedure: Selective Debridement of Wound Rationale: Removal of devitalized tissue from the wound to promote healing.  Pre-Debridement Wound Measurements: 2.5 cm x 2.5 cm x 0.1 cm  Post-Debridement Wound Measurements: same as pre-debridement. Type of Debridement: Selective Tissue Removed: Devitalized soft-tissue Instrumentation: 3-0 mm dermal curette Dressing: Dry, sterile, compression dressing. Disposition: Patient tolerated procedure well. Patient to return in 1 week for follow-up.  F/u in 1 week.

## 2017-08-06 DIAGNOSIS — E1142 Type 2 diabetes mellitus with diabetic polyneuropathy: Secondary | ICD-10-CM | POA: Diagnosis not present

## 2017-08-06 DIAGNOSIS — G6181 Chronic inflammatory demyelinating polyneuritis: Secondary | ICD-10-CM | POA: Diagnosis not present

## 2017-08-06 DIAGNOSIS — E1121 Type 2 diabetes mellitus with diabetic nephropathy: Secondary | ICD-10-CM | POA: Diagnosis not present

## 2017-08-06 DIAGNOSIS — I5022 Chronic systolic (congestive) heart failure: Secondary | ICD-10-CM | POA: Diagnosis not present

## 2017-08-06 DIAGNOSIS — E11621 Type 2 diabetes mellitus with foot ulcer: Secondary | ICD-10-CM | POA: Diagnosis not present

## 2017-08-06 DIAGNOSIS — L97515 Non-pressure chronic ulcer of other part of right foot with muscle involvement without evidence of necrosis: Secondary | ICD-10-CM | POA: Diagnosis not present

## 2017-08-06 DIAGNOSIS — F3341 Major depressive disorder, recurrent, in partial remission: Secondary | ICD-10-CM | POA: Diagnosis not present

## 2017-08-06 DIAGNOSIS — G4733 Obstructive sleep apnea (adult) (pediatric): Secondary | ICD-10-CM | POA: Diagnosis not present

## 2017-08-06 DIAGNOSIS — I480 Paroxysmal atrial fibrillation: Secondary | ICD-10-CM | POA: Diagnosis not present

## 2017-08-06 NOTE — Progress Notes (Signed)
  Subjective:  Patient ID: Johnathan Wright, male    DOB: [birth year],  MRN: [id number]  Chief Complaint  Patient presents with  . Foot Ulcer    Ulcer right 1st toe is still draining and sometimes bleeds a little    71 y.o. male returns for f/u R foot wound. Believes the wound is about the same. States that it does occasionally drain and bleed a little bit.  Objective:  There were no vitals filed for this visit. General AA&O x3. Normal mood and affect.  Vascular Dorsalis pedis and posterior tibial pulses  present 1+ right Capillary refill normal to all digits. Pedal hair growth absent.  Neurologic Epicritic sensation grossly present. Protective sensation absent  Dermatologic (Wound) Wound Location: R hallux Wound Measurement: 2.5 x 2.5 x 0.1 Wound Base: Granular/Healthy Peri-wound: healthy Exudate: None: scant ss drainage. Wound progress: no change since last visit.  Orthopedic: MMT 5/5 in dorsiflexion, plantarflexion, inversion, and eversion. Normal lower extremity joint ROM without pain or crepitus.     Assessment & Plan:  Patient was evaluated and treated and all questions answered.  Right Hallux Ulceration -No debridement today. Periwound soft with fully granular wound base. -Wound base cauterized with silver nitrate. Dry dressing applied.  Procedure: Chemical Cauterization of Granulation Tissue Rationale: Cauterize granular wound base to promote healing.  Wound Measurements: 2.5 cm x 2.5 cm x 0.1 cm  Instrumentation: Silver nitrate stick x3 Dressing: Dry, sterile, compression dressing. Disposition: Patient tolerated procedure well. Patient to return in 2 weeks for follow-up.  F/u in 2 weeks.

## 2017-08-13 ENCOUNTER — Ambulatory Visit (INDEPENDENT_AMBULATORY_CARE_PROVIDER_SITE_OTHER): Payer: Medicare HMO | Admitting: Podiatry

## 2017-08-13 DIAGNOSIS — L84 Corns and callosities: Secondary | ICD-10-CM | POA: Diagnosis not present

## 2017-08-13 DIAGNOSIS — L97511 Non-pressure chronic ulcer of other part of right foot limited to breakdown of skin: Secondary | ICD-10-CM

## 2017-08-13 DIAGNOSIS — L929 Granulomatous disorder of the skin and subcutaneous tissue, unspecified: Secondary | ICD-10-CM | POA: Diagnosis not present

## 2017-08-13 DIAGNOSIS — M2042 Other hammer toe(s) (acquired), left foot: Secondary | ICD-10-CM | POA: Diagnosis not present

## 2017-08-13 DIAGNOSIS — M2041 Other hammer toe(s) (acquired), right foot: Secondary | ICD-10-CM

## 2017-08-13 DIAGNOSIS — R69 Illness, unspecified: Secondary | ICD-10-CM | POA: Diagnosis not present

## 2017-08-13 DIAGNOSIS — S90422A Blister (nonthermal), left great toe, initial encounter: Secondary | ICD-10-CM | POA: Diagnosis not present

## 2017-08-13 NOTE — Progress Notes (Signed)
  Subjective:  Patient ID: Johnathan Wright, male    DOB: October 06, 1946,  MRN: [id number]  Chief Complaint  Patient presents with  . Foot Ulcer    right 1st toe no visible changes but strong odor   . Blister    blister on top of left great toe not positive how he did it    71 y.o. male returns for f/u R foot wound. States his [facility] nurse has noted that the area is improving. Denies N/V/F/Ch.  New complaint of blister to the left foot that he is unsure how it started. States it was a blood blister and had blood come out of it. States it has not been draining.  Objective:  There were no vitals filed for this visit. General AA&O x3. Normal mood and affect.  Vascular Dorsalis pedis and posterior tibial pulses  present 1+ right Capillary refill normal to all digits. Pedal hair growth absent.  Neurologic Epicritic sensation grossly present. Protective sensation absent  Dermatologic (Wound) Wound Location: R hallux Wound Measurement: 2.0 x 2.0 x 0.1 Wound Base: hypergranular Peri-wound: hyperkeratotic Exudate: None: scant ss drainage. Wound progress: improved since last check.  L hallux flat bulla without active drainage. Left intact.  Orthopedic: MMT 5/5 in dorsiflexion, plantarflexion, inversion, and eversion. Normal lower extremity joint ROM without pain or crepitus. Lesser digital contractures bilat; reducible R, semi-reducible L     Assessment & Plan:  Patient was evaluated and treated and all questions answered.  Right Hallux Ulceration -Debridement performed as below due to significant periwound hyperkeratosis. -Excessive granulation tissue at wound base cauterized with silver nitrate. Dry dressing applied.  Procedure: Selective Debridement of Wound; chemical cauterization of wound. Rationale: Removal of devitalized tissue from the wound to promote healing. Cauterization of granular wound base to promote epithelialization. Pre-Debridement Wound Measurements: 2 cm x 2 cm x 0.1 cm   Post-Debridement Wound Measurements: same as pre-debridement. Type of Debridement: Selective Tissue Removed: Devitalized soft-tissue Instrumentation: 15 blade; silver nitrate stick x5 Dressing: Dry, sterile, compression dressing. Disposition: Patient tolerated procedure well. Patient to return in 1 week for follow-up.  L Hallux Bulla -Bulla left intact as biological barrier.  Hammertoes bilat feet with pre-ulcerative calluses -Will consider flexor tenotomy at next visit L foot d/t pre-ulcerative callus L 2nd toe.  -Patient ultimately would benefit from flexor tenotomy of R 3rd/4th toes as well.  F/u in 2 weeks.

## 2017-08-15 DIAGNOSIS — I480 Paroxysmal atrial fibrillation: Secondary | ICD-10-CM | POA: Diagnosis not present

## 2017-08-15 DIAGNOSIS — I5022 Chronic systolic (congestive) heart failure: Secondary | ICD-10-CM | POA: Diagnosis not present

## 2017-08-15 DIAGNOSIS — F3341 Major depressive disorder, recurrent, in partial remission: Secondary | ICD-10-CM | POA: Diagnosis not present

## 2017-08-15 DIAGNOSIS — E1142 Type 2 diabetes mellitus with diabetic polyneuropathy: Secondary | ICD-10-CM | POA: Diagnosis not present

## 2017-08-15 DIAGNOSIS — L97515 Non-pressure chronic ulcer of other part of right foot with muscle involvement without evidence of necrosis: Secondary | ICD-10-CM | POA: Diagnosis not present

## 2017-08-15 DIAGNOSIS — G4733 Obstructive sleep apnea (adult) (pediatric): Secondary | ICD-10-CM | POA: Diagnosis not present

## 2017-08-15 DIAGNOSIS — E1121 Type 2 diabetes mellitus with diabetic nephropathy: Secondary | ICD-10-CM | POA: Diagnosis not present

## 2017-08-15 DIAGNOSIS — E11621 Type 2 diabetes mellitus with foot ulcer: Secondary | ICD-10-CM | POA: Diagnosis not present

## 2017-08-15 DIAGNOSIS — G6181 Chronic inflammatory demyelinating polyneuritis: Secondary | ICD-10-CM | POA: Diagnosis not present

## 2017-08-22 DIAGNOSIS — E1140 Type 2 diabetes mellitus with diabetic neuropathy, unspecified: Secondary | ICD-10-CM | POA: Diagnosis not present

## 2017-08-22 DIAGNOSIS — J449 Chronic obstructive pulmonary disease, unspecified: Secondary | ICD-10-CM | POA: Diagnosis not present

## 2017-08-22 DIAGNOSIS — G894 Chronic pain syndrome: Secondary | ICD-10-CM | POA: Diagnosis not present

## 2017-08-22 DIAGNOSIS — M4696 Unspecified inflammatory spondylopathy, lumbar region: Secondary | ICD-10-CM | POA: Diagnosis not present

## 2017-08-22 DIAGNOSIS — S7000XA Contusion of unspecified hip, initial encounter: Secondary | ICD-10-CM | POA: Diagnosis not present

## 2017-08-22 DIAGNOSIS — I739 Peripheral vascular disease, unspecified: Secondary | ICD-10-CM | POA: Diagnosis not present

## 2017-08-22 DIAGNOSIS — M5136 Other intervertebral disc degeneration, lumbar region: Secondary | ICD-10-CM | POA: Diagnosis not present

## 2017-08-23 DIAGNOSIS — F3341 Major depressive disorder, recurrent, in partial remission: Secondary | ICD-10-CM | POA: Diagnosis not present

## 2017-08-23 DIAGNOSIS — E1121 Type 2 diabetes mellitus with diabetic nephropathy: Secondary | ICD-10-CM | POA: Diagnosis not present

## 2017-08-23 DIAGNOSIS — I480 Paroxysmal atrial fibrillation: Secondary | ICD-10-CM | POA: Diagnosis not present

## 2017-08-23 DIAGNOSIS — L97515 Non-pressure chronic ulcer of other part of right foot with muscle involvement without evidence of necrosis: Secondary | ICD-10-CM | POA: Diagnosis not present

## 2017-08-23 DIAGNOSIS — G6181 Chronic inflammatory demyelinating polyneuritis: Secondary | ICD-10-CM | POA: Diagnosis not present

## 2017-08-23 DIAGNOSIS — G4733 Obstructive sleep apnea (adult) (pediatric): Secondary | ICD-10-CM | POA: Diagnosis not present

## 2017-08-23 DIAGNOSIS — E11621 Type 2 diabetes mellitus with foot ulcer: Secondary | ICD-10-CM | POA: Diagnosis not present

## 2017-08-23 DIAGNOSIS — E1142 Type 2 diabetes mellitus with diabetic polyneuropathy: Secondary | ICD-10-CM | POA: Diagnosis not present

## 2017-08-23 DIAGNOSIS — I5022 Chronic systolic (congestive) heart failure: Secondary | ICD-10-CM | POA: Diagnosis not present

## 2017-08-27 ENCOUNTER — Ambulatory Visit (INDEPENDENT_AMBULATORY_CARE_PROVIDER_SITE_OTHER): Payer: Medicare HMO | Admitting: Podiatry

## 2017-08-27 ENCOUNTER — Encounter: Payer: Self-pay | Admitting: Podiatry

## 2017-08-27 DIAGNOSIS — L97511 Non-pressure chronic ulcer of other part of right foot limited to breakdown of skin: Secondary | ICD-10-CM

## 2017-08-27 DIAGNOSIS — M2042 Other hammer toe(s) (acquired), left foot: Secondary | ICD-10-CM

## 2017-08-27 DIAGNOSIS — S90422D Blister (nonthermal), left great toe, subsequent encounter: Secondary | ICD-10-CM | POA: Diagnosis not present

## 2017-08-27 DIAGNOSIS — M2041 Other hammer toe(s) (acquired), right foot: Secondary | ICD-10-CM

## 2017-08-27 NOTE — Progress Notes (Signed)
  Subjective:  Patient ID: Johnathan Wright, male    DOB: May 13, 1946,  MRN: [id number]  Chief Complaint  Patient presents with  . Foot Ulcer    fu right 1st toe ulcer  to is macerated and bandage is soaked  bandage does not look to have been changed in a few days    71 y.o. male returns for f/u bilateral foot wounds. Presents with soaked and malodorous dressing. Wound macerated. Denies new complaints. States that the L great toe has healed.  Objective:  There were no vitals filed for this visit. General AA&O x3. Normal mood and affect.  Vascular Dorsalis pedis and posterior tibial pulses  present 1+ right Capillary refill normal to all digits. Pedal hair growth absent.  Neurologic Epicritic sensation grossly present. Protective sensation absent  Dermatologic (Wound) Wound Location: R hallux Wound Measurement: 2.0 x 2.0 x 0.1 Wound Base: hypergranular Peri-wound: hyperkeratotic, macerated. Exudate: None: scant ss drainage. Wound progress: improved since last check.  L hallux bulla resolved.  R 3rd with pinpoint callus upon debridement. R 4th toe with pre-ulcerative callus. L 2nd toe pre-ulcerative callus noted.  Orthopedic: MMT 5/5 in dorsiflexion, plantarflexion, inversion, and eversion. Normal lower extremity joint ROM without pain or crepitus. Lesser digital contractures bilat; reducible R, semi-reducible L    Assessment & Plan:  Patient was evaluated and treated and all questions answered.  Right Hallux Ulceration -Debridement performed as below due to significant periwound hyperkeratosis. See below. -Silicone toe cap dispensed.  Procedure: Selective Debridement of Wound Rationale: Removal of devitalized tissue from the wound to promote healing.  Pre-Debridement Wound Measurements: 2.5 cm x 2.5 cm x 0.1 cm  Post-Debridement Wound Measurements: same as pre-debridement. Type of Debridement: Selective Tissue Removed: Devitalized soft-tissue Instrumentation: 15  blade Dressing: Cauterization with silver nitrate sticks x5. Dry, sterile, compression dressing. Disposition: Patient tolerated procedure well. Patient to return in 2 week for follow-up.  L Hallux Bulla -Resolved. -Will continue to monitor.  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  R 3rd hammertoe with pre-ulcerative callus. --Percutaneous flexor tenotomy performed today. Verbal consent obtained. See procedure note below.  Procedure: Flexor Tenotomy Indication for Procedure: toe with semi-reducible hammertoe with pre-ulcerative distal tip ulcer. Flexor tenotomy indicated to alleviate contracture, reduce pressure, and enhance healing of the ulceration. Location: right, 3rd toe Anesthesia: Marcaine 0.5% plain; 8mL digital block Instrumentation: 18g needle Technique: The toe was anesthetized as above and prepped in the usual fashion. The toe was exanquinated and a tourniquet was secured at the base of the toe. An 18 g needle was used to release the flexor tendon with some release of the hammertoe deformity noted. The site was then dressed with a band-aid. Dressing: DSD Disposition: Patient tolerated procedure well. Patient to return in 2 week for follow-up.  F/u in 2 weeks.

## 2017-08-29 ENCOUNTER — Telehealth: Payer: Self-pay | Admitting: Podiatry

## 2017-08-29 NOTE — Telephone Encounter (Signed)
This is Johnathan Wright with [facility]. I wanted to change his wound care. Dr. Tobie Poet has been giving me orders but I didn't know if you guys wanted me to do anything different since you're seeing him for that toe now. If you would, give me a call at [phone]. Thank you.

## 2017-08-30 DIAGNOSIS — F332 Major depressive disorder, recurrent severe without psychotic features: Secondary | ICD-10-CM | POA: Diagnosis not present

## 2017-08-30 DIAGNOSIS — L97515 Non-pressure chronic ulcer of other part of right foot with muscle involvement without evidence of necrosis: Secondary | ICD-10-CM | POA: Diagnosis not present

## 2017-08-30 DIAGNOSIS — I482 Chronic atrial fibrillation: Secondary | ICD-10-CM | POA: Diagnosis not present

## 2017-08-30 DIAGNOSIS — Z6841 Body Mass Index (BMI) 40.0 and over, adult: Secondary | ICD-10-CM | POA: Diagnosis not present

## 2017-08-30 DIAGNOSIS — M545 Low back pain: Secondary | ICD-10-CM | POA: Diagnosis not present

## 2017-08-30 DIAGNOSIS — E1121 Type 2 diabetes mellitus with diabetic nephropathy: Secondary | ICD-10-CM | POA: Diagnosis not present

## 2017-08-30 DIAGNOSIS — G6181 Chronic inflammatory demyelinating polyneuritis: Secondary | ICD-10-CM | POA: Diagnosis not present

## 2017-08-30 DIAGNOSIS — E782 Mixed hyperlipidemia: Secondary | ICD-10-CM | POA: Diagnosis not present

## 2017-08-31 DIAGNOSIS — I5022 Chronic systolic (congestive) heart failure: Secondary | ICD-10-CM | POA: Diagnosis not present

## 2017-08-31 DIAGNOSIS — G4733 Obstructive sleep apnea (adult) (pediatric): Secondary | ICD-10-CM | POA: Diagnosis not present

## 2017-08-31 DIAGNOSIS — I480 Paroxysmal atrial fibrillation: Secondary | ICD-10-CM | POA: Diagnosis not present

## 2017-08-31 DIAGNOSIS — L97515 Non-pressure chronic ulcer of other part of right foot with muscle involvement without evidence of necrosis: Secondary | ICD-10-CM | POA: Diagnosis not present

## 2017-08-31 DIAGNOSIS — E11621 Type 2 diabetes mellitus with foot ulcer: Secondary | ICD-10-CM | POA: Diagnosis not present

## 2017-08-31 DIAGNOSIS — G6181 Chronic inflammatory demyelinating polyneuritis: Secondary | ICD-10-CM | POA: Diagnosis not present

## 2017-08-31 DIAGNOSIS — F3341 Major depressive disorder, recurrent, in partial remission: Secondary | ICD-10-CM | POA: Diagnosis not present

## 2017-08-31 DIAGNOSIS — E1142 Type 2 diabetes mellitus with diabetic polyneuropathy: Secondary | ICD-10-CM | POA: Diagnosis not present

## 2017-08-31 DIAGNOSIS — E1121 Type 2 diabetes mellitus with diabetic nephropathy: Secondary | ICD-10-CM | POA: Diagnosis not present

## 2017-09-05 DIAGNOSIS — E1121 Type 2 diabetes mellitus with diabetic nephropathy: Secondary | ICD-10-CM | POA: Diagnosis not present

## 2017-09-05 DIAGNOSIS — I5022 Chronic systolic (congestive) heart failure: Secondary | ICD-10-CM | POA: Diagnosis not present

## 2017-09-05 DIAGNOSIS — F3341 Major depressive disorder, recurrent, in partial remission: Secondary | ICD-10-CM | POA: Diagnosis not present

## 2017-09-05 DIAGNOSIS — E1142 Type 2 diabetes mellitus with diabetic polyneuropathy: Secondary | ICD-10-CM | POA: Diagnosis not present

## 2017-09-05 DIAGNOSIS — G6181 Chronic inflammatory demyelinating polyneuritis: Secondary | ICD-10-CM | POA: Diagnosis not present

## 2017-09-05 DIAGNOSIS — G4733 Obstructive sleep apnea (adult) (pediatric): Secondary | ICD-10-CM | POA: Diagnosis not present

## 2017-09-05 DIAGNOSIS — E11621 Type 2 diabetes mellitus with foot ulcer: Secondary | ICD-10-CM | POA: Diagnosis not present

## 2017-09-05 DIAGNOSIS — I480 Paroxysmal atrial fibrillation: Secondary | ICD-10-CM | POA: Diagnosis not present

## 2017-09-05 DIAGNOSIS — L97515 Non-pressure chronic ulcer of other part of right foot with muscle involvement without evidence of necrosis: Secondary | ICD-10-CM | POA: Diagnosis not present

## 2017-09-10 ENCOUNTER — Ambulatory Visit: Payer: Medicare HMO | Admitting: Podiatry

## 2017-09-12 DIAGNOSIS — E1142 Type 2 diabetes mellitus with diabetic polyneuropathy: Secondary | ICD-10-CM | POA: Diagnosis not present

## 2017-09-12 DIAGNOSIS — G6181 Chronic inflammatory demyelinating polyneuritis: Secondary | ICD-10-CM | POA: Diagnosis not present

## 2017-09-12 DIAGNOSIS — F3341 Major depressive disorder, recurrent, in partial remission: Secondary | ICD-10-CM | POA: Diagnosis not present

## 2017-09-12 DIAGNOSIS — L97515 Non-pressure chronic ulcer of other part of right foot with muscle involvement without evidence of necrosis: Secondary | ICD-10-CM | POA: Diagnosis not present

## 2017-09-12 DIAGNOSIS — I5022 Chronic systolic (congestive) heart failure: Secondary | ICD-10-CM | POA: Diagnosis not present

## 2017-09-12 DIAGNOSIS — E1121 Type 2 diabetes mellitus with diabetic nephropathy: Secondary | ICD-10-CM | POA: Diagnosis not present

## 2017-09-12 DIAGNOSIS — E11621 Type 2 diabetes mellitus with foot ulcer: Secondary | ICD-10-CM | POA: Diagnosis not present

## 2017-09-12 DIAGNOSIS — I480 Paroxysmal atrial fibrillation: Secondary | ICD-10-CM | POA: Diagnosis not present

## 2017-09-12 DIAGNOSIS — G4733 Obstructive sleep apnea (adult) (pediatric): Secondary | ICD-10-CM | POA: Diagnosis not present

## 2017-09-14 DIAGNOSIS — G6181 Chronic inflammatory demyelinating polyneuritis: Secondary | ICD-10-CM | POA: Diagnosis not present

## 2017-09-14 DIAGNOSIS — E1121 Type 2 diabetes mellitus with diabetic nephropathy: Secondary | ICD-10-CM | POA: Diagnosis not present

## 2017-09-14 DIAGNOSIS — E11621 Type 2 diabetes mellitus with foot ulcer: Secondary | ICD-10-CM | POA: Diagnosis not present

## 2017-09-14 DIAGNOSIS — I5022 Chronic systolic (congestive) heart failure: Secondary | ICD-10-CM | POA: Diagnosis not present

## 2017-09-14 DIAGNOSIS — L97515 Non-pressure chronic ulcer of other part of right foot with muscle involvement without evidence of necrosis: Secondary | ICD-10-CM | POA: Diagnosis not present

## 2017-09-14 DIAGNOSIS — E1142 Type 2 diabetes mellitus with diabetic polyneuropathy: Secondary | ICD-10-CM | POA: Diagnosis not present

## 2017-09-14 DIAGNOSIS — I480 Paroxysmal atrial fibrillation: Secondary | ICD-10-CM | POA: Diagnosis not present

## 2017-09-14 DIAGNOSIS — G4733 Obstructive sleep apnea (adult) (pediatric): Secondary | ICD-10-CM | POA: Diagnosis not present

## 2017-09-14 DIAGNOSIS — F3341 Major depressive disorder, recurrent, in partial remission: Secondary | ICD-10-CM | POA: Diagnosis not present

## 2017-09-18 ENCOUNTER — Telehealth: Payer: Self-pay | Admitting: Sports Medicine

## 2017-09-18 NOTE — Telephone Encounter (Signed)
Per Dr March Rummage wound care orders for every other day silvadene dressing

## 2017-09-18 NOTE — Telephone Encounter (Signed)
Patient was last seen by Dr. March Rummage. Please contact him to see what wound care orders are needed for this patient

## 2017-09-18 NOTE — Telephone Encounter (Signed)
Johnathan Wright, sister, calls to say that Johnathan Wright, has been unsuccessful in getting orders on patient.

## 2017-09-21 DIAGNOSIS — E1121 Type 2 diabetes mellitus with diabetic nephropathy: Secondary | ICD-10-CM | POA: Diagnosis not present

## 2017-09-21 DIAGNOSIS — G4733 Obstructive sleep apnea (adult) (pediatric): Secondary | ICD-10-CM | POA: Diagnosis not present

## 2017-09-21 DIAGNOSIS — I5022 Chronic systolic (congestive) heart failure: Secondary | ICD-10-CM | POA: Diagnosis not present

## 2017-09-21 DIAGNOSIS — I480 Paroxysmal atrial fibrillation: Secondary | ICD-10-CM | POA: Diagnosis not present

## 2017-09-21 DIAGNOSIS — E1142 Type 2 diabetes mellitus with diabetic polyneuropathy: Secondary | ICD-10-CM | POA: Diagnosis not present

## 2017-09-21 DIAGNOSIS — F3341 Major depressive disorder, recurrent, in partial remission: Secondary | ICD-10-CM | POA: Diagnosis not present

## 2017-09-21 DIAGNOSIS — E11621 Type 2 diabetes mellitus with foot ulcer: Secondary | ICD-10-CM | POA: Diagnosis not present

## 2017-09-21 DIAGNOSIS — L97515 Non-pressure chronic ulcer of other part of right foot with muscle involvement without evidence of necrosis: Secondary | ICD-10-CM | POA: Diagnosis not present

## 2017-09-21 DIAGNOSIS — G6181 Chronic inflammatory demyelinating polyneuritis: Secondary | ICD-10-CM | POA: Diagnosis not present

## 2017-09-24 ENCOUNTER — Ambulatory Visit: Payer: Medicare HMO | Admitting: Podiatry

## 2017-09-24 DIAGNOSIS — J449 Chronic obstructive pulmonary disease, unspecified: Secondary | ICD-10-CM | POA: Diagnosis not present

## 2017-09-24 DIAGNOSIS — G894 Chronic pain syndrome: Secondary | ICD-10-CM | POA: Diagnosis not present

## 2017-09-24 DIAGNOSIS — E1140 Type 2 diabetes mellitus with diabetic neuropathy, unspecified: Secondary | ICD-10-CM | POA: Diagnosis not present

## 2017-09-24 DIAGNOSIS — I739 Peripheral vascular disease, unspecified: Secondary | ICD-10-CM | POA: Diagnosis not present

## 2017-09-24 DIAGNOSIS — M5136 Other intervertebral disc degeneration, lumbar region: Secondary | ICD-10-CM | POA: Diagnosis not present

## 2017-09-24 DIAGNOSIS — S7000XA Contusion of unspecified hip, initial encounter: Secondary | ICD-10-CM | POA: Diagnosis not present

## 2017-09-27 DIAGNOSIS — E1121 Type 2 diabetes mellitus with diabetic nephropathy: Secondary | ICD-10-CM | POA: Diagnosis not present

## 2017-09-27 DIAGNOSIS — F3341 Major depressive disorder, recurrent, in partial remission: Secondary | ICD-10-CM | POA: Diagnosis not present

## 2017-09-27 DIAGNOSIS — E1142 Type 2 diabetes mellitus with diabetic polyneuropathy: Secondary | ICD-10-CM | POA: Diagnosis not present

## 2017-09-27 DIAGNOSIS — E11621 Type 2 diabetes mellitus with foot ulcer: Secondary | ICD-10-CM | POA: Diagnosis not present

## 2017-09-27 DIAGNOSIS — G4733 Obstructive sleep apnea (adult) (pediatric): Secondary | ICD-10-CM | POA: Diagnosis not present

## 2017-09-27 DIAGNOSIS — I480 Paroxysmal atrial fibrillation: Secondary | ICD-10-CM | POA: Diagnosis not present

## 2017-09-27 DIAGNOSIS — L97515 Non-pressure chronic ulcer of other part of right foot with muscle involvement without evidence of necrosis: Secondary | ICD-10-CM | POA: Diagnosis not present

## 2017-09-27 DIAGNOSIS — I5022 Chronic systolic (congestive) heart failure: Secondary | ICD-10-CM | POA: Diagnosis not present

## 2017-09-27 DIAGNOSIS — G6181 Chronic inflammatory demyelinating polyneuritis: Secondary | ICD-10-CM | POA: Diagnosis not present

## 2017-10-03 DIAGNOSIS — E11621 Type 2 diabetes mellitus with foot ulcer: Secondary | ICD-10-CM | POA: Diagnosis not present

## 2017-10-03 DIAGNOSIS — E1142 Type 2 diabetes mellitus with diabetic polyneuropathy: Secondary | ICD-10-CM | POA: Diagnosis not present

## 2017-10-03 DIAGNOSIS — I5022 Chronic systolic (congestive) heart failure: Secondary | ICD-10-CM | POA: Diagnosis not present

## 2017-10-03 DIAGNOSIS — G4733 Obstructive sleep apnea (adult) (pediatric): Secondary | ICD-10-CM | POA: Diagnosis not present

## 2017-10-03 DIAGNOSIS — F3341 Major depressive disorder, recurrent, in partial remission: Secondary | ICD-10-CM | POA: Diagnosis not present

## 2017-10-03 DIAGNOSIS — I480 Paroxysmal atrial fibrillation: Secondary | ICD-10-CM | POA: Diagnosis not present

## 2017-10-03 DIAGNOSIS — G6181 Chronic inflammatory demyelinating polyneuritis: Secondary | ICD-10-CM | POA: Diagnosis not present

## 2017-10-03 DIAGNOSIS — E1121 Type 2 diabetes mellitus with diabetic nephropathy: Secondary | ICD-10-CM | POA: Diagnosis not present

## 2017-10-03 DIAGNOSIS — L97515 Non-pressure chronic ulcer of other part of right foot with muscle involvement without evidence of necrosis: Secondary | ICD-10-CM | POA: Diagnosis not present

## 2017-10-10 DIAGNOSIS — L97515 Non-pressure chronic ulcer of other part of right foot with muscle involvement without evidence of necrosis: Secondary | ICD-10-CM | POA: Diagnosis not present

## 2017-10-10 DIAGNOSIS — E1142 Type 2 diabetes mellitus with diabetic polyneuropathy: Secondary | ICD-10-CM | POA: Diagnosis not present

## 2017-10-10 DIAGNOSIS — E1121 Type 2 diabetes mellitus with diabetic nephropathy: Secondary | ICD-10-CM | POA: Diagnosis not present

## 2017-10-10 DIAGNOSIS — I480 Paroxysmal atrial fibrillation: Secondary | ICD-10-CM | POA: Diagnosis not present

## 2017-10-10 DIAGNOSIS — G4733 Obstructive sleep apnea (adult) (pediatric): Secondary | ICD-10-CM | POA: Diagnosis not present

## 2017-10-10 DIAGNOSIS — I5022 Chronic systolic (congestive) heart failure: Secondary | ICD-10-CM | POA: Diagnosis not present

## 2017-10-10 DIAGNOSIS — E11621 Type 2 diabetes mellitus with foot ulcer: Secondary | ICD-10-CM | POA: Diagnosis not present

## 2017-10-10 DIAGNOSIS — F3341 Major depressive disorder, recurrent, in partial remission: Secondary | ICD-10-CM | POA: Diagnosis not present

## 2017-10-10 DIAGNOSIS — G6181 Chronic inflammatory demyelinating polyneuritis: Secondary | ICD-10-CM | POA: Diagnosis not present

## 2017-10-17 DIAGNOSIS — G4733 Obstructive sleep apnea (adult) (pediatric): Secondary | ICD-10-CM | POA: Diagnosis not present

## 2017-10-17 DIAGNOSIS — I480 Paroxysmal atrial fibrillation: Secondary | ICD-10-CM | POA: Diagnosis not present

## 2017-10-17 DIAGNOSIS — G6181 Chronic inflammatory demyelinating polyneuritis: Secondary | ICD-10-CM | POA: Diagnosis not present

## 2017-10-17 DIAGNOSIS — E1142 Type 2 diabetes mellitus with diabetic polyneuropathy: Secondary | ICD-10-CM | POA: Diagnosis not present

## 2017-10-17 DIAGNOSIS — L97515 Non-pressure chronic ulcer of other part of right foot with muscle involvement without evidence of necrosis: Secondary | ICD-10-CM | POA: Diagnosis not present

## 2017-10-17 DIAGNOSIS — E11621 Type 2 diabetes mellitus with foot ulcer: Secondary | ICD-10-CM | POA: Diagnosis not present

## 2017-10-17 DIAGNOSIS — E1121 Type 2 diabetes mellitus with diabetic nephropathy: Secondary | ICD-10-CM | POA: Diagnosis not present

## 2017-10-17 DIAGNOSIS — I5022 Chronic systolic (congestive) heart failure: Secondary | ICD-10-CM | POA: Diagnosis not present

## 2017-10-17 DIAGNOSIS — F3341 Major depressive disorder, recurrent, in partial remission: Secondary | ICD-10-CM | POA: Diagnosis not present

## 2017-10-19 DIAGNOSIS — M86171 Other acute osteomyelitis, right ankle and foot: Secondary | ICD-10-CM | POA: Diagnosis not present

## 2017-10-19 DIAGNOSIS — E1165 Type 2 diabetes mellitus with hyperglycemia: Secondary | ICD-10-CM | POA: Diagnosis not present

## 2017-10-19 DIAGNOSIS — L03031 Cellulitis of right toe: Secondary | ICD-10-CM | POA: Diagnosis not present

## 2017-10-19 DIAGNOSIS — N179 Acute kidney failure, unspecified: Secondary | ICD-10-CM | POA: Diagnosis not present

## 2017-10-19 DIAGNOSIS — A419 Sepsis, unspecified organism: Secondary | ICD-10-CM | POA: Diagnosis not present

## 2017-10-19 DIAGNOSIS — L97518 Non-pressure chronic ulcer of other part of right foot with other specified severity: Secondary | ICD-10-CM | POA: Diagnosis not present

## 2017-10-19 DIAGNOSIS — L03116 Cellulitis of left lower limb: Secondary | ICD-10-CM | POA: Diagnosis not present

## 2017-10-19 DIAGNOSIS — L03115 Cellulitis of right lower limb: Secondary | ICD-10-CM | POA: Diagnosis not present

## 2017-10-19 DIAGNOSIS — I2510 Atherosclerotic heart disease of native coronary artery without angina pectoris: Secondary | ICD-10-CM | POA: Diagnosis not present

## 2017-10-19 DIAGNOSIS — I509 Heart failure, unspecified: Secondary | ICD-10-CM | POA: Diagnosis not present

## 2017-10-19 DIAGNOSIS — L97501 Non-pressure chronic ulcer of other part of unspecified foot limited to breakdown of skin: Secondary | ICD-10-CM | POA: Diagnosis not present

## 2017-10-19 DIAGNOSIS — L97511 Non-pressure chronic ulcer of other part of right foot limited to breakdown of skin: Secondary | ICD-10-CM | POA: Diagnosis not present

## 2017-10-19 DIAGNOSIS — E1151 Type 2 diabetes mellitus with diabetic peripheral angiopathy without gangrene: Secondary | ICD-10-CM | POA: Diagnosis not present

## 2017-10-19 DIAGNOSIS — I10 Essential (primary) hypertension: Secondary | ICD-10-CM | POA: Diagnosis not present

## 2017-10-19 DIAGNOSIS — Z6841 Body Mass Index (BMI) 40.0 and over, adult: Secondary | ICD-10-CM | POA: Diagnosis not present

## 2017-10-19 DIAGNOSIS — M25474 Effusion, right foot: Secondary | ICD-10-CM | POA: Diagnosis not present

## 2017-10-19 DIAGNOSIS — M7989 Other specified soft tissue disorders: Secondary | ICD-10-CM | POA: Diagnosis not present

## 2017-10-19 DIAGNOSIS — M86172 Other acute osteomyelitis, left ankle and foot: Secondary | ICD-10-CM | POA: Diagnosis not present

## 2017-10-19 DIAGNOSIS — E11621 Type 2 diabetes mellitus with foot ulcer: Secondary | ICD-10-CM | POA: Diagnosis not present

## 2017-10-19 DIAGNOSIS — M25475 Effusion, left foot: Secondary | ICD-10-CM | POA: Diagnosis not present

## 2017-10-19 DIAGNOSIS — G6181 Chronic inflammatory demyelinating polyneuritis: Secondary | ICD-10-CM | POA: Diagnosis not present

## 2017-10-19 DIAGNOSIS — M009 Pyogenic arthritis, unspecified: Secondary | ICD-10-CM | POA: Diagnosis not present

## 2017-10-19 DIAGNOSIS — M869 Osteomyelitis, unspecified: Secondary | ICD-10-CM | POA: Diagnosis not present

## 2017-10-19 DIAGNOSIS — M898X7 Other specified disorders of bone, ankle and foot: Secondary | ICD-10-CM | POA: Diagnosis not present

## 2017-10-19 DIAGNOSIS — E1140 Type 2 diabetes mellitus with diabetic neuropathy, unspecified: Secondary | ICD-10-CM | POA: Diagnosis not present

## 2017-10-19 DIAGNOSIS — G4751 Confusional arousals: Secondary | ICD-10-CM | POA: Diagnosis not present

## 2017-10-19 DIAGNOSIS — L97528 Non-pressure chronic ulcer of other part of left foot with other specified severity: Secondary | ICD-10-CM | POA: Diagnosis not present

## 2017-10-20 DIAGNOSIS — E1165 Type 2 diabetes mellitus with hyperglycemia: Secondary | ICD-10-CM | POA: Diagnosis not present

## 2017-10-20 DIAGNOSIS — E11621 Type 2 diabetes mellitus with foot ulcer: Secondary | ICD-10-CM | POA: Diagnosis not present

## 2017-10-20 DIAGNOSIS — M869 Osteomyelitis, unspecified: Secondary | ICD-10-CM | POA: Diagnosis not present

## 2017-10-20 DIAGNOSIS — E1151 Type 2 diabetes mellitus with diabetic peripheral angiopathy without gangrene: Secondary | ICD-10-CM | POA: Diagnosis not present

## 2017-10-22 ENCOUNTER — Ambulatory Visit: Payer: Medicare HMO | Admitting: Podiatry

## 2017-10-22 DIAGNOSIS — I509 Heart failure, unspecified: Secondary | ICD-10-CM | POA: Diagnosis not present

## 2017-10-22 DIAGNOSIS — M86171 Other acute osteomyelitis, right ankle and foot: Secondary | ICD-10-CM | POA: Diagnosis not present

## 2017-10-22 DIAGNOSIS — M86172 Other acute osteomyelitis, left ankle and foot: Secondary | ICD-10-CM | POA: Diagnosis not present

## 2017-10-24 DIAGNOSIS — G4733 Obstructive sleep apnea (adult) (pediatric): Secondary | ICD-10-CM | POA: Diagnosis not present

## 2017-10-24 DIAGNOSIS — L97515 Non-pressure chronic ulcer of other part of right foot with muscle involvement without evidence of necrosis: Secondary | ICD-10-CM | POA: Diagnosis not present

## 2017-10-24 DIAGNOSIS — E1121 Type 2 diabetes mellitus with diabetic nephropathy: Secondary | ICD-10-CM | POA: Diagnosis not present

## 2017-10-24 DIAGNOSIS — Z982 Presence of cerebrospinal fluid drainage device: Secondary | ICD-10-CM | POA: Diagnosis not present

## 2017-10-24 DIAGNOSIS — I480 Paroxysmal atrial fibrillation: Secondary | ICD-10-CM | POA: Diagnosis not present

## 2017-10-24 DIAGNOSIS — G6181 Chronic inflammatory demyelinating polyneuritis: Secondary | ICD-10-CM | POA: Diagnosis not present

## 2017-10-24 DIAGNOSIS — G910 Communicating hydrocephalus: Secondary | ICD-10-CM | POA: Diagnosis not present

## 2017-10-24 DIAGNOSIS — E1142 Type 2 diabetes mellitus with diabetic polyneuropathy: Secondary | ICD-10-CM | POA: Diagnosis not present

## 2017-10-24 DIAGNOSIS — I5022 Chronic systolic (congestive) heart failure: Secondary | ICD-10-CM | POA: Diagnosis not present

## 2017-10-24 DIAGNOSIS — E11621 Type 2 diabetes mellitus with foot ulcer: Secondary | ICD-10-CM | POA: Diagnosis not present

## 2017-10-24 DIAGNOSIS — Z87891 Personal history of nicotine dependence: Secondary | ICD-10-CM | POA: Diagnosis not present

## 2017-10-25 DIAGNOSIS — E1121 Type 2 diabetes mellitus with diabetic nephropathy: Secondary | ICD-10-CM | POA: Diagnosis not present

## 2017-10-25 DIAGNOSIS — I480 Paroxysmal atrial fibrillation: Secondary | ICD-10-CM | POA: Diagnosis not present

## 2017-10-25 DIAGNOSIS — Z982 Presence of cerebrospinal fluid drainage device: Secondary | ICD-10-CM | POA: Diagnosis not present

## 2017-10-25 DIAGNOSIS — I5022 Chronic systolic (congestive) heart failure: Secondary | ICD-10-CM | POA: Diagnosis not present

## 2017-10-25 DIAGNOSIS — G4733 Obstructive sleep apnea (adult) (pediatric): Secondary | ICD-10-CM | POA: Diagnosis not present

## 2017-10-25 DIAGNOSIS — E1142 Type 2 diabetes mellitus with diabetic polyneuropathy: Secondary | ICD-10-CM | POA: Diagnosis not present

## 2017-10-25 DIAGNOSIS — G6181 Chronic inflammatory demyelinating polyneuritis: Secondary | ICD-10-CM | POA: Diagnosis not present

## 2017-10-25 DIAGNOSIS — E11621 Type 2 diabetes mellitus with foot ulcer: Secondary | ICD-10-CM | POA: Diagnosis not present

## 2017-10-25 DIAGNOSIS — L97515 Non-pressure chronic ulcer of other part of right foot with muscle involvement without evidence of necrosis: Secondary | ICD-10-CM | POA: Diagnosis not present

## 2017-10-25 DIAGNOSIS — Z87891 Personal history of nicotine dependence: Secondary | ICD-10-CM | POA: Diagnosis not present

## 2017-10-25 DIAGNOSIS — G910 Communicating hydrocephalus: Secondary | ICD-10-CM | POA: Diagnosis not present

## 2017-10-29 ENCOUNTER — Ambulatory Visit: Payer: Medicare HMO | Admitting: Podiatry

## 2017-10-29 DIAGNOSIS — Z87891 Personal history of nicotine dependence: Secondary | ICD-10-CM | POA: Diagnosis not present

## 2017-10-29 DIAGNOSIS — G6181 Chronic inflammatory demyelinating polyneuritis: Secondary | ICD-10-CM | POA: Diagnosis not present

## 2017-10-29 DIAGNOSIS — I480 Paroxysmal atrial fibrillation: Secondary | ICD-10-CM | POA: Diagnosis not present

## 2017-10-29 DIAGNOSIS — E1121 Type 2 diabetes mellitus with diabetic nephropathy: Secondary | ICD-10-CM | POA: Diagnosis not present

## 2017-10-29 DIAGNOSIS — I5022 Chronic systolic (congestive) heart failure: Secondary | ICD-10-CM | POA: Diagnosis not present

## 2017-10-29 DIAGNOSIS — E11621 Type 2 diabetes mellitus with foot ulcer: Secondary | ICD-10-CM | POA: Diagnosis not present

## 2017-10-29 DIAGNOSIS — Z982 Presence of cerebrospinal fluid drainage device: Secondary | ICD-10-CM | POA: Diagnosis not present

## 2017-10-29 DIAGNOSIS — G910 Communicating hydrocephalus: Secondary | ICD-10-CM | POA: Diagnosis not present

## 2017-10-29 DIAGNOSIS — L97515 Non-pressure chronic ulcer of other part of right foot with muscle involvement without evidence of necrosis: Secondary | ICD-10-CM | POA: Diagnosis not present

## 2017-10-29 DIAGNOSIS — G4733 Obstructive sleep apnea (adult) (pediatric): Secondary | ICD-10-CM | POA: Diagnosis not present

## 2017-10-29 DIAGNOSIS — E1142 Type 2 diabetes mellitus with diabetic polyneuropathy: Secondary | ICD-10-CM | POA: Diagnosis not present

## 2017-10-30 ENCOUNTER — Ambulatory Visit (INDEPENDENT_AMBULATORY_CARE_PROVIDER_SITE_OTHER): Payer: Medicare Other | Admitting: Podiatry

## 2017-10-30 ENCOUNTER — Telehealth: Payer: Self-pay | Admitting: Podiatry

## 2017-10-30 DIAGNOSIS — L97521 Non-pressure chronic ulcer of other part of left foot limited to breakdown of skin: Secondary | ICD-10-CM

## 2017-10-30 DIAGNOSIS — L97514 Non-pressure chronic ulcer of other part of right foot with necrosis of bone: Secondary | ICD-10-CM

## 2017-10-30 DIAGNOSIS — E1140 Type 2 diabetes mellitus with diabetic neuropathy, unspecified: Secondary | ICD-10-CM

## 2017-10-30 DIAGNOSIS — M86171 Other acute osteomyelitis, right ankle and foot: Secondary | ICD-10-CM | POA: Diagnosis not present

## 2017-10-30 DIAGNOSIS — Z794 Long term (current) use of insulin: Secondary | ICD-10-CM | POA: Diagnosis not present

## 2017-10-30 NOTE — Telephone Encounter (Signed)
Left message informing Dr. March Rummage of [facility] nurse evaluation, and called [place] office asked C. Bedington to ask Dr. Eleanora Neighbor orders. Merryl Hacker states Dr. March Rummage said have pt come in at 4:15pm.

## 2017-10-30 NOTE — Telephone Encounter (Signed)
I informed pt of Dr. March Rummage and his [facility] nurse Gail's concern and appt today at 4:15PM. Pt states his healthcare provider is not available, he will call her and if he can not make the appt he will call me again.

## 2017-10-30 NOTE — Telephone Encounter (Signed)
Hey, this is [name] with [facility]. He had surgery on his right foot with an open wound and there is an area that is coming out that may be bone. I was wondering if maybe the doctor needed to see him quicker than next week. Also, I was wanting to know if I could switch him from the betadine to the medihoney as that has worked really well on his right toe at least on the wounds on the toes on the right foot and on the bottom of the left foot. Please call me back at [phone]. Thank you.

## 2017-10-31 ENCOUNTER — Telehealth: Payer: Self-pay | Admitting: *Deleted

## 2017-10-31 NOTE — Progress Notes (Signed)
  Subjective:  Patient ID: Johnathan Wright, male    DOB: January 20, 1946,  MRN: [id number]  Chief Complaint  Patient presents with  . Foot Ulcer    early fu at req of HH bone protruding from ulcer site right 1st    71 y.o. male returns for wound care.  Received a phone call from home health stating that exposed bone was noted in the patient's wound.  Patient underwent surgical debridement of the right foot on 10/22/17. Denies N/V/F/Ch.  Still taking abx prescribed at discharge.  Objective:  There were no vitals filed for this visit. General AA&O x3. Normal mood and affect.  Vascular Foot warm to touch.  Neurologic Sensation grossly diminished.  Dermatologic (Wound) Wound Location: Lt. foot sub 1st MPJ Wound Measurement: 1x1 Wound Base: Granular/Healthy Peri-wound: Normal Exudate: None: wound tissue dry  Wound Location: Rt. foot 1st MPJ Wound Measurement: approx 4.5 x 4.5 x 1.0; with exposed tibial sesamoid bone. Wound Base: Fibrotic slough Peri-wound: Reddened Exudate: Moderate amount Serosanguinous exudate  Wound Location: Rt. foot 2nd toe Wound Measurement: 0.5x0.5 Wound Base: Granular/Healthy Peri-wound: Normal Exudate: None: wound tissue dry  Orthopedic: No pain to palpation either foot.   Assessment & Plan:  Patient was evaluated and treated and all questions answered.  Ulcer L Sub-met 1 -Dressed with medihoney, DSD.  Ulcer R 2nd toe -Dressed with medihoney, DSD.  Ulcer R 1st MPJ medial -Ulceration probes deep with exposed bone. Discussed the need for excision of the exposed bone. Patient verbalized agreement and wished to proceed. -See separate procedure note. -Continue current abx. -Due to the depth of the wound, will initiate VAC therapy. Will contact [place] with orders.  Venous Insufficiency -Compression wraps applied. -Recc HHC apply multilayer compression wraps. Will send orders.

## 2017-10-31 NOTE — Progress Notes (Signed)
Patient Name: Johnathan Wright DOB: [birth year]  MRN: [id number]  Date of Service: @DATE @  Surgeon: Dr. Hardie Pulley, DPM Assistants: None Pre-operative Diagnosis: R 1st MPJ ulceration with exposed tibial sesamoid bone Post-operative Diagnosis: same Procedures:             1) Sesamoidectomy R 1st MPJ. Pathology/Specimens:             1) R Tibial Sesamoid Bone Anesthesia: None Hemostasis: Anatomic Estimated Blood Loss: 3 ccs Materials: None Medications: none Complications: None  Indications for Procedure:   Procedure in Detail: The right lower extremity was thoroughly prepped with chlorhexidine scrub.  Using sterile instrumentation the wound was explored with exposed tibial sesamoid bone.  The sesamoid bone appeared gray in appearance.  The sesamoid bone was carefully excised with forceps and scissors.  This was collected for pathology. The underlying metatarsal head appeared healthy and viable and tan in appearance. A sterile compression dressing was applied.  Patient tolerated the procedure well.

## 2017-10-31 NOTE — Telephone Encounter (Addendum)
Dr. March Rummage ordered Wound vac to right 1st MPJ white foam over exposed bone cover with black foam 151mmHg continuous pressure with vac changes Monday, Wednesday and Friday by [facility], and BLE multilayer Compression wraps. I called orders for Wound Vac and compression wraps to [facility]. Baker Janus states they use [employer] wound vac, asked wound care for left foot, right 1, 2 toes and right 1st MPJ until wound vac arrived and suggested medihoney. Dr. March Rummage states medihoney to all areas until wound vac is placed. Orders for Medihoney called to [facility].

## 2017-11-01 ENCOUNTER — Other Ambulatory Visit: Payer: Self-pay | Admitting: *Deleted

## 2017-11-01 NOTE — Patient Outreach (Signed)
[name]) Care Management  11/01/2017  Johnathan Wright 06-11-1946 [id number]  Referral via [place]; patient recently discharged from inpatient admission from [facility]  10/23/2017.  Telephone call to patient who was advised of reason for referral & of [facility] care management services.   Patient voices he no longer has [employer].    Advised patient not eligible for [facility] services via [facility] coverage.   Plan: Will have other coverage checked and follow up with patient.   Sherrin Daisy, RN BSN [place] Management Coordinator [facility] Care Management  [phone]

## 2017-11-01 NOTE — Telephone Encounter (Signed)
I informed D. Farson, I had just received the 10/30/2017 clinicals and was faxing to her and Dr. March Rummage would like to have the Wound Vac placed tomorrow by [facility]. D. Ezequiel Ganser states she will work as quickly as possible.

## 2017-11-02 DIAGNOSIS — G4733 Obstructive sleep apnea (adult) (pediatric): Secondary | ICD-10-CM | POA: Diagnosis not present

## 2017-11-02 DIAGNOSIS — L97521 Non-pressure chronic ulcer of other part of left foot limited to breakdown of skin: Secondary | ICD-10-CM | POA: Diagnosis not present

## 2017-11-02 DIAGNOSIS — G910 Communicating hydrocephalus: Secondary | ICD-10-CM | POA: Diagnosis not present

## 2017-11-02 DIAGNOSIS — E11621 Type 2 diabetes mellitus with foot ulcer: Secondary | ICD-10-CM | POA: Diagnosis not present

## 2017-11-02 DIAGNOSIS — L97515 Non-pressure chronic ulcer of other part of right foot with muscle involvement without evidence of necrosis: Secondary | ICD-10-CM | POA: Diagnosis not present

## 2017-11-02 DIAGNOSIS — E1121 Type 2 diabetes mellitus with diabetic nephropathy: Secondary | ICD-10-CM | POA: Diagnosis not present

## 2017-11-02 DIAGNOSIS — I480 Paroxysmal atrial fibrillation: Secondary | ICD-10-CM | POA: Diagnosis not present

## 2017-11-02 DIAGNOSIS — E1140 Type 2 diabetes mellitus with diabetic neuropathy, unspecified: Secondary | ICD-10-CM | POA: Diagnosis not present

## 2017-11-02 DIAGNOSIS — L97514 Non-pressure chronic ulcer of other part of right foot with necrosis of bone: Secondary | ICD-10-CM | POA: Diagnosis not present

## 2017-11-02 DIAGNOSIS — I5022 Chronic systolic (congestive) heart failure: Secondary | ICD-10-CM | POA: Diagnosis not present

## 2017-11-02 DIAGNOSIS — E1142 Type 2 diabetes mellitus with diabetic polyneuropathy: Secondary | ICD-10-CM | POA: Diagnosis not present

## 2017-11-02 DIAGNOSIS — Z982 Presence of cerebrospinal fluid drainage device: Secondary | ICD-10-CM | POA: Diagnosis not present

## 2017-11-02 DIAGNOSIS — Z87891 Personal history of nicotine dependence: Secondary | ICD-10-CM | POA: Diagnosis not present

## 2017-11-02 DIAGNOSIS — G6181 Chronic inflammatory demyelinating polyneuritis: Secondary | ICD-10-CM | POA: Diagnosis not present

## 2017-11-02 NOTE — Telephone Encounter (Signed)
Faxed the op notes of 10/22/2017 to [place].

## 2017-11-02 NOTE — Telephone Encounter (Signed)
[place] states wound vac will be sent to pt's home 11/02/2017, please also fax copy of op note. I asked Dr.Price to print the op note for [facility].

## 2017-11-05 ENCOUNTER — Ambulatory Visit (INDEPENDENT_AMBULATORY_CARE_PROVIDER_SITE_OTHER): Payer: Medicare Other | Admitting: Podiatry

## 2017-11-05 DIAGNOSIS — L97514 Non-pressure chronic ulcer of other part of right foot with necrosis of bone: Secondary | ICD-10-CM

## 2017-11-05 DIAGNOSIS — L97521 Non-pressure chronic ulcer of other part of left foot limited to breakdown of skin: Secondary | ICD-10-CM

## 2017-11-06 ENCOUNTER — Telehealth: Payer: Self-pay | Admitting: *Deleted

## 2017-11-06 NOTE — Telephone Encounter (Signed)
I spoke with [name] and informed that Dr. March Rummage was concerned they did not have the proper supplies for the wound Vac and the compression multi-layer wraps. [name] states she will discuss with pt's nurse - [name].

## 2017-11-06 NOTE — Telephone Encounter (Signed)
-----   Message from Evelina Bucy, DPM sent at 11/05/2017  4:27 PM EST ----- Regarding: HHC Val,  Patient is receiving HHC from [facility]. Apparently they said they would only come once weekly and attempted to teach the patient's friend to apply the [facility] which she does not feel comfortable doing. Can we get them to come twice a week - Wednesday and Friday? I can apply his VAC on mondays.  Also they seem to be having difficulty getting appropriate supplies. They couldn't get the white foam for the [facility], and also can't get appropriate supplies for the patient's compression wraps. Patient needs a multi-level compression wrap - such as webril, ACE, Coban.  If these issues can't be resolved I think we may have to look into getting a new [place].

## 2017-11-06 NOTE — Telephone Encounter (Signed)
Unable to leave message concerning the missing white foam supplies. Faxed note to [facility] stating [facility] was not able to get the appropriate supplies ordered on 10/31/2017 and Dr. March Rummage was very concerned.

## 2017-11-06 NOTE — Telephone Encounter (Signed)
Dawn - [place] states she spoke with pt's [facility] nurse and told her the white foam is on back order, but a starter kit was sent that contains the white foam.

## 2017-11-07 ENCOUNTER — Other Ambulatory Visit: Payer: Self-pay | Admitting: *Deleted

## 2017-11-07 DIAGNOSIS — G4733 Obstructive sleep apnea (adult) (pediatric): Secondary | ICD-10-CM | POA: Diagnosis not present

## 2017-11-07 DIAGNOSIS — E1121 Type 2 diabetes mellitus with diabetic nephropathy: Secondary | ICD-10-CM | POA: Diagnosis not present

## 2017-11-07 DIAGNOSIS — Z982 Presence of cerebrospinal fluid drainage device: Secondary | ICD-10-CM | POA: Diagnosis not present

## 2017-11-07 DIAGNOSIS — I5022 Chronic systolic (congestive) heart failure: Secondary | ICD-10-CM | POA: Diagnosis not present

## 2017-11-07 DIAGNOSIS — L97515 Non-pressure chronic ulcer of other part of right foot with muscle involvement without evidence of necrosis: Secondary | ICD-10-CM | POA: Diagnosis not present

## 2017-11-07 DIAGNOSIS — E1142 Type 2 diabetes mellitus with diabetic polyneuropathy: Secondary | ICD-10-CM | POA: Diagnosis not present

## 2017-11-07 DIAGNOSIS — Z87891 Personal history of nicotine dependence: Secondary | ICD-10-CM | POA: Diagnosis not present

## 2017-11-07 DIAGNOSIS — I480 Paroxysmal atrial fibrillation: Secondary | ICD-10-CM | POA: Diagnosis not present

## 2017-11-07 DIAGNOSIS — G910 Communicating hydrocephalus: Secondary | ICD-10-CM | POA: Diagnosis not present

## 2017-11-07 DIAGNOSIS — E11621 Type 2 diabetes mellitus with foot ulcer: Secondary | ICD-10-CM | POA: Diagnosis not present

## 2017-11-07 DIAGNOSIS — G6181 Chronic inflammatory demyelinating polyneuritis: Secondary | ICD-10-CM | POA: Diagnosis not present

## 2017-11-07 NOTE — Patient Outreach (Signed)
[place] [facility]) Care Management  11/07/2017  GIORGIO CHABOT 1946-03-06 [id number]   Referral via [place]; patient recently discharged from inpatient admission from [facility]  10/23/2017.  Telephone call to patient who was advised of reason for referral & of [facility] care management services.   Patient voices he no longer has [employer]. Advised patient not eligible for [facility] services via [facility] coverage.    Will have patient's new coverage checked to see if eligible for [facility] services.   Care management assistant advised that patient is eligible for [facility] services with new coverage through [facility].  Telephone call to patient who advised that caregiver -Enid Derry takes his health care calls. HIPPA verification received from caregiver-Shirley.   Caregiver-Shirley voices that patient was recently admitted to [facility] with weakness, confusion & infection of great toe-right. States he has been under medical care for great toe problem. Voices that he is home now with home health services for wound care.   Caregiver voices that she manages patient's medications & makes sure he takes as prescribes  patient has trouble with mobility when transferring from vehicles so currently is using [facility] in [facility] to get to MD appointments.  States patient cancelled primary care appointment on 1/9 because he felt to weak to travel. States MD has been in contact with her-caregiver & home health RN regarding update on patient's condition. States patient has seen foot doctor 2 times since hospital stay & next appointment 1/22. States primary care appointment 2/3.   Caregiver offered  [facility] care management services. Caregiver states she felt comfortable taking care of patient because she is former EMT and also worked in nursing home in the past. States she is familiar with managing diabetes. States she knows when to call MD for symptoms & when to call 911.  Voices no case management needs and is declining community & telephonic services/. Has declined weekly calls for transition of care.   Plan: Send MD closure letter. Send to care management assistant for case closure.  Sherrin Daisy, RN BSN [place] Management Coordinator [facility] Care Management  [phone]

## 2017-11-07 NOTE — Telephone Encounter (Signed)
Entered in error

## 2017-11-08 ENCOUNTER — Encounter: Payer: Self-pay | Admitting: *Deleted

## 2017-11-09 ENCOUNTER — Telehealth: Payer: Self-pay | Admitting: Podiatry

## 2017-11-09 DIAGNOSIS — I5022 Chronic systolic (congestive) heart failure: Secondary | ICD-10-CM | POA: Diagnosis not present

## 2017-11-09 DIAGNOSIS — G4733 Obstructive sleep apnea (adult) (pediatric): Secondary | ICD-10-CM | POA: Diagnosis not present

## 2017-11-09 DIAGNOSIS — G910 Communicating hydrocephalus: Secondary | ICD-10-CM | POA: Diagnosis not present

## 2017-11-09 DIAGNOSIS — E11621 Type 2 diabetes mellitus with foot ulcer: Secondary | ICD-10-CM | POA: Diagnosis not present

## 2017-11-09 DIAGNOSIS — L97515 Non-pressure chronic ulcer of other part of right foot with muscle involvement without evidence of necrosis: Secondary | ICD-10-CM | POA: Diagnosis not present

## 2017-11-09 DIAGNOSIS — E1142 Type 2 diabetes mellitus with diabetic polyneuropathy: Secondary | ICD-10-CM | POA: Diagnosis not present

## 2017-11-09 DIAGNOSIS — I480 Paroxysmal atrial fibrillation: Secondary | ICD-10-CM | POA: Diagnosis not present

## 2017-11-09 DIAGNOSIS — Z87891 Personal history of nicotine dependence: Secondary | ICD-10-CM | POA: Diagnosis not present

## 2017-11-09 DIAGNOSIS — E1121 Type 2 diabetes mellitus with diabetic nephropathy: Secondary | ICD-10-CM | POA: Diagnosis not present

## 2017-11-09 DIAGNOSIS — Z982 Presence of cerebrospinal fluid drainage device: Secondary | ICD-10-CM | POA: Diagnosis not present

## 2017-11-09 DIAGNOSIS — G6181 Chronic inflammatory demyelinating polyneuritis: Secondary | ICD-10-CM | POA: Diagnosis not present

## 2017-11-09 MED ORDER — CIPROFLOXACIN HCL 500 MG PO TABS: 500.0000 mg | ORAL_TABLET | Freq: Two times a day (BID) | ORAL | 0 refills | Status: DC

## 2017-11-09 MED ORDER — CLINDAMYCIN HCL 300 MG PO CAPS: 300.0000 mg | ORAL_CAPSULE | Freq: Three times a day (TID) | ORAL | 0 refills | Status: DC

## 2017-11-09 NOTE — Telephone Encounter (Signed)
Left message informing pt's caregiver Enid Derry, Dr March Rummage had ordered the refills of the antibiotics.

## 2017-11-09 NOTE — Telephone Encounter (Signed)
Dr. Price please advise 

## 2017-11-09 NOTE — Telephone Encounter (Signed)
This is Johnathan Wright calling in reference to [name]. Dr. March Rummage has stated that wanted him to continue on his antibiotics but he hasn't called the order in yet. Could he get that taken care of because he is out of the Cipro now and only has a couple of the blue pills. Thank you. Bye bye. Oh sorry, phone number is [phone]. Thank you. Bye bye.

## 2017-11-09 NOTE — Telephone Encounter (Signed)
Hey this is [name] with [facility]. I was calling to let you know we got the multilayer compression system for his leg. I wanted to see if you wanted it on the left leg also or just the right leg with the wound vac. They also got [place] white foam which was on back order but the representative did something approved different or whatever so we got that. Medicare and Medicaid have to have [place] attempt to teach the caregiver and I've never had a caregiver want to learn it. I will be seeing him two times a week. The only other thing is the caregiver told me he was coming to see you guys Tuesday instead of Monday. So would you like me to send someone on Monday and then again on Friday? So give me a call and let me know. The number is [phone]. Thank you.

## 2017-11-09 NOTE — Telephone Encounter (Signed)
I informed Johnathan Wright [facility], B/L  Multilevel compression wraps 3 times a week, next week wound vac Sunday 11/11/2017, Friday 11/16/2017, then back to Monday 11/19/2017 in-office, and [facility] Wednesday and Friday.

## 2017-11-11 DIAGNOSIS — Z87891 Personal history of nicotine dependence: Secondary | ICD-10-CM | POA: Diagnosis not present

## 2017-11-11 DIAGNOSIS — E1142 Type 2 diabetes mellitus with diabetic polyneuropathy: Secondary | ICD-10-CM | POA: Diagnosis not present

## 2017-11-11 DIAGNOSIS — I480 Paroxysmal atrial fibrillation: Secondary | ICD-10-CM | POA: Diagnosis not present

## 2017-11-11 DIAGNOSIS — Z982 Presence of cerebrospinal fluid drainage device: Secondary | ICD-10-CM | POA: Diagnosis not present

## 2017-11-11 DIAGNOSIS — G6181 Chronic inflammatory demyelinating polyneuritis: Secondary | ICD-10-CM | POA: Diagnosis not present

## 2017-11-11 DIAGNOSIS — G910 Communicating hydrocephalus: Secondary | ICD-10-CM | POA: Diagnosis not present

## 2017-11-11 DIAGNOSIS — G4733 Obstructive sleep apnea (adult) (pediatric): Secondary | ICD-10-CM | POA: Diagnosis not present

## 2017-11-11 DIAGNOSIS — I5022 Chronic systolic (congestive) heart failure: Secondary | ICD-10-CM | POA: Diagnosis not present

## 2017-11-11 DIAGNOSIS — L97515 Non-pressure chronic ulcer of other part of right foot with muscle involvement without evidence of necrosis: Secondary | ICD-10-CM | POA: Diagnosis not present

## 2017-11-11 DIAGNOSIS — E11621 Type 2 diabetes mellitus with foot ulcer: Secondary | ICD-10-CM | POA: Diagnosis not present

## 2017-11-11 DIAGNOSIS — E1121 Type 2 diabetes mellitus with diabetic nephropathy: Secondary | ICD-10-CM | POA: Diagnosis not present

## 2017-11-13 ENCOUNTER — Ambulatory Visit (INDEPENDENT_AMBULATORY_CARE_PROVIDER_SITE_OTHER): Payer: Medicare Other | Admitting: Podiatry

## 2017-11-13 ENCOUNTER — Ambulatory Visit: Payer: Medicare Other | Admitting: Podiatry

## 2017-11-13 DIAGNOSIS — L97511 Non-pressure chronic ulcer of other part of right foot limited to breakdown of skin: Secondary | ICD-10-CM

## 2017-11-13 DIAGNOSIS — R239 Unspecified skin changes: Secondary | ICD-10-CM

## 2017-11-13 DIAGNOSIS — M2041 Other hammer toe(s) (acquired), right foot: Secondary | ICD-10-CM

## 2017-11-13 DIAGNOSIS — M86671 Other chronic osteomyelitis, right ankle and foot: Secondary | ICD-10-CM

## 2017-11-13 DIAGNOSIS — L988 Other specified disorders of the skin and subcutaneous tissue: Secondary | ICD-10-CM

## 2017-11-13 DIAGNOSIS — L97514 Non-pressure chronic ulcer of other part of right foot with necrosis of bone: Secondary | ICD-10-CM

## 2017-11-13 NOTE — Progress Notes (Signed)
  Subjective:  Patient ID: Johnathan Wright, male    DOB: [birth year],  MRN: [id number]  No chief complaint on file.  72 y.o. male returns for wound care. Believes the wound to be about the same.  Has had home health coming and applying the wound VAC. Denies N/V/F/Ch.  Objective:  There were no vitals filed for this visit. General AA&O x3. Normal mood and affect.  Vascular Foot warm to touch.  Capillary refill slightly delayed.  Neurologic Sensation grossly diminished.  Dermatologic (Wound) Right first MPJ ulceration approximately 3 x 4 with exposed bone of the first metatarsal head.  Severe maceration noted interdigitally into the dorsum of the foot.  Pressure sore noted to the medial aspect of the second toe  Right hallux ulceration plantar IPJ pinpoint Second toe ulceration measuring 0.5x05 with granular base  Orthopedic: Valgus deformity noted to the right great toe with overlapping hallux deformity relative to the second toe   Assessment & Plan:  Patient was evaluated and treated and all questions answered.  Right first MPJ ulceration, deep to bone. -Worsening of the wound noted today. -It appears the wound VAC has been applied in such a fashion that patient has severe maceration to the foot dorsally and interdigitally; additionally patient has evidence of the tubing being wrapped under compression to the skin with a tract for the tubing and early pressure points at the dorsum of the foot.  Due to the maceration will avoid wound VAC today the foot was dressed with Betadine wet-to-dry dressing today. -Lengthy conversation was had with patient about treatment course at this time.  Patient has a large wound to the first metatarsal right foot with new exposed bone medially.  Additionally he now has lateral drifting of the hallux with early pressure sore of the right second toe due to rubbing.  Discussed with patient that even if the wound heals uneventfully which is of course not guaranteed,  the position of the hallux is troublesome due to causing ulceration of the second toe.  For this it was discussed the patient that he would best benefit from resection of the first metatarsal of all infected bone with amputation of the right great toe.  Would plan for filleted toe flap to cover the remaining deficit.  Advised that the success of this procedure is dependent upon the ability of the great toe to heal.  Ordered non-invasive vascular studies with toe brachial index to assess adequacy of healing prior to procedure.  Advised patient would also have to reduce weightbearing postoperatively for best wound  healing chance.  Patient understands plan and wishes to proceed.  We will plan for procedure next week.  Right second toe DIPJ hammertoe with ulcer -We will plan for concomitant right second toe flexor tenotomy  30 minutes of face to face time were spent with the patient. >50% of this was spent on counseling and coordination of care. Specifically discussed with patient the above diagnosis and treatment plans..  Follow-up next week for wound check and to discuss vascular studies.

## 2017-11-14 ENCOUNTER — Telehealth: Payer: Self-pay | Admitting: *Deleted

## 2017-11-14 NOTE — Telephone Encounter (Deleted)
-----   Message from Evelina Bucy, DPM sent at 11/13/2017  5:26 PM EST ----- Regarding: Vascular studies. Can we order non-invasive vascular studies (segmental doppler ultrasound) for this patient at [facility]? I really really really need them to include TBI (toe-brachial index) please

## 2017-11-15 ENCOUNTER — Telehealth: Payer: Self-pay | Admitting: Podiatry

## 2017-11-15 DIAGNOSIS — I482 Chronic atrial fibrillation: Secondary | ICD-10-CM | POA: Diagnosis not present

## 2017-11-15 DIAGNOSIS — I10 Essential (primary) hypertension: Secondary | ICD-10-CM | POA: Diagnosis not present

## 2017-11-15 DIAGNOSIS — E1121 Type 2 diabetes mellitus with diabetic nephropathy: Secondary | ICD-10-CM | POA: Diagnosis not present

## 2017-11-15 DIAGNOSIS — E782 Mixed hyperlipidemia: Secondary | ICD-10-CM | POA: Diagnosis not present

## 2017-11-15 DIAGNOSIS — Z01810 Encounter for preprocedural cardiovascular examination: Secondary | ICD-10-CM | POA: Diagnosis not present

## 2017-11-15 DIAGNOSIS — Z01818 Encounter for other preprocedural examination: Secondary | ICD-10-CM | POA: Diagnosis not present

## 2017-11-15 NOTE — Telephone Encounter (Signed)
I informed pt of the appt 11/19/2017 [facility]. Faxed orders to [place].

## 2017-11-15 NOTE — Telephone Encounter (Signed)
Johnathan Wright Imaging scheduled pt for 11/19/2017 to arrive at 10:00am for 10:30am testing.

## 2017-11-15 NOTE — Telephone Encounter (Signed)
This is Johnathan Wright with [facility]. I was calling to see if Dr. March Rummage wanted the wound vac back on? His caregiver said that you all left it off Tuesday and that he is going to have surgery. So if you didn't call [employer] you need to because they get charged every day for that. So if you would give me a ring back and let me know what to do. [phone]. And also, if you just want a certain kind of wound care do the medihoney like the others or whatever. Thanks a lot. Bye.

## 2017-11-15 NOTE — Telephone Encounter (Signed)
-----   Message from Evelina Bucy, DPM sent at 11/13/2017  5:26 PM EST ----- Regarding: Vascular studies. Can we order non-invasive vascular studies (segmental doppler ultrasound) for this patient at [facility]? I really really really need them to include TBI (toe-brachial index) please

## 2017-11-16 ENCOUNTER — Telehealth: Payer: Self-pay | Admitting: *Deleted

## 2017-11-16 DIAGNOSIS — G4733 Obstructive sleep apnea (adult) (pediatric): Secondary | ICD-10-CM | POA: Diagnosis not present

## 2017-11-16 DIAGNOSIS — E11621 Type 2 diabetes mellitus with foot ulcer: Secondary | ICD-10-CM | POA: Diagnosis not present

## 2017-11-16 DIAGNOSIS — L97515 Non-pressure chronic ulcer of other part of right foot with muscle involvement without evidence of necrosis: Secondary | ICD-10-CM | POA: Diagnosis not present

## 2017-11-16 DIAGNOSIS — Z982 Presence of cerebrospinal fluid drainage device: Secondary | ICD-10-CM | POA: Diagnosis not present

## 2017-11-16 DIAGNOSIS — G910 Communicating hydrocephalus: Secondary | ICD-10-CM | POA: Diagnosis not present

## 2017-11-16 DIAGNOSIS — Z87891 Personal history of nicotine dependence: Secondary | ICD-10-CM | POA: Diagnosis not present

## 2017-11-16 DIAGNOSIS — I480 Paroxysmal atrial fibrillation: Secondary | ICD-10-CM | POA: Diagnosis not present

## 2017-11-16 DIAGNOSIS — E1142 Type 2 diabetes mellitus with diabetic polyneuropathy: Secondary | ICD-10-CM | POA: Diagnosis not present

## 2017-11-16 DIAGNOSIS — E1121 Type 2 diabetes mellitus with diabetic nephropathy: Secondary | ICD-10-CM | POA: Diagnosis not present

## 2017-11-16 DIAGNOSIS — G6181 Chronic inflammatory demyelinating polyneuritis: Secondary | ICD-10-CM | POA: Diagnosis not present

## 2017-11-16 DIAGNOSIS — I5022 Chronic systolic (congestive) heart failure: Secondary | ICD-10-CM | POA: Diagnosis not present

## 2017-11-16 NOTE — Telephone Encounter (Addendum)
"  We're working on surgical clearance for you, for [name].  We wanted to see if the plans for his surgery is for general anesthesia or if he will receive a block.  Could you give me a call?

## 2017-11-19 ENCOUNTER — Ambulatory Visit (INDEPENDENT_AMBULATORY_CARE_PROVIDER_SITE_OTHER): Payer: Medicare Other | Admitting: Podiatry

## 2017-11-19 DIAGNOSIS — M2041 Other hammer toe(s) (acquired), right foot: Secondary | ICD-10-CM

## 2017-11-19 DIAGNOSIS — I739 Peripheral vascular disease, unspecified: Secondary | ICD-10-CM

## 2017-11-19 DIAGNOSIS — L97519 Non-pressure chronic ulcer of other part of right foot with unspecified severity: Secondary | ICD-10-CM | POA: Diagnosis not present

## 2017-11-19 DIAGNOSIS — M86671 Other chronic osteomyelitis, right ankle and foot: Secondary | ICD-10-CM | POA: Diagnosis not present

## 2017-11-19 DIAGNOSIS — L97514 Non-pressure chronic ulcer of other part of right foot with necrosis of bone: Secondary | ICD-10-CM

## 2017-11-19 NOTE — Telephone Encounter (Signed)
I left a message for Hoyle Sauer that Sanel Dunlevy's surgery will be done under a local/IV sedation.  A general block will not be used.  I asked her to please fax the completed form to me at [phone] and my direct phone number is [phone] if they have further questions.

## 2017-11-19 NOTE — Progress Notes (Signed)
  Subjective:  Patient ID: TOA MIA, male    DOB: 1945-10-31,  MRN: [id number]  Chief Complaint  Patient presents with  . Foot Ulcer    fu ulcer right 1st discuss sx tomorrow    72 y.o. male returns for wound care. Had ABIs performed and here for review.  Objective:  There were no vitals filed for this visit. General AA&O x3. Normal mood and affect.  Vascular Foot warm to touch.  Capillary refill slightly delayed.  Neurologic Sensation grossly diminished.  Dermatologic (Wound) Right first MPJ ulceration with granulation tissue and no exposed bone.  Right hallux ulceration plantar IPJ pinpoint Second toe ulceration measuring 0.5x05 with granular base  Orthopedic: Valgus deformity noted to the right great toe with overlapping hallux deformity relative to the second toe   Assessment & Plan:  Patient was evaluated and treated and all questions answered.  Right first MPJ ulceration, deep to bone. -Will proceed with planned surgery tomorrow. -Vascular studies reviewed with patient. Suggestive of adequate healing potential. -Advised to bring his wound VAC supplies tomorrow to surgery. -Discussed that postoperatively he would benefit from short inpatient stay in the hospital with discharge to rehab facility.  Patient amenable.  20 minutes of face to face time were spent with the patient. >50% of this was spent on counseling and coordination of care. Specifically discussed with patient the above diagnosis and treatment plan.

## 2017-11-20 ENCOUNTER — Encounter: Payer: Self-pay | Admitting: Podiatry

## 2017-11-20 DIAGNOSIS — Z79899 Other long term (current) drug therapy: Secondary | ICD-10-CM | POA: Diagnosis not present

## 2017-11-20 DIAGNOSIS — F319 Bipolar disorder, unspecified: Secondary | ICD-10-CM | POA: Diagnosis not present

## 2017-11-20 DIAGNOSIS — I10 Essential (primary) hypertension: Secondary | ICD-10-CM | POA: Diagnosis not present

## 2017-11-20 DIAGNOSIS — G8911 Acute pain due to trauma: Secondary | ICD-10-CM | POA: Diagnosis not present

## 2017-11-20 DIAGNOSIS — E1151 Type 2 diabetes mellitus with diabetic peripheral angiopathy without gangrene: Secondary | ICD-10-CM | POA: Diagnosis not present

## 2017-11-20 DIAGNOSIS — M6281 Muscle weakness (generalized): Secondary | ICD-10-CM | POA: Diagnosis not present

## 2017-11-20 DIAGNOSIS — Z452 Encounter for adjustment and management of vascular access device: Secondary | ICD-10-CM | POA: Diagnosis not present

## 2017-11-20 DIAGNOSIS — G894 Chronic pain syndrome: Secondary | ICD-10-CM | POA: Diagnosis not present

## 2017-11-20 DIAGNOSIS — E119 Type 2 diabetes mellitus without complications: Secondary | ICD-10-CM | POA: Diagnosis not present

## 2017-11-20 DIAGNOSIS — M86071 Acute hematogenous osteomyelitis, right ankle and foot: Secondary | ICD-10-CM | POA: Diagnosis not present

## 2017-11-20 DIAGNOSIS — E1165 Type 2 diabetes mellitus with hyperglycemia: Secondary | ICD-10-CM | POA: Diagnosis not present

## 2017-11-20 DIAGNOSIS — B9562 Methicillin resistant Staphylococcus aureus infection as the cause of diseases classified elsewhere: Secondary | ICD-10-CM | POA: Diagnosis not present

## 2017-11-20 DIAGNOSIS — E1169 Type 2 diabetes mellitus with other specified complication: Secondary | ICD-10-CM | POA: Diagnosis not present

## 2017-11-20 DIAGNOSIS — Z794 Long term (current) use of insulin: Secondary | ICD-10-CM | POA: Diagnosis not present

## 2017-11-20 DIAGNOSIS — I110 Hypertensive heart disease with heart failure: Secondary | ICD-10-CM | POA: Diagnosis not present

## 2017-11-20 DIAGNOSIS — G8929 Other chronic pain: Secondary | ICD-10-CM | POA: Diagnosis not present

## 2017-11-20 DIAGNOSIS — M79671 Pain in right foot: Secondary | ICD-10-CM | POA: Diagnosis not present

## 2017-11-20 DIAGNOSIS — M86671 Other chronic osteomyelitis, right ankle and foot: Secondary | ICD-10-CM

## 2017-11-20 DIAGNOSIS — Z8711 Personal history of peptic ulcer disease: Secondary | ICD-10-CM | POA: Diagnosis not present

## 2017-11-20 DIAGNOSIS — Z87891 Personal history of nicotine dependence: Secondary | ICD-10-CM | POA: Diagnosis not present

## 2017-11-20 DIAGNOSIS — I509 Heart failure, unspecified: Secondary | ICD-10-CM | POA: Diagnosis not present

## 2017-11-20 DIAGNOSIS — E1351 Other specified diabetes mellitus with diabetic peripheral angiopathy without gangrene: Secondary | ICD-10-CM | POA: Diagnosis not present

## 2017-11-20 DIAGNOSIS — K5909 Other constipation: Secondary | ICD-10-CM | POA: Diagnosis not present

## 2017-11-20 DIAGNOSIS — L97514 Non-pressure chronic ulcer of other part of right foot with necrosis of bone: Secondary | ICD-10-CM | POA: Diagnosis not present

## 2017-11-20 DIAGNOSIS — R609 Edema, unspecified: Secondary | ICD-10-CM | POA: Diagnosis not present

## 2017-11-20 DIAGNOSIS — R262 Difficulty in walking, not elsewhere classified: Secondary | ICD-10-CM | POA: Diagnosis not present

## 2017-11-20 DIAGNOSIS — G6181 Chronic inflammatory demyelinating polyneuritis: Secondary | ICD-10-CM | POA: Diagnosis not present

## 2017-11-20 DIAGNOSIS — M869 Osteomyelitis, unspecified: Secondary | ICD-10-CM | POA: Diagnosis not present

## 2017-11-21 ENCOUNTER — Encounter: Payer: Self-pay | Admitting: Podiatry

## 2017-11-22 NOTE — Progress Notes (Signed)
  Subjective:  Patient ID: Johnathan Wright, male    DOB: 08-30-1946,  MRN: [id number]  Chief Complaint  Patient presents with  . Foot Ulcer    ulcer check  wound vac placement    72 y.o. male returns for wound care. Did not bring wound VAC supplies today. Denies N/V/F/Ch.  Objective:  There were no vitals filed for this visit. General AA&O x3. Normal mood and affect.  Vascular Foot warm to touch.  Neurologic Sensation grossly diminished.  Dermatologic (Wound) Wound Location: Rt. foot first MPJ Wound Measurement: 4 x 4 by 0.5 with exposed bone Wound Base: Mixed Granular/Fibrotic Peri-wound: Macerated Exudate: Scant/small amount Serosanguinous exudate  Wound progress: No Change since last check.  Orthopedic: No pain to palpation either foot.   Assessment & Plan:  Patient was evaluated and treated and all questions answered.  Ulcer right first MPJ -No debridement today -Dressed with medihoney, DSD. -No wound VAC supplies today to apply wound VAC. 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  Return in about 1 week (around 11/12/2017) for ulcer check and wound vac change **30 min slot**.

## 2017-11-23 DIAGNOSIS — E1351 Other specified diabetes mellitus with diabetic peripheral angiopathy without gangrene: Secondary | ICD-10-CM | POA: Diagnosis not present

## 2017-11-23 DIAGNOSIS — E1165 Type 2 diabetes mellitus with hyperglycemia: Secondary | ICD-10-CM | POA: Diagnosis not present

## 2017-11-23 DIAGNOSIS — R609 Edema, unspecified: Secondary | ICD-10-CM | POA: Diagnosis not present

## 2017-11-23 DIAGNOSIS — M869 Osteomyelitis, unspecified: Secondary | ICD-10-CM | POA: Diagnosis not present

## 2017-11-24 DIAGNOSIS — M869 Osteomyelitis, unspecified: Secondary | ICD-10-CM | POA: Diagnosis not present

## 2017-11-24 DIAGNOSIS — R609 Edema, unspecified: Secondary | ICD-10-CM | POA: Diagnosis not present

## 2017-11-24 DIAGNOSIS — E1165 Type 2 diabetes mellitus with hyperglycemia: Secondary | ICD-10-CM | POA: Diagnosis not present

## 2017-11-24 DIAGNOSIS — E1351 Other specified diabetes mellitus with diabetic peripheral angiopathy without gangrene: Secondary | ICD-10-CM | POA: Diagnosis not present

## 2017-11-25 DIAGNOSIS — M869 Osteomyelitis, unspecified: Secondary | ICD-10-CM | POA: Diagnosis not present

## 2017-11-25 DIAGNOSIS — R609 Edema, unspecified: Secondary | ICD-10-CM | POA: Diagnosis not present

## 2017-11-25 DIAGNOSIS — E1351 Other specified diabetes mellitus with diabetic peripheral angiopathy without gangrene: Secondary | ICD-10-CM | POA: Diagnosis not present

## 2017-11-25 DIAGNOSIS — E1165 Type 2 diabetes mellitus with hyperglycemia: Secondary | ICD-10-CM | POA: Diagnosis not present

## 2017-11-26 DIAGNOSIS — D333 Benign neoplasm of cranial nerves: Secondary | ICD-10-CM | POA: Diagnosis not present

## 2017-11-26 DIAGNOSIS — R262 Difficulty in walking, not elsewhere classified: Secondary | ICD-10-CM | POA: Diagnosis not present

## 2017-11-26 DIAGNOSIS — G894 Chronic pain syndrome: Secondary | ICD-10-CM | POA: Diagnosis not present

## 2017-11-26 DIAGNOSIS — M86071 Acute hematogenous osteomyelitis, right ankle and foot: Secondary | ICD-10-CM | POA: Diagnosis not present

## 2017-11-26 DIAGNOSIS — G6181 Chronic inflammatory demyelinating polyneuritis: Secondary | ICD-10-CM | POA: Diagnosis not present

## 2017-11-26 DIAGNOSIS — G8911 Acute pain due to trauma: Secondary | ICD-10-CM | POA: Diagnosis not present

## 2017-11-26 DIAGNOSIS — L928 Other granulomatous disorders of the skin and subcutaneous tissue: Secondary | ICD-10-CM | POA: Diagnosis not present

## 2017-11-26 DIAGNOSIS — L97511 Non-pressure chronic ulcer of other part of right foot limited to breakdown of skin: Secondary | ICD-10-CM | POA: Diagnosis not present

## 2017-11-26 DIAGNOSIS — I10 Essential (primary) hypertension: Secondary | ICD-10-CM | POA: Diagnosis not present

## 2017-11-26 DIAGNOSIS — M6281 Muscle weakness (generalized): Secondary | ICD-10-CM | POA: Diagnosis not present

## 2017-11-26 DIAGNOSIS — R609 Edema, unspecified: Secondary | ICD-10-CM | POA: Diagnosis not present

## 2017-11-26 DIAGNOSIS — E08621 Diabetes mellitus due to underlying condition with foot ulcer: Secondary | ICD-10-CM | POA: Diagnosis not present

## 2017-11-26 DIAGNOSIS — E1165 Type 2 diabetes mellitus with hyperglycemia: Secondary | ICD-10-CM | POA: Diagnosis not present

## 2017-11-26 DIAGNOSIS — L97514 Non-pressure chronic ulcer of other part of right foot with necrosis of bone: Secondary | ICD-10-CM | POA: Diagnosis not present

## 2017-11-26 DIAGNOSIS — F319 Bipolar disorder, unspecified: Secondary | ICD-10-CM | POA: Diagnosis not present

## 2017-11-26 DIAGNOSIS — M869 Osteomyelitis, unspecified: Secondary | ICD-10-CM | POA: Diagnosis not present

## 2017-11-26 DIAGNOSIS — M2041 Other hammer toe(s) (acquired), right foot: Secondary | ICD-10-CM | POA: Diagnosis not present

## 2017-11-26 DIAGNOSIS — E119 Type 2 diabetes mellitus without complications: Secondary | ICD-10-CM | POA: Diagnosis not present

## 2017-11-26 DIAGNOSIS — G8929 Other chronic pain: Secondary | ICD-10-CM | POA: Diagnosis not present

## 2017-11-26 DIAGNOSIS — E1351 Other specified diabetes mellitus with diabetic peripheral angiopathy without gangrene: Secondary | ICD-10-CM | POA: Diagnosis not present

## 2017-11-27 ENCOUNTER — Encounter: Payer: Medicare Other | Admitting: Podiatry

## 2017-11-27 DIAGNOSIS — M869 Osteomyelitis, unspecified: Secondary | ICD-10-CM | POA: Diagnosis not present

## 2017-11-27 DIAGNOSIS — G8929 Other chronic pain: Secondary | ICD-10-CM | POA: Diagnosis not present

## 2017-11-27 DIAGNOSIS — I10 Essential (primary) hypertension: Secondary | ICD-10-CM | POA: Diagnosis not present

## 2017-11-27 DIAGNOSIS — E119 Type 2 diabetes mellitus without complications: Secondary | ICD-10-CM | POA: Diagnosis not present

## 2017-11-29 ENCOUNTER — Telehealth: Payer: Self-pay | Admitting: *Deleted

## 2017-11-29 NOTE — Telephone Encounter (Signed)
Patients friend called and stated that the facility [facility]) is not using the wound vac and that she was to call us for orders to discontinue and return.

## 2017-12-04 ENCOUNTER — Ambulatory Visit (INDEPENDENT_AMBULATORY_CARE_PROVIDER_SITE_OTHER): Payer: Medicare Other | Admitting: Podiatry

## 2017-12-04 DIAGNOSIS — Z9889 Other specified postprocedural states: Secondary | ICD-10-CM

## 2017-12-05 ENCOUNTER — Telehealth: Payer: Self-pay | Admitting: *Deleted

## 2017-12-05 NOTE — Telephone Encounter (Signed)
I spoke with Johnathan Wright, she states [name] was calling for clarification of multilayer leg wraps, they have them there and will be applying to pt.

## 2017-12-05 NOTE — Telephone Encounter (Signed)
A. Horton - Scheduler states message was left on scheduler's voicemail, [facility] needs clarification of orders.

## 2017-12-11 ENCOUNTER — Ambulatory Visit (INDEPENDENT_AMBULATORY_CARE_PROVIDER_SITE_OTHER): Payer: Medicare HMO | Admitting: Podiatry

## 2017-12-11 DIAGNOSIS — L97514 Non-pressure chronic ulcer of other part of right foot with necrosis of bone: Secondary | ICD-10-CM

## 2017-12-11 DIAGNOSIS — M2041 Other hammer toe(s) (acquired), right foot: Secondary | ICD-10-CM

## 2017-12-12 NOTE — Progress Notes (Signed)
  Subjective:  Patient ID: Johnathan Wright, male    DOB: Aug 09, 1946,  MRN: [id number]  Chief Complaint  Patient presents with  . Routine Post Op     pov#1 dos 01.29.2019 Hallux Metatarsal Resection Rt; Hallux Filleted Toe Flap Rt    DOS: 11/20/17 Procedure: Right first metatarsal resection with filleted toe flap.  72 y.o. male returns for post-op check. Denies N/V/F/Ch.  Denies pain.  Is at [facility] rehab facility where they are performing dressing changes for him.  Objective:   General AA&O x3. Normal mood and affect.  Vascular Foot warm and well perfused.  Neurologic Gross sensation intact.  Dermatologic Skin healing well without signs of infection.  Some evidence of staple failure at the proximal aspect of the incision; flap appears viable with good capillary refill.  No warmth or erythema.  No purulence.  Nose is any sialitis.  No signs of acute infection  Orthopedic: No tenderness to palpation noted about the surgical site.   Assessment & Plan:  Patient was evaluated and treated and all questions answered.  S/p Right first metatarsal resection with filleted toe flap. -Progressing as expected post-operatively. -Sutures: Left intact. -Medications refilled: None -Foot redressed.  No Follow-up on file.

## 2017-12-12 NOTE — Progress Notes (Signed)
  Subjective:  Patient ID: Johnathan Wright, male    DOB: 25-Jan-1946,  MRN: [id number]  Chief Complaint  Patient presents with  . Routine Post Op     pov#2 dos 01.29.2019 Amp right 1st toe with flap  doing well     DOS: 11/20/17 Procedure: R partial first ray amputation with fillleted hallux flap  72 y.o. male returns for post-op check. Denies N/V/F/Ch.  Denies pain.  Has been at [facility] rehab where they have been changing his dressings daily  Objective:   General AA&O x3. Normal mood and affect.  Vascular Foot warm and well perfused.  Neurologic Gross sensation intact.  Dermatologic Skin healing well without signs of infection.  Suture failure proximally with fibroglandular bone filling in.  Skin healing well distally.  No ascending sialitis.  No warmth or erythema.  No purulence.    R 2nd flexible hammertoe with distal tip ulceration measuring 0.2 x 0.2 with fibro-granular base  Orthopedic: No tenderness to palpation noted about the surgical site.    Assessment & Plan:  Patient was evaluated and treated and all questions answered.  S/p R partial first ray amputation with fillleted hallux flap -Progressing as expected post-operatively. -Sutures: All staples removed today.  Retention sutures left intact. -Wound debrided with a tissue nipper.  Patient covered under global -Medications refilled: None -Foot redressed.  Dressed with medihoney and DSD -Continue antibiotics to completion  R 2nd Hammertoe with distal tip ulceration -Flexor tenotomy performed as below  Procedure: Flexor Tenotomy Indication for Procedure: toe with semi-reducible hammertoe with distal tip ulceration. Flexor tenotomy indicated to alleviate contracture, reduce pressure, and enhance healing of the ulceration. Location: right, 2nd toe Anesthesia: Lidocaine 1% plain; 1.5 mL and Marcaine 0.5% plain; 1.5 mL digital block Instrumentation: 18 g needle  Technique: The toe was anesthetized as above and  prepped in the usual fashion. The toe was exsanquinated and a tourniquet was secured at the base of the toe. An 18g needle was then used to percutaneously release the flexor tendon at the plantar surface of the toe with noted release of the hammertoe deformity. The incision was then dressed with antibiotic ointment and band-aid. Compression splint dressing applied. Patient tolerated the procedure well. Dressing: Dry, sterile, compression dressing. Disposition: Patient tolerated procedure well. Patient to return in 1 week for follow-up.   Follow-up in 1 week for recheck

## 2017-12-18 ENCOUNTER — Ambulatory Visit (INDEPENDENT_AMBULATORY_CARE_PROVIDER_SITE_OTHER): Payer: Medicare HMO | Admitting: Podiatry

## 2017-12-18 DIAGNOSIS — Z9889 Other specified postprocedural states: Secondary | ICD-10-CM

## 2017-12-18 DIAGNOSIS — Z89411 Acquired absence of right great toe: Secondary | ICD-10-CM

## 2017-12-18 DIAGNOSIS — L97514 Non-pressure chronic ulcer of other part of right foot with necrosis of bone: Secondary | ICD-10-CM

## 2017-12-18 DIAGNOSIS — M2041 Other hammer toe(s) (acquired), right foot: Secondary | ICD-10-CM

## 2017-12-18 DIAGNOSIS — IMO0002 Reserved for concepts with insufficient information to code with codable children: Secondary | ICD-10-CM

## 2017-12-18 NOTE — Progress Notes (Signed)
  Subjective:  Patient ID: Johnathan Wright, male    DOB: Aug 09, 1946,  MRN: [id number]  No chief complaint on file.   DOS: 11/20/17 Procedure: R partial first ray amputation with fillleted hallux flap  72 y.o. male returns for post-op check. Denies N/V/F/Ch.  No pain. At [facility]. States they ran out of medihoney and need new WC orders.  Objective:   General AA&O x3. Normal mood and affect.  Vascular Foot warm and well perfused.  Neurologic Gross sensation intact.  Dermatologic Skin healing well without signs of infection. Skin continues to heal distally. Proximally wound granulating in nicely with some fibrotic/tendinous strands present. R 2nd toe healing well. Rectus. Continued distal callus. L 1st MPJ ulceration healed.  Orthopedic: No tenderness to palpation noted about the surgical site.   Assessment & Plan:  Patient was evaluated and treated and all questions answered.  S/p R partial first ray amputation with fillleted hallux flap -Progressing well. Wound with continued fibrosis proximally. Will switch to Santyl qD to promote breakdown of fibrosis/tendinous strands. -Will send updated orders to [facility]. -Debridement today of proximal wound with tissue nipper. Covered under global. -Dressed with medihoney and DSD.  S/p R 2nd toe Flexor Tenotomy -Toe appears straight. Reduced callus formation compared to prior. -Debrided today. Covered under global. -Dressed with medihoney and DSD.   Follow-up in 1 week for recheck

## 2017-12-20 ENCOUNTER — Telehealth: Payer: Self-pay | Admitting: *Deleted

## 2017-12-20 MED ORDER — COLLAGENASE 250 UNIT/GM EX OINT: 1.0000 "application " | TOPICAL_OINTMENT | Freq: Every day | CUTANEOUS | 5 refills | Status: DC

## 2017-12-20 NOTE — Telephone Encounter (Signed)
Faxed required form, clinicals and demographics to [facility].

## 2017-12-20 NOTE — Telephone Encounter (Signed)
[facility] states may fax pt's orders to [phone] Attn: Station 1. Orders faxed to [facility].

## 2017-12-20 NOTE — Telephone Encounter (Signed)
-----   Message from Evelina Bucy, DPM sent at 12/18/2017  2:16 PM EST ----- Regarding: Orders for wound care Can we send updated orders to [facility] rehab for this patient?  Santyl WTD Daily to all open wounds of R Foot. See completed note for further details. Not sure if we need to also send Santyl Rx.  Thanks :)

## 2017-12-25 ENCOUNTER — Ambulatory Visit (INDEPENDENT_AMBULATORY_CARE_PROVIDER_SITE_OTHER): Payer: Medicare HMO | Admitting: Podiatry

## 2017-12-25 DIAGNOSIS — L97514 Non-pressure chronic ulcer of other part of right foot with necrosis of bone: Secondary | ICD-10-CM

## 2017-12-27 ENCOUNTER — Telehealth: Payer: Self-pay | Admitting: Podiatry

## 2017-12-27 NOTE — Telephone Encounter (Signed)
Faxed requested clinicals to [facility].

## 2017-12-27 NOTE — Telephone Encounter (Signed)
Hi, this is Scientist, research (physical sciences) from [employer]. I'm calling in regards to [name]. I am working on re-authorization of wound vac and I need to get some updated measurements and wound assessments. I actually have one that is past due then one that is due as well. The first one I'm needing is between 27 January - 11 February and the second one I'm needing is on or after 26 February. If you would please give me a call back I can be reached directly at [phone] or the clinical notes can be faxed to [phone]. Thank you so much and have a great day.

## 2018-01-01 ENCOUNTER — Ambulatory Visit (INDEPENDENT_AMBULATORY_CARE_PROVIDER_SITE_OTHER): Payer: Medicare HMO | Admitting: Podiatry

## 2018-01-01 DIAGNOSIS — L97511 Non-pressure chronic ulcer of other part of right foot limited to breakdown of skin: Secondary | ICD-10-CM | POA: Diagnosis not present

## 2018-01-01 DIAGNOSIS — M2041 Other hammer toe(s) (acquired), right foot: Secondary | ICD-10-CM

## 2018-01-01 DIAGNOSIS — L928 Other granulomatous disorders of the skin and subcutaneous tissue: Secondary | ICD-10-CM

## 2018-01-01 DIAGNOSIS — E08621 Diabetes mellitus due to underlying condition with foot ulcer: Secondary | ICD-10-CM | POA: Diagnosis not present

## 2018-01-01 NOTE — Progress Notes (Signed)
  Subjective:  Patient ID: Johnathan Wright, male    DOB: Jul 14, 1946,  MRN: [id number]  Chief Complaint  Patient presents with  . Routine Post Op    dos 01.29.2019 Rt partial first ray maputation w/ filleted hallus flap Pt. stated," it's btter, no pain at all today." Tx: silvadene, betadine, and medihoney   72 y.o. male returns for wound care.  Still at [facility] rehab.  States that he is set for discharge with surgery after his last dose of antibiotics.  States that the facility has been applying medihoney, Silvadene, Betadine to different areas of his right foot wound.  States that it is looking better.  Denies pain.  Patient significant other also reported worsening wound to the right fourth toe states that the area previously looked red.  And swollen now improved.  Objective:  There were no vitals filed for this visit. General AA&O x3. Normal mood and affect.  Vascular Foot warm to touch.  Neurologic Sensation grossly diminished.  Dermatologic (Wound)  right hallux flap is viable and warm with good capillary refill.  Proximal dehiscence measuring 5 x 2 with fibro-granular base with slight excessive hyper granulation.  Other small linear areas along the flap site with slight dehiscence fibro-granular base.  No warmth or erythema.  No drainage noted.  No purulence no ascending cellulitis.  Right fourth toe distal tip ulceration measuring approximately 0.5 diameter with granular wound base.  Right second toe with healed wounds  Orthopedic: Right great toe amputation noted. Right fourth toe digital contracture semi-reducible Right second toe rectus   Assessment & Plan:  Patient was evaluated and treated and all questions answered.  Status post right partial first ray amputation with filleted toe flap -Wound dehiscence proximally measuring 5 x 2 cauterized with silver nitrate to promote epithelialization remainder of dehiscence medihoney applied with dry sterile dressing -Continue Santyl  wet-to-dry dressing to all open areas -Continue antibiotics to completion this Thursday  Procedure: Chemical Cauterization of Granulation Tissue Rationale: Cauterize granular wound base to promote healing.  Wound Measurements: 5 cm x 2 cm x 0.1 cm  Instrumentation: Silver nitrate stick x4 Dressing: Dry, sterile, compression dressing. Disposition: Patient tolerated procedure well. Patient to return in 1 week for follow-up.  Right fourth hammertoe with distal tip ulcer -Medihoney applied to the ulceration. -We will plan for flexion tenotomy at the next visit to alleviate contracture and prevent continued ulcer formation  Follow-up next week

## 2018-01-03 DIAGNOSIS — G6181 Chronic inflammatory demyelinating polyneuritis: Secondary | ICD-10-CM | POA: Diagnosis not present

## 2018-01-03 DIAGNOSIS — D333 Benign neoplasm of cranial nerves: Secondary | ICD-10-CM | POA: Diagnosis not present

## 2018-01-04 DIAGNOSIS — Z4781 Encounter for orthopedic aftercare following surgical amputation: Secondary | ICD-10-CM | POA: Diagnosis not present

## 2018-01-04 DIAGNOSIS — S81801D Unspecified open wound, right lower leg, subsequent encounter: Secondary | ICD-10-CM | POA: Diagnosis not present

## 2018-01-04 DIAGNOSIS — G6181 Chronic inflammatory demyelinating polyneuritis: Secondary | ICD-10-CM | POA: Diagnosis not present

## 2018-01-04 DIAGNOSIS — L89893 Pressure ulcer of other site, stage 3: Secondary | ICD-10-CM | POA: Diagnosis not present

## 2018-01-04 DIAGNOSIS — F319 Bipolar disorder, unspecified: Secondary | ICD-10-CM | POA: Diagnosis not present

## 2018-01-04 DIAGNOSIS — I5022 Chronic systolic (congestive) heart failure: Secondary | ICD-10-CM | POA: Diagnosis not present

## 2018-01-04 DIAGNOSIS — I110 Hypertensive heart disease with heart failure: Secondary | ICD-10-CM | POA: Diagnosis not present

## 2018-01-04 DIAGNOSIS — M86071 Acute hematogenous osteomyelitis, right ankle and foot: Secondary | ICD-10-CM | POA: Diagnosis not present

## 2018-01-04 DIAGNOSIS — I480 Paroxysmal atrial fibrillation: Secondary | ICD-10-CM | POA: Diagnosis not present

## 2018-01-04 DIAGNOSIS — S81802D Unspecified open wound, left lower leg, subsequent encounter: Secondary | ICD-10-CM | POA: Diagnosis not present

## 2018-01-04 DIAGNOSIS — E1169 Type 2 diabetes mellitus with other specified complication: Secondary | ICD-10-CM | POA: Diagnosis not present

## 2018-01-07 ENCOUNTER — Other Ambulatory Visit: Payer: Self-pay

## 2018-01-07 DIAGNOSIS — Z4781 Encounter for orthopedic aftercare following surgical amputation: Secondary | ICD-10-CM | POA: Diagnosis not present

## 2018-01-07 DIAGNOSIS — I5022 Chronic systolic (congestive) heart failure: Secondary | ICD-10-CM | POA: Diagnosis not present

## 2018-01-07 DIAGNOSIS — G6181 Chronic inflammatory demyelinating polyneuritis: Secondary | ICD-10-CM | POA: Diagnosis not present

## 2018-01-07 DIAGNOSIS — L89893 Pressure ulcer of other site, stage 3: Secondary | ICD-10-CM | POA: Diagnosis not present

## 2018-01-07 DIAGNOSIS — I110 Hypertensive heart disease with heart failure: Secondary | ICD-10-CM | POA: Diagnosis not present

## 2018-01-07 DIAGNOSIS — M86071 Acute hematogenous osteomyelitis, right ankle and foot: Secondary | ICD-10-CM | POA: Diagnosis not present

## 2018-01-07 DIAGNOSIS — E1169 Type 2 diabetes mellitus with other specified complication: Secondary | ICD-10-CM | POA: Diagnosis not present

## 2018-01-07 DIAGNOSIS — I480 Paroxysmal atrial fibrillation: Secondary | ICD-10-CM | POA: Diagnosis not present

## 2018-01-07 DIAGNOSIS — F319 Bipolar disorder, unspecified: Secondary | ICD-10-CM | POA: Diagnosis not present

## 2018-01-07 NOTE — Patient Outreach (Signed)
[facility]) Care Management  01/07/2018  Johnathan Wright 01/22/1946 [id number]     Transition of Care Referral  Referral Date: 01/07/18 Referral Source:  [facility] Discharge Report Date of Admission: unknown Diagnosis:unknown Date of Discharge: 01/03/18 Facility: [place]: [facility] Medicare    Outreach attempt # 1 to patient. Spoke with patient who voices he is doing well. He voices that he has very supportive friend/girlfriend that is assisting with all his needs. He has PCP appt this week. He voices that he has every two weeks appt with podiatrist. He denies any issues or concerns regarding transportation. He reports that he is getting meds delivered to home and is awaiting a delivery this afternoon. He voices no issues or concerns regarding managing/affording meds. Patient denies any [facility] or RN CM needs or concerns.       Plan: RN CM will notify [facility] administrative assistant of case status.    Enzo Montgomery, RN,BSN,CCM [place] Management Telephonic Care Management Coordinator Direct Phone: [phone] Toll Free: [phone] Fax: [phone]

## 2018-01-08 ENCOUNTER — Telehealth: Payer: Self-pay | Admitting: *Deleted

## 2018-01-08 ENCOUNTER — Ambulatory Visit (INDEPENDENT_AMBULATORY_CARE_PROVIDER_SITE_OTHER): Payer: Medicare HMO | Admitting: Podiatry

## 2018-01-08 DIAGNOSIS — M2041 Other hammer toe(s) (acquired), right foot: Secondary | ICD-10-CM

## 2018-01-08 NOTE — Telephone Encounter (Signed)
Johnathan Wright [facility] states she would like orders to begin PT for once this week, 2 times a week for 2 weeks.

## 2018-01-08 NOTE — Telephone Encounter (Signed)
Dr. March Rummage is currently caring for this patient -Dr. Cannon Kettle

## 2018-01-09 ENCOUNTER — Telehealth: Payer: Self-pay | Admitting: Podiatry

## 2018-01-09 DIAGNOSIS — I072 Rheumatic tricuspid stenosis and insufficiency: Secondary | ICD-10-CM | POA: Diagnosis not present

## 2018-01-09 DIAGNOSIS — L928 Other granulomatous disorders of the skin and subcutaneous tissue: Secondary | ICD-10-CM | POA: Diagnosis not present

## 2018-01-09 DIAGNOSIS — R600 Localized edema: Secondary | ICD-10-CM | POA: Diagnosis not present

## 2018-01-09 NOTE — Telephone Encounter (Signed)
Hello, this is [name] with [facility]. The pt came out of the nursing home so we started back with him. I wanted to make sure Dr. March Rummage is signing our orders again. I have a question, in one are it says to use Santyl once a day on that right foot and another place it says every other day. The caregiver said the pt said it's every other day. I just wanted to verify which one was correct. I know he saw you guys yesterday. So just give me a call back at [phone]. Thank you. Bye.

## 2018-01-09 NOTE — Telephone Encounter (Signed)
I informed [facility] pt is to have the santyl applied to the right foot qod.

## 2018-01-09 NOTE — Telephone Encounter (Signed)
Ok to start PT. Can we send orders?

## 2018-01-10 DIAGNOSIS — M545 Low back pain: Secondary | ICD-10-CM | POA: Diagnosis not present

## 2018-01-10 DIAGNOSIS — L89893 Pressure ulcer of other site, stage 3: Secondary | ICD-10-CM | POA: Diagnosis not present

## 2018-01-10 DIAGNOSIS — I480 Paroxysmal atrial fibrillation: Secondary | ICD-10-CM | POA: Diagnosis not present

## 2018-01-10 DIAGNOSIS — E119 Type 2 diabetes mellitus without complications: Secondary | ICD-10-CM | POA: Diagnosis not present

## 2018-01-10 DIAGNOSIS — M86071 Acute hematogenous osteomyelitis, right ankle and foot: Secondary | ICD-10-CM | POA: Diagnosis not present

## 2018-01-10 DIAGNOSIS — E782 Mixed hyperlipidemia: Secondary | ICD-10-CM | POA: Diagnosis not present

## 2018-01-10 DIAGNOSIS — I10 Essential (primary) hypertension: Secondary | ICD-10-CM | POA: Diagnosis not present

## 2018-01-10 DIAGNOSIS — M8608 Acute hematogenous osteomyelitis, other sites: Secondary | ICD-10-CM | POA: Diagnosis not present

## 2018-01-10 DIAGNOSIS — F322 Major depressive disorder, single episode, severe without psychotic features: Secondary | ICD-10-CM | POA: Diagnosis not present

## 2018-01-10 DIAGNOSIS — Z4781 Encounter for orthopedic aftercare following surgical amputation: Secondary | ICD-10-CM | POA: Diagnosis not present

## 2018-01-10 DIAGNOSIS — I110 Hypertensive heart disease with heart failure: Secondary | ICD-10-CM | POA: Diagnosis not present

## 2018-01-10 DIAGNOSIS — G894 Chronic pain syndrome: Secondary | ICD-10-CM | POA: Diagnosis not present

## 2018-01-10 DIAGNOSIS — E1169 Type 2 diabetes mellitus with other specified complication: Secondary | ICD-10-CM | POA: Diagnosis not present

## 2018-01-10 DIAGNOSIS — G6181 Chronic inflammatory demyelinating polyneuritis: Secondary | ICD-10-CM | POA: Diagnosis not present

## 2018-01-10 DIAGNOSIS — F319 Bipolar disorder, unspecified: Secondary | ICD-10-CM | POA: Diagnosis not present

## 2018-01-10 DIAGNOSIS — I5022 Chronic systolic (congestive) heart failure: Secondary | ICD-10-CM | POA: Diagnosis not present

## 2018-01-11 ENCOUNTER — Ambulatory Visit (INDEPENDENT_AMBULATORY_CARE_PROVIDER_SITE_OTHER): Payer: Medicare HMO | Admitting: Sports Medicine

## 2018-01-11 ENCOUNTER — Telehealth: Payer: Self-pay | Admitting: Podiatry

## 2018-01-11 ENCOUNTER — Encounter: Payer: Self-pay | Admitting: Sports Medicine

## 2018-01-11 DIAGNOSIS — E11621 Type 2 diabetes mellitus with foot ulcer: Secondary | ICD-10-CM

## 2018-01-11 DIAGNOSIS — S91301D Unspecified open wound, right foot, subsequent encounter: Secondary | ICD-10-CM

## 2018-01-11 DIAGNOSIS — Z9889 Other specified postprocedural states: Secondary | ICD-10-CM

## 2018-01-11 DIAGNOSIS — I739 Peripheral vascular disease, unspecified: Secondary | ICD-10-CM

## 2018-01-11 DIAGNOSIS — L03115 Cellulitis of right lower limb: Secondary | ICD-10-CM

## 2018-01-11 DIAGNOSIS — L97521 Non-pressure chronic ulcer of other part of left foot limited to breakdown of skin: Secondary | ICD-10-CM

## 2018-01-11 MED ORDER — SULFAMETHOXAZOLE-TRIMETHOPRIM 400-80 MG PO TABS: 1.0000 | ORAL_TABLET | Freq: Two times a day (BID) | ORAL | 0 refills | Status: DC

## 2018-01-11 NOTE — Telephone Encounter (Signed)
Hey, this is [name] with [facility]. I'm calling in regards to [name]. I saw him after hours yesterday for his wound care because he had a doctors appointment yesterday morning. His whole lower leg the right side is really really red, really hot, no pain, no fever and no chills. I'm really concerned because the other leg looks good and there is a lot of swelling in his right lower leg also. The caregiver said that Dr. March Rummage said to wrap it in ace wraps, so I did that but I'm really concerned about that leg. Give me a call back at [phone]. Thank you. Bye.

## 2018-01-11 NOTE — Progress Notes (Signed)
Subjective: BIRCH FARINO is a 72 y.o. male patient seen in office for evaluation of worsening redness warmth and cellulitis to right lower extremity and for follow-up wound care.  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 March Rummage.  Patient reports has home nursing who noted the increase of warmth and redness of the right leg when she came to visit on yesterday.  Patient is assisted by significant other who states that the redness on today is a little better than it was on yesterday. Denies fever/vomiting/chills/night sweats/shortness of breath/pain. Patient has no other pedal complaints at this time.  Patient Active Problem List   Diagnosis Date Noted  . 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 Abnormality of gait 09/13/2015  . 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 allopurinol (ZYLOPRIM) 100 MG tablet Take 100 mg by mouth daily.    Marland Kitchen amoxicillin-clavulanate (AUGMENTIN) 875-125 MG tablet Take 1 tablet by mouth 2 (two) times daily. (Patient not taking: Reported on 11/08/2017) 20 tablet 0  . ARIPiprazole (ABILIFY) 2 MG tablet Take 2 mg by mouth daily.    Marland Kitchen aspirin 81 MG tablet Take 81 mg by mouth daily.    Marland Kitchen atorvastatin (LIPITOR) 10 MG tablet Take 10 mg by mouth daily.    . Bilberry 1000 MG CAPS Take by mouth.    Marland Kitchen CINNAMON PO Take by mouth daily.    . ciprofloxacin (CIPRO) 500 MG tablet Take 1 tablet (500 mg total) by mouth 2 (two) times daily. 28 tablet 0  . clindamycin (CLEOCIN) 300 MG capsule Take 1 capsule (300 mg total) by mouth 3 (three) times daily. 42 capsule 0  . clotrimazole (LOTRIMIN) 1 % cream Apply 1 application topically 2 (two) times daily.    . collagenase (SANTYL) ointment Apply 1 application topically daily. 15 g 5  . cyanocobalamin (,VITAMIN B-12,) 1000 MCG/ML injection Inject 1 mL (1,000 mcg  total) into the muscle once. Vitamin B12 1 ml IM daily x 7 days then 1 ml IM weekly x 4 weeks then 1 ml IM monthly x 1 year. (Patient not taking: Reported on 11/08/2017) 1 mL 0  . desvenlafaxine (PRISTIQ) 100 MG 24 hr tablet Take 100 mg by mouth daily.    . fentaNYL (DURAGESIC - DOSED MCG/HR) 50 MCG/HR Place 50 mcg onto the skin every 3 (three) days.    . furosemide (LASIX) 40 MG tablet Take 40 mg by mouth.    . Insulin Glargine (TOUJEO SOLOSTAR) 300 UNIT/ML SOPN Inject into the skin.    Marland Kitchen L-Arginine 500 MG CAPS Take by mouth.    . Liraglutide (VICTOZA) 18 MG/3ML SOPN Inject into the skin.    . Lutein 40 MG CAPS Take by mouth.    . Lutein 6 MG TABS Take by mouth.    . LYSINE PO Take 1,000 mg by mouth daily.    . metFORMIN (GLUCOPHAGE) 1000 MG tablet Take 1,000 mg by mouth 2 (two) times daily with a meal.    . oxycodone (OXY-IR) 5 MG capsule Take 5 mg by mouth every 4 (four) hours as needed.    . vortioxetine HBr (TRINTELLIX) 10 MG TABS Take 10 mg by mouth 1 day or 1 dose.     No current facility-administered medications on file prior to visit.    No Known Allergies  No results found for this or any  previous visit (from the past 2160 hour(s)).  Objective: There were no vitals filed for this visit.  General: Patient is awake, alert, oriented x 3 and in no acute distress.  Dermatology: Skin is warm and dry bilateral with a amputation site wound that measures 5.5 x 2 cm with a mix of fibroglandular tissue with no localized warmth or redness however there is significant swelling and mild clear drainage.  There is significant warmth and redness starting at the ankle extending up the leg to the level of the mid calf with warmth consistent with cellulitis of legs with superimposed venous stasis.  To right fourth toe there is a scabbed over dry wound with no active drainage, no fluctuance, no warmth, no redness, no swelling, no acute signs of infection.  To left second toe there is a partial  thickness ulceration with a fibrinous base over the dorsal aspect of the interphalangeal joint that does not probe to bone, no active drainage, no swelling, no erythema, no edema, no other acute signs of infection.  Vascular: Pedal pulses are nonpalpable due to severe venous stasis and chronic trophic skin changes and edema  Neurologic: Protective sensation diminished bilateral.  Musculosketal: There is no pain with palpation to right foot amputation site wound or to right lower leg or calf.  No acute concern for DVT most likely significant for cellulitis.  No results for input(s): GRAMSTAIN, LABORGA in the last 8760 hours.  Assessment and Plan:  Problem List Items Addressed This Visit    None    Visit Diagnoses    Cellulitis of right leg    -  Primary   Relevant Medications   sulfamethoxazole-trimethoprim (BACTRIM) 400-80 MG tablet   Open wound of right foot, subsequent encounter       Amputation site wound, right foot   Post-operative state       PAD (peripheral artery disease) (HCC)       Diabetic ulcer of toe of left foot associated with type 2 diabetes mellitus, limited to breakdown of skin ([place])         -Examined patient and discussed the progression of the wounds and treatment alternatives. -Prescribed Bactrim to take for cellulitis and right leg and advised patient to closely monitor area of redness if worsening or if other symptoms are present like shortness of breath chest pain or increased pain and swelling and calf to go to ER immediately -Applied antibiotic cream to left second toe and to right open amputation wound and Betadine to right fourth toe and dry sterile dressing and instructed patient to have nursing to continue with Santyl to right open amputation wound, Betadine to right fourth toe, and antibiotic cream to left second toe on Mondays and Thursdays. -Encouraged continue with ACE compression and elevation of legs to assist with edema control -Patient to return to  office on Tues as scheduled or sooner if problems arise.  Landis Martins, DPM

## 2018-01-11 NOTE — Telephone Encounter (Signed)
I spoke with pt and he said he hasn't take the wrap off but [facility] was very concerned about it. Pt asked if I would speak with his caregiver - Enid Derry. I spoke with Enid Derry and told her I was concerned pt had cellulitis and she states he has had it before and it looks like it again. I asked if they could make it to a 10:45am appt today in [place] and she said they could.

## 2018-01-12 DIAGNOSIS — I480 Paroxysmal atrial fibrillation: Secondary | ICD-10-CM | POA: Diagnosis not present

## 2018-01-12 DIAGNOSIS — G6181 Chronic inflammatory demyelinating polyneuritis: Secondary | ICD-10-CM | POA: Diagnosis not present

## 2018-01-12 DIAGNOSIS — M86071 Acute hematogenous osteomyelitis, right ankle and foot: Secondary | ICD-10-CM | POA: Diagnosis not present

## 2018-01-12 DIAGNOSIS — I5022 Chronic systolic (congestive) heart failure: Secondary | ICD-10-CM | POA: Diagnosis not present

## 2018-01-12 DIAGNOSIS — Z4781 Encounter for orthopedic aftercare following surgical amputation: Secondary | ICD-10-CM | POA: Diagnosis not present

## 2018-01-12 DIAGNOSIS — F319 Bipolar disorder, unspecified: Secondary | ICD-10-CM | POA: Diagnosis not present

## 2018-01-12 DIAGNOSIS — L89893 Pressure ulcer of other site, stage 3: Secondary | ICD-10-CM | POA: Diagnosis not present

## 2018-01-12 DIAGNOSIS — I110 Hypertensive heart disease with heart failure: Secondary | ICD-10-CM | POA: Diagnosis not present

## 2018-01-12 DIAGNOSIS — E1169 Type 2 diabetes mellitus with other specified complication: Secondary | ICD-10-CM | POA: Diagnosis not present

## 2018-01-14 DIAGNOSIS — Z4781 Encounter for orthopedic aftercare following surgical amputation: Secondary | ICD-10-CM | POA: Diagnosis not present

## 2018-01-14 DIAGNOSIS — I110 Hypertensive heart disease with heart failure: Secondary | ICD-10-CM | POA: Diagnosis not present

## 2018-01-14 DIAGNOSIS — E1169 Type 2 diabetes mellitus with other specified complication: Secondary | ICD-10-CM | POA: Diagnosis not present

## 2018-01-14 DIAGNOSIS — F319 Bipolar disorder, unspecified: Secondary | ICD-10-CM | POA: Diagnosis not present

## 2018-01-14 DIAGNOSIS — L89893 Pressure ulcer of other site, stage 3: Secondary | ICD-10-CM | POA: Diagnosis not present

## 2018-01-14 DIAGNOSIS — G6181 Chronic inflammatory demyelinating polyneuritis: Secondary | ICD-10-CM | POA: Diagnosis not present

## 2018-01-14 DIAGNOSIS — I480 Paroxysmal atrial fibrillation: Secondary | ICD-10-CM | POA: Diagnosis not present

## 2018-01-14 DIAGNOSIS — I5022 Chronic systolic (congestive) heart failure: Secondary | ICD-10-CM | POA: Diagnosis not present

## 2018-01-14 DIAGNOSIS — M86071 Acute hematogenous osteomyelitis, right ankle and foot: Secondary | ICD-10-CM | POA: Diagnosis not present

## 2018-01-15 ENCOUNTER — Ambulatory Visit (INDEPENDENT_AMBULATORY_CARE_PROVIDER_SITE_OTHER): Payer: Medicare HMO | Admitting: Podiatry

## 2018-01-15 DIAGNOSIS — L928 Other granulomatous disorders of the skin and subcutaneous tissue: Secondary | ICD-10-CM | POA: Diagnosis not present

## 2018-01-15 DIAGNOSIS — I872 Venous insufficiency (chronic) (peripheral): Secondary | ICD-10-CM | POA: Diagnosis not present

## 2018-01-15 DIAGNOSIS — R600 Localized edema: Secondary | ICD-10-CM | POA: Diagnosis not present

## 2018-01-15 DIAGNOSIS — I072 Rheumatic tricuspid stenosis and insufficiency: Secondary | ICD-10-CM | POA: Diagnosis not present

## 2018-01-15 NOTE — Progress Notes (Signed)
  Subjective:  Patient ID: Johnathan Wright, male    DOB: Jan 17, 1946,  MRN: [id number]  Chief Complaint  Patient presents with  . Foot Ulcer    F/U Rt ulcer Pt. stated," it's doing fine, no pain." -bloody drainage   72 y.o. male returns for wound care. Was seen last week for R leg cellulitis. Improved. Denies pain. Denies new issues.  Objective:  There were no vitals filed for this visit. General AA&O x3. Normal mood and affect.  Vascular Foot warm to touch.  Neurologic Sensation grossly diminished.  Dermatologic (Wound) R hallux flap viable. Wound dehiscence 4 x 2.5 hypergranular base. No erythema. No signs of infection.  Right fourth toe distal tip ulceration with hypergranular base. Stab incision plantar aspect of the toe healing well. Right second toe with healed wounds  L foot without open ulcerations.  Orthopedic: Right great toe amputation noted. Right fourth toe rectus. Right second toe rectus   Assessment & Plan:  Patient was evaluated and treated and all questions answered.  R 4th toe s/p Flexor tenotomy -Healing well. Ulceration facing distal, toe rectus. Should resolve over time. Hypergranular wound base cauterized with silver nitrate.  Status post right partial first ray amputation with filleted toe flap -Wound hypergranular today. Cauterized with silver nitrate as below.  Procedure: Chemical Cauterization of Granulation Tissue Rationale: Cauterize granular wound base to promote healing.  Wound Measurements: 4.5 cm x 2.5 cm x -0.1 cm  Instrumentation: Silver nitrate stick x4 Dressing: Dry, sterile, compression dressing. Disposition: Patient tolerated procedure well. Patient to return in 1 week for follow-up.  Right fourth hammertoe with distal tip ulcer -Medihoney applied to the ulceration. -We will plan for flexor tenotomy at the next visit to alleviate contracture and prevent continued ulcer formation  RLE Edema with resolving cellulitis -Multilayer  compression wrap consisting of Ace bandage, cast padding, Coban applied.  L Foot Ulcerations -Remain healed.  Follow-up next week

## 2018-01-16 DIAGNOSIS — L89893 Pressure ulcer of other site, stage 3: Secondary | ICD-10-CM | POA: Diagnosis not present

## 2018-01-16 DIAGNOSIS — F319 Bipolar disorder, unspecified: Secondary | ICD-10-CM | POA: Diagnosis not present

## 2018-01-16 DIAGNOSIS — I480 Paroxysmal atrial fibrillation: Secondary | ICD-10-CM | POA: Diagnosis not present

## 2018-01-16 DIAGNOSIS — S7000XA Contusion of unspecified hip, initial encounter: Secondary | ICD-10-CM | POA: Diagnosis not present

## 2018-01-16 DIAGNOSIS — M86071 Acute hematogenous osteomyelitis, right ankle and foot: Secondary | ICD-10-CM | POA: Diagnosis not present

## 2018-01-16 DIAGNOSIS — E1169 Type 2 diabetes mellitus with other specified complication: Secondary | ICD-10-CM | POA: Diagnosis not present

## 2018-01-16 DIAGNOSIS — E1140 Type 2 diabetes mellitus with diabetic neuropathy, unspecified: Secondary | ICD-10-CM | POA: Diagnosis not present

## 2018-01-16 DIAGNOSIS — I5022 Chronic systolic (congestive) heart failure: Secondary | ICD-10-CM | POA: Diagnosis not present

## 2018-01-16 DIAGNOSIS — I110 Hypertensive heart disease with heart failure: Secondary | ICD-10-CM | POA: Diagnosis not present

## 2018-01-16 DIAGNOSIS — J449 Chronic obstructive pulmonary disease, unspecified: Secondary | ICD-10-CM | POA: Diagnosis not present

## 2018-01-16 DIAGNOSIS — Z4781 Encounter for orthopedic aftercare following surgical amputation: Secondary | ICD-10-CM | POA: Diagnosis not present

## 2018-01-16 DIAGNOSIS — G894 Chronic pain syndrome: Secondary | ICD-10-CM | POA: Diagnosis not present

## 2018-01-16 DIAGNOSIS — M5136 Other intervertebral disc degeneration, lumbar region: Secondary | ICD-10-CM | POA: Diagnosis not present

## 2018-01-16 DIAGNOSIS — G6181 Chronic inflammatory demyelinating polyneuritis: Secondary | ICD-10-CM | POA: Diagnosis not present

## 2018-01-16 DIAGNOSIS — I739 Peripheral vascular disease, unspecified: Secondary | ICD-10-CM | POA: Diagnosis not present

## 2018-01-17 DIAGNOSIS — I5022 Chronic systolic (congestive) heart failure: Secondary | ICD-10-CM | POA: Diagnosis not present

## 2018-01-17 DIAGNOSIS — I110 Hypertensive heart disease with heart failure: Secondary | ICD-10-CM | POA: Diagnosis not present

## 2018-01-17 DIAGNOSIS — E1169 Type 2 diabetes mellitus with other specified complication: Secondary | ICD-10-CM | POA: Diagnosis not present

## 2018-01-17 DIAGNOSIS — M86071 Acute hematogenous osteomyelitis, right ankle and foot: Secondary | ICD-10-CM | POA: Diagnosis not present

## 2018-01-17 DIAGNOSIS — L89893 Pressure ulcer of other site, stage 3: Secondary | ICD-10-CM | POA: Diagnosis not present

## 2018-01-17 DIAGNOSIS — Z4781 Encounter for orthopedic aftercare following surgical amputation: Secondary | ICD-10-CM | POA: Diagnosis not present

## 2018-01-17 DIAGNOSIS — I480 Paroxysmal atrial fibrillation: Secondary | ICD-10-CM | POA: Diagnosis not present

## 2018-01-17 DIAGNOSIS — F319 Bipolar disorder, unspecified: Secondary | ICD-10-CM | POA: Diagnosis not present

## 2018-01-17 DIAGNOSIS — G6181 Chronic inflammatory demyelinating polyneuritis: Secondary | ICD-10-CM | POA: Diagnosis not present

## 2018-01-19 NOTE — Progress Notes (Signed)
  Subjective:  Patient ID: Johnathan Wright, male    DOB: [birth year],  MRN: [id number]  Chief Complaint  Patient presents with  . Wound Check    F/U Rt foot Pt. denies pain/swelling/draingar PT. stated," I can't really see, so I don't know if it's getting better or not."   72 y.o. male returns today for planned flexor tenotomy of the right 4th digit.  Objective:  There were no vitals filed for this visit.  General AA&O x3. Normal mood and affect.  Vascular Pedal pulses palpable.  Neurologic Epicritic sensation grossly intact.  Dermatologic Pre-ulcerative callus at the tip of the right, 4th toe  Orthopedic: Semi-reducible hammertoe deformity right, 4th toe    Assessment & Plan:  Patient was evaluated and treated and all questions answered.  Hammertoe R 4th toe with pre-ulcerative callus -Flexor tenotomy as below. -Advised to remove the dressing in 24 hours and apply a band-aid and triple abx ointment every day thereafter.  Procedure: Flexor Tenotomy Indication for Procedure: toe with semi-reducible hammertoe with distal tip ulceration. Flexor tenotomy indicated to alleviate contracture, reduce pressure, and enhance healing of the ulceration. Location: right, 4th toe Anesthesia: Lidocaine 1% plain; 1.5 mL and Marcaine 0.5% plain; 1.5 mL digital block Instrumentation: 18 g needle  Technique: The toe was anesthetized as above and prepped in the usual fashion. The toe was exsanquinated and a tourniquet was secured at the base of the toe. An 18g needle was then used to percutaneously release the flexor tendon at the plantar surface of the toe with noted release of the hammertoe deformity. The incision was then dressed with antibiotic ointment and band-aid. Compression splint dressing applied. Patient tolerated the procedure well. Dressing: Dry, sterile, compression dressing. Disposition: Patient tolerated procedure well. Patient to return in 1 week for follow-up.    No follow-ups on  file.

## 2018-01-20 NOTE — Progress Notes (Signed)
  Subjective:  Patient ID: Johnathan Wright, male    DOB: 10-10-1946,  MRN: [id number]  No chief complaint on file.   DOS: 11/20/17 Procedure: R partial first ray amputation with fillleted hallux flap  72 y.o. male returns for post-op check. Denies N/V/F/Ch. Doing well. No complaints.  Objective:   General AA&O x3. Normal mood and affect.  Vascular Foot warm and well perfused.  Neurologic Gross sensation intact.  Dermatologic Skin healing well without signs of infection. Skin continues to heal distally. Proximally wound granulating in nicely with some fibrotic/tendinous strands present. L 1st MPJ ulceration healed.  Orthopedic: No tenderness to palpation noted about the surgical site.   Assessment & Plan:  Patient was evaluated and treated and all questions answered.  S/p R partial first ray amputation with fillleted hallux flap -Progressing well. Wound with continued fibrosis proximally. Will switch to Santyl qD to promote breakdown of fibrosis/tendinous strands. -Will send updated orders to [facility]. -Debridement today of proximal wound with tissue nipper. Covered under global. -Dressed with medihoney and DSD.

## 2018-01-21 DIAGNOSIS — I110 Hypertensive heart disease with heart failure: Secondary | ICD-10-CM | POA: Diagnosis not present

## 2018-01-21 DIAGNOSIS — E1169 Type 2 diabetes mellitus with other specified complication: Secondary | ICD-10-CM | POA: Diagnosis not present

## 2018-01-21 DIAGNOSIS — F319 Bipolar disorder, unspecified: Secondary | ICD-10-CM | POA: Diagnosis not present

## 2018-01-21 DIAGNOSIS — Z4781 Encounter for orthopedic aftercare following surgical amputation: Secondary | ICD-10-CM | POA: Diagnosis not present

## 2018-01-21 DIAGNOSIS — L89893 Pressure ulcer of other site, stage 3: Secondary | ICD-10-CM | POA: Diagnosis not present

## 2018-01-21 DIAGNOSIS — I480 Paroxysmal atrial fibrillation: Secondary | ICD-10-CM | POA: Diagnosis not present

## 2018-01-21 DIAGNOSIS — I5022 Chronic systolic (congestive) heart failure: Secondary | ICD-10-CM | POA: Diagnosis not present

## 2018-01-21 DIAGNOSIS — M86071 Acute hematogenous osteomyelitis, right ankle and foot: Secondary | ICD-10-CM | POA: Diagnosis not present

## 2018-01-21 DIAGNOSIS — G6181 Chronic inflammatory demyelinating polyneuritis: Secondary | ICD-10-CM | POA: Diagnosis not present

## 2018-01-22 ENCOUNTER — Encounter: Payer: Medicare HMO | Admitting: Podiatry

## 2018-01-24 DIAGNOSIS — Z4781 Encounter for orthopedic aftercare following surgical amputation: Secondary | ICD-10-CM | POA: Diagnosis not present

## 2018-01-24 DIAGNOSIS — I110 Hypertensive heart disease with heart failure: Secondary | ICD-10-CM | POA: Diagnosis not present

## 2018-01-24 DIAGNOSIS — L89893 Pressure ulcer of other site, stage 3: Secondary | ICD-10-CM | POA: Diagnosis not present

## 2018-01-24 DIAGNOSIS — F319 Bipolar disorder, unspecified: Secondary | ICD-10-CM | POA: Diagnosis not present

## 2018-01-24 DIAGNOSIS — I5022 Chronic systolic (congestive) heart failure: Secondary | ICD-10-CM | POA: Diagnosis not present

## 2018-01-24 DIAGNOSIS — G6181 Chronic inflammatory demyelinating polyneuritis: Secondary | ICD-10-CM | POA: Diagnosis not present

## 2018-01-24 DIAGNOSIS — M86071 Acute hematogenous osteomyelitis, right ankle and foot: Secondary | ICD-10-CM | POA: Diagnosis not present

## 2018-01-24 DIAGNOSIS — I480 Paroxysmal atrial fibrillation: Secondary | ICD-10-CM | POA: Diagnosis not present

## 2018-01-24 DIAGNOSIS — E1169 Type 2 diabetes mellitus with other specified complication: Secondary | ICD-10-CM | POA: Diagnosis not present

## 2018-01-25 ENCOUNTER — Ambulatory Visit (INDEPENDENT_AMBULATORY_CARE_PROVIDER_SITE_OTHER): Payer: Medicare HMO | Admitting: Sports Medicine

## 2018-01-25 ENCOUNTER — Encounter: Payer: Self-pay | Admitting: Sports Medicine

## 2018-01-25 DIAGNOSIS — I872 Venous insufficiency (chronic) (peripheral): Secondary | ICD-10-CM

## 2018-01-25 DIAGNOSIS — L928 Other granulomatous disorders of the skin and subcutaneous tissue: Secondary | ICD-10-CM

## 2018-01-25 DIAGNOSIS — Z9889 Other specified postprocedural states: Secondary | ICD-10-CM

## 2018-01-25 DIAGNOSIS — S91301D Unspecified open wound, right foot, subsequent encounter: Secondary | ICD-10-CM

## 2018-01-25 NOTE — Progress Notes (Signed)
Subjective: Johnathan Wright is a 72 y.o. male patient seen in office for follow-up wound care.  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 March Rummage.  Patient reports has home nursing who is helping with every other day dressing changes of Santyl to wound sites on the right foot. Denies fever/vomiting/chills/night sweats/shortness of breath/pain. Patient has no other pedal complaints at this time.  Patient is assisted by wife/significant other at this visit.  Patient Active Problem List   Diagnosis Date Noted  . CIDP (chronic inflammatory demyelinating polyneuropathy) ([place]) 09/13/2015  . Abnormality of gait 09/13/2015  . Acoustic neuroma ([place]) 09/13/2015   Current Outpatient Medications on File Prior to Visit  Medication Sig Dispense Refill  . allopurinol (ZYLOPRIM) 100 MG tablet Take 100 mg by mouth daily.    Marland Kitchen amoxicillin-clavulanate (AUGMENTIN) 875-125 MG tablet Take 1 tablet by mouth 2 (two) times daily. (Patient not taking: Reported on 11/08/2017) 20 tablet 0  . ARIPiprazole (ABILIFY) 2 MG tablet Take 2 mg by mouth daily.    Marland Kitchen aspirin 81 MG tablet Take 81 mg by mouth daily.    Marland Kitchen atorvastatin (LIPITOR) 10 MG tablet Take 10 mg by mouth daily.    . Bilberry 1000 MG CAPS Take by mouth.    Marland Kitchen CINNAMON PO Take by mouth daily.    . ciprofloxacin (CIPRO) 500 MG tablet Take 1 tablet (500 mg total) by mouth 2 (two) times daily. 28 tablet 0  . clindamycin (CLEOCIN) 300 MG capsule Take 1 capsule (300 mg total) by mouth 3 (three) times daily. 42 capsule 0  . clotrimazole (LOTRIMIN) 1 % cream Apply 1 application topically 2 (two) times daily.    . collagenase (SANTYL) ointment Apply 1 application topically daily. 15 g 5  . cyanocobalamin (,VITAMIN B-12,) 1000 MCG/ML injection Inject 1 mL (1,000 mcg total) into the muscle once. Vitamin B12 1 ml IM daily x 7 days then 1 ml IM weekly x 4 weeks then 1 ml IM monthly x 1 year. (Patient not taking:  Reported on 11/08/2017) 1 mL 0  . desvenlafaxine (PRISTIQ) 100 MG 24 hr tablet Take 100 mg by mouth daily.    . fentaNYL (DURAGESIC - DOSED MCG/HR) 50 MCG/HR Place 50 mcg onto the skin every 3 (three) days.    . furosemide (LASIX) 40 MG tablet Take 40 mg by mouth.    . Insulin Glargine (TOUJEO SOLOSTAR) 300 UNIT/ML SOPN Inject into the skin.    Marland Kitchen L-Arginine 500 MG CAPS Take by mouth.    . Liraglutide (VICTOZA) 18 MG/3ML SOPN Inject into the skin.    . Lutein 40 MG CAPS Take by mouth.    . Lutein 6 MG TABS Take by mouth.    . LYSINE PO Take 1,000 mg by mouth daily.    . metFORMIN (GLUCOPHAGE) 1000 MG tablet Take 1,000 mg by mouth 2 (two) times daily with a meal.    . oxycodone (OXY-IR) 5 MG capsule Take 5 mg by mouth every 4 (four) hours as needed.    . sulfamethoxazole-trimethoprim (BACTRIM) 400-80 MG tablet Take 1 tablet by mouth 2 (two) times daily. 28 tablet 0  . vortioxetine HBr (TRINTELLIX) 10 MG TABS Take 10 mg by mouth 1 day or 1 dose.     No current facility-administered medications on file prior to visit.    No Known Allergies  No results found for this or any previous visit (from the past 2160 hour(s)).  Objective: There were no vitals filed for this visit.  General: Patient is awake, alert, oriented x 3 and in no acute distress.  Dermatology: Skin is warm and dry bilateral with a amputation site wound that measures 5.0 x 2 cm with a mix of hypergranular tissue with no localized warmth or redness however there is significant swelling.  There is significant superimposed venous stasis to both lower extremities with resolved cellulitis.  To right fourth toe there is a partial thickness ulceration to the distal tuft that measures 1 x 1 cm with a granular base once debrided, no fluctuance, no warmth, no redness, no swelling, no acute signs of infection.  Vascular: Pedal pulses are nonpalpable due to severe venous stasis and chronic trophic skin changes and edema  Neurologic:  Protective sensation diminished bilateral.  Musculosketal: There is no pain with palpation to right foot amputation site wound or to right lower leg or calf.  No results for input(s): GRAMSTAIN, LABORGA in the last 8760 hours.  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 -Mechanically debrided wounds using a sterile tissue nipper at the right fourth toe and right amputation site wound bed and cauterize excessive granulation tissue at both sites using silver nitrate and then applied dry sterile compressive dressing.  Patient tolerated the procedure well and applied Ace wrap to level of knee to assist with edema control -Nursing to continue with Santyl to right open amputation wound, Medihoney to right fourth toe on Mondays and Thursdays and follow any additional orders as given by Dr. March Rummage. -Patient to return to office to see Dr. March Rummage as scheduled or sooner if problems arise.  Landis Martins, DPM

## 2018-01-28 DIAGNOSIS — E1169 Type 2 diabetes mellitus with other specified complication: Secondary | ICD-10-CM | POA: Diagnosis not present

## 2018-01-28 DIAGNOSIS — I5022 Chronic systolic (congestive) heart failure: Secondary | ICD-10-CM | POA: Diagnosis not present

## 2018-01-28 DIAGNOSIS — I110 Hypertensive heart disease with heart failure: Secondary | ICD-10-CM | POA: Diagnosis not present

## 2018-01-28 DIAGNOSIS — I480 Paroxysmal atrial fibrillation: Secondary | ICD-10-CM | POA: Diagnosis not present

## 2018-01-28 DIAGNOSIS — G6181 Chronic inflammatory demyelinating polyneuritis: Secondary | ICD-10-CM | POA: Diagnosis not present

## 2018-01-28 DIAGNOSIS — Z4781 Encounter for orthopedic aftercare following surgical amputation: Secondary | ICD-10-CM | POA: Diagnosis not present

## 2018-01-28 DIAGNOSIS — L89893 Pressure ulcer of other site, stage 3: Secondary | ICD-10-CM | POA: Diagnosis not present

## 2018-01-28 DIAGNOSIS — M86071 Acute hematogenous osteomyelitis, right ankle and foot: Secondary | ICD-10-CM | POA: Diagnosis not present

## 2018-01-28 DIAGNOSIS — F319 Bipolar disorder, unspecified: Secondary | ICD-10-CM | POA: Diagnosis not present

## 2018-01-29 ENCOUNTER — Ambulatory Visit (INDEPENDENT_AMBULATORY_CARE_PROVIDER_SITE_OTHER): Payer: Medicare HMO | Admitting: Podiatry

## 2018-01-29 DIAGNOSIS — L928 Other granulomatous disorders of the skin and subcutaneous tissue: Secondary | ICD-10-CM

## 2018-01-29 DIAGNOSIS — L97521 Non-pressure chronic ulcer of other part of left foot limited to breakdown of skin: Secondary | ICD-10-CM

## 2018-01-29 DIAGNOSIS — L97511 Non-pressure chronic ulcer of other part of right foot limited to breakdown of skin: Secondary | ICD-10-CM | POA: Diagnosis not present

## 2018-01-29 NOTE — Progress Notes (Signed)
  Subjective:  Patient ID: Johnathan Wright, male    DOB: February 10, 1946,  MRN: [id number]  Chief Complaint  Patient presents with  . granulomatous    F/U Rt foot Pt. stated," it's doing okay." Tx: every other day dressing change    DOS: 11/20/17 Procedure: Right first metatarsal resection with filleted toe flap.  72 y.o. male returns for wound care.  Missed last appointment due to the snow.  States that the wound is doing okay.  Has been dressing every other day.  Has been wearing normal shoe gear to his left foot.  New abrasions noted to the left foot thinks that they happen from hitting his foot.  Objective:  There were no vitals filed for this visit. General AA&O x3. Normal mood and affect.  Vascular Foot warm to touch.  Neurologic Sensation grossly diminished.  Dermatologic (Wound) R hallux flap viable. Remaining open dehiscence  3x1.5 distal with granular base. 3.5x1.5 proximal with hypergranular base.  Right fourth toe distal tip ulceration with hypergranular base.  L foot DIPJ abrasion L hallux, PIPJ 2nd, 3rd.  All superficial  Orthopedic: Right great toe amputation noted. Right fourth toe rectus. Right second toe rectus   Assessment & Plan:  Patient was evaluated and treated and all questions answered.  R 4th toe s/p Flexor tenotomy -Healing well. Ulceration facing distal, toe rectus. Resolving. Dressed with abx ointment and DSD.  Status post right partial first ray amputation with filleted toe flap -Hyper granular wound noted. Hypergranulation removed with 312 blade and cauterized to promote healing  Procedure: Chemical Cauterization of Granulation Tissue Rationale: Cauterize granular wound base to promote healing.  Wound Measurements: 3.5 cm x 1.5 cm x -0.1 cm  Instrumentation: Silver nitrate stick x6 Dressing: Dry, sterile, compression dressing. Disposition: Patient tolerated procedure well. Patient to return in 1 week for follow-up.  L Foot superficial  ulcerations -Dorsal interphalangeal wounds noted. Patient would benefit from extra-depth shoes for prevention. Silvadene and DSD applied.  Follow-up next week

## 2018-02-01 DIAGNOSIS — G6181 Chronic inflammatory demyelinating polyneuritis: Secondary | ICD-10-CM | POA: Diagnosis not present

## 2018-02-01 DIAGNOSIS — R29898 Other symptoms and signs involving the musculoskeletal system: Secondary | ICD-10-CM | POA: Diagnosis not present

## 2018-02-04 DIAGNOSIS — I5022 Chronic systolic (congestive) heart failure: Secondary | ICD-10-CM | POA: Diagnosis not present

## 2018-02-04 DIAGNOSIS — I110 Hypertensive heart disease with heart failure: Secondary | ICD-10-CM | POA: Diagnosis not present

## 2018-02-04 DIAGNOSIS — F319 Bipolar disorder, unspecified: Secondary | ICD-10-CM | POA: Diagnosis not present

## 2018-02-04 DIAGNOSIS — M86071 Acute hematogenous osteomyelitis, right ankle and foot: Secondary | ICD-10-CM | POA: Diagnosis not present

## 2018-02-04 DIAGNOSIS — Z4781 Encounter for orthopedic aftercare following surgical amputation: Secondary | ICD-10-CM | POA: Diagnosis not present

## 2018-02-04 DIAGNOSIS — L89893 Pressure ulcer of other site, stage 3: Secondary | ICD-10-CM | POA: Diagnosis not present

## 2018-02-04 DIAGNOSIS — I480 Paroxysmal atrial fibrillation: Secondary | ICD-10-CM | POA: Diagnosis not present

## 2018-02-04 DIAGNOSIS — E1169 Type 2 diabetes mellitus with other specified complication: Secondary | ICD-10-CM | POA: Diagnosis not present

## 2018-02-04 DIAGNOSIS — G6181 Chronic inflammatory demyelinating polyneuritis: Secondary | ICD-10-CM | POA: Diagnosis not present

## 2018-02-05 ENCOUNTER — Ambulatory Visit (INDEPENDENT_AMBULATORY_CARE_PROVIDER_SITE_OTHER): Payer: Medicare HMO | Admitting: Podiatry

## 2018-02-05 DIAGNOSIS — L928 Other granulomatous disorders of the skin and subcutaneous tissue: Secondary | ICD-10-CM

## 2018-02-05 DIAGNOSIS — L97521 Non-pressure chronic ulcer of other part of left foot limited to breakdown of skin: Secondary | ICD-10-CM

## 2018-02-06 DIAGNOSIS — G910 Communicating hydrocephalus: Secondary | ICD-10-CM | POA: Diagnosis not present

## 2018-02-06 DIAGNOSIS — Z4541 Encounter for adjustment and management of cerebrospinal fluid drainage device: Secondary | ICD-10-CM | POA: Diagnosis not present

## 2018-02-06 DIAGNOSIS — D333 Benign neoplasm of cranial nerves: Secondary | ICD-10-CM | POA: Diagnosis not present

## 2018-02-07 DIAGNOSIS — F319 Bipolar disorder, unspecified: Secondary | ICD-10-CM | POA: Diagnosis not present

## 2018-02-07 DIAGNOSIS — I5022 Chronic systolic (congestive) heart failure: Secondary | ICD-10-CM | POA: Diagnosis not present

## 2018-02-07 DIAGNOSIS — E1169 Type 2 diabetes mellitus with other specified complication: Secondary | ICD-10-CM | POA: Diagnosis not present

## 2018-02-07 DIAGNOSIS — I480 Paroxysmal atrial fibrillation: Secondary | ICD-10-CM | POA: Diagnosis not present

## 2018-02-07 DIAGNOSIS — L89893 Pressure ulcer of other site, stage 3: Secondary | ICD-10-CM | POA: Diagnosis not present

## 2018-02-07 DIAGNOSIS — Z4781 Encounter for orthopedic aftercare following surgical amputation: Secondary | ICD-10-CM | POA: Diagnosis not present

## 2018-02-07 DIAGNOSIS — I110 Hypertensive heart disease with heart failure: Secondary | ICD-10-CM | POA: Diagnosis not present

## 2018-02-07 DIAGNOSIS — G6181 Chronic inflammatory demyelinating polyneuritis: Secondary | ICD-10-CM | POA: Diagnosis not present

## 2018-02-07 DIAGNOSIS — M86071 Acute hematogenous osteomyelitis, right ankle and foot: Secondary | ICD-10-CM | POA: Diagnosis not present

## 2018-02-11 DIAGNOSIS — M8608 Acute hematogenous osteomyelitis, other sites: Secondary | ICD-10-CM | POA: Diagnosis not present

## 2018-02-11 DIAGNOSIS — I110 Hypertensive heart disease with heart failure: Secondary | ICD-10-CM | POA: Diagnosis not present

## 2018-02-11 DIAGNOSIS — E1121 Type 2 diabetes mellitus with diabetic nephropathy: Secondary | ICD-10-CM | POA: Diagnosis not present

## 2018-02-11 DIAGNOSIS — L89893 Pressure ulcer of other site, stage 3: Secondary | ICD-10-CM | POA: Diagnosis not present

## 2018-02-11 DIAGNOSIS — I480 Paroxysmal atrial fibrillation: Secondary | ICD-10-CM | POA: Diagnosis not present

## 2018-02-11 DIAGNOSIS — F5221 Male erectile disorder: Secondary | ICD-10-CM | POA: Diagnosis not present

## 2018-02-11 DIAGNOSIS — E782 Mixed hyperlipidemia: Secondary | ICD-10-CM | POA: Diagnosis not present

## 2018-02-11 DIAGNOSIS — F319 Bipolar disorder, unspecified: Secondary | ICD-10-CM | POA: Diagnosis not present

## 2018-02-11 DIAGNOSIS — I482 Chronic atrial fibrillation: Secondary | ICD-10-CM | POA: Diagnosis not present

## 2018-02-11 DIAGNOSIS — E1169 Type 2 diabetes mellitus with other specified complication: Secondary | ICD-10-CM | POA: Diagnosis not present

## 2018-02-11 DIAGNOSIS — Z4781 Encounter for orthopedic aftercare following surgical amputation: Secondary | ICD-10-CM | POA: Diagnosis not present

## 2018-02-11 DIAGNOSIS — I5022 Chronic systolic (congestive) heart failure: Secondary | ICD-10-CM | POA: Diagnosis not present

## 2018-02-11 DIAGNOSIS — G894 Chronic pain syndrome: Secondary | ICD-10-CM | POA: Diagnosis not present

## 2018-02-11 DIAGNOSIS — M545 Low back pain: Secondary | ICD-10-CM | POA: Diagnosis not present

## 2018-02-11 DIAGNOSIS — M86071 Acute hematogenous osteomyelitis, right ankle and foot: Secondary | ICD-10-CM | POA: Diagnosis not present

## 2018-02-11 DIAGNOSIS — G6181 Chronic inflammatory demyelinating polyneuritis: Secondary | ICD-10-CM | POA: Diagnosis not present

## 2018-02-11 DIAGNOSIS — F330 Major depressive disorder, recurrent, mild: Secondary | ICD-10-CM | POA: Diagnosis not present

## 2018-02-12 ENCOUNTER — Ambulatory Visit (INDEPENDENT_AMBULATORY_CARE_PROVIDER_SITE_OTHER): Payer: Medicare HMO | Admitting: Podiatry

## 2018-02-12 DIAGNOSIS — E1140 Type 2 diabetes mellitus with diabetic neuropathy, unspecified: Secondary | ICD-10-CM | POA: Diagnosis not present

## 2018-02-12 DIAGNOSIS — S7000XA Contusion of unspecified hip, initial encounter: Secondary | ICD-10-CM | POA: Diagnosis not present

## 2018-02-12 DIAGNOSIS — J449 Chronic obstructive pulmonary disease, unspecified: Secondary | ICD-10-CM | POA: Diagnosis not present

## 2018-02-12 DIAGNOSIS — I739 Peripheral vascular disease, unspecified: Secondary | ICD-10-CM | POA: Diagnosis not present

## 2018-02-12 DIAGNOSIS — L928 Other granulomatous disorders of the skin and subcutaneous tissue: Secondary | ICD-10-CM

## 2018-02-12 DIAGNOSIS — G894 Chronic pain syndrome: Secondary | ICD-10-CM | POA: Diagnosis not present

## 2018-02-12 DIAGNOSIS — M5136 Other intervertebral disc degeneration, lumbar region: Secondary | ICD-10-CM | POA: Diagnosis not present

## 2018-02-12 NOTE — Progress Notes (Signed)
  Subjective:  Patient ID: Johnathan Wright, male    DOB: 10-Sep-1946,  MRN: [id number]  Chief Complaint  Patient presents with  . granulomatous    F/U Rt foot Pt. stated," foot is doing okay, no pain at all today." Tx: dressing change and silvadene    DOS: 11/20/17 Procedure: Right first metatarsal resection with filleted toe flap.  72 y.o. male returns for wound care.  Doing ok. No pain. No issues. Believe toe is healing well.  Objective:  There were no vitals filed for this visit. General AA&O x3. Normal mood and affect.  Vascular Foot warm to touch.  Neurologic Sensation grossly diminished.  Dermatologic (Wound) R hallux flap healed. Remaining open dehiscence 1x5 with fibrogranular base.  Right fourth toe distal tip ulceration with hypergranular base.  L foot DIPJ abrasion L hallux, PIPJ 2nd, 3rd.  All superficial with epithelalization.  Orthopedic: Right great toe amputation noted. Right fourth toe rectus. Right second toe rectus   Assessment & Plan:  Patient was evaluated and treated and all questions answered.  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  Procedure: Chemical Cauterization of Granulation Tissue Rationale: Cauterize granular wound base to promote healing.  Wound Measurements: 5 cm x 1 cm x 0.1 cm  Instrumentation: Silver nitrate stick x3 Dressing: Dry, sterile, compression dressing. Disposition: Patient tolerated procedure well. Patient to return in 1 week for follow-up.   Follow-up next week

## 2018-02-14 DIAGNOSIS — G6181 Chronic inflammatory demyelinating polyneuritis: Secondary | ICD-10-CM | POA: Diagnosis not present

## 2018-02-14 DIAGNOSIS — M86071 Acute hematogenous osteomyelitis, right ankle and foot: Secondary | ICD-10-CM | POA: Diagnosis not present

## 2018-02-14 DIAGNOSIS — I5022 Chronic systolic (congestive) heart failure: Secondary | ICD-10-CM | POA: Diagnosis not present

## 2018-02-14 DIAGNOSIS — I110 Hypertensive heart disease with heart failure: Secondary | ICD-10-CM | POA: Diagnosis not present

## 2018-02-14 DIAGNOSIS — I480 Paroxysmal atrial fibrillation: Secondary | ICD-10-CM | POA: Diagnosis not present

## 2018-02-14 DIAGNOSIS — E1169 Type 2 diabetes mellitus with other specified complication: Secondary | ICD-10-CM | POA: Diagnosis not present

## 2018-02-14 DIAGNOSIS — F319 Bipolar disorder, unspecified: Secondary | ICD-10-CM | POA: Diagnosis not present

## 2018-02-14 DIAGNOSIS — Z4781 Encounter for orthopedic aftercare following surgical amputation: Secondary | ICD-10-CM | POA: Diagnosis not present

## 2018-02-14 DIAGNOSIS — L89893 Pressure ulcer of other site, stage 3: Secondary | ICD-10-CM | POA: Diagnosis not present

## 2018-02-18 NOTE — Progress Notes (Signed)
  Subjective:  Patient ID: Johnathan Wright, male    DOB: 1946-06-11,  MRN: [id number]  Chief Complaint  Patient presents with  . Granulomatous    F/U granulomatous Pt. stated," it's doing better, no pain at all." Tx: silvadene    DOS: 11/20/17 Procedure: Right first metatarsal resection with filleted toe flap.  72 y.o. male returns for wound care.  States the wound is doing better.  Denies pain.  Reports light red drainage  Objective:  There were no vitals filed for this visit. General AA&O x3. Normal mood and affect.  Vascular Foot warm to touch.  Neurologic Sensation grossly diminished.  Dermatologic (Wound) R hallux flap viable. Remaining open dehiscence with hypergranulation.  Right fourth toe distal tip ulceration healed.  L foot DIPJ abrasion L hallux, PIPJ 2nd, 3rd.  All superficial  Orthopedic: Right great toe amputation noted. Right fourth toe rectus. Right second toe rectus   Assessment & Plan:  Patient was evaluated and treated and all questions answered.  Status post right partial first ray amputation with filleted toe flap -Hyper granular wound noted. Hypergranulation removed with 312 blade and cauterized to promote healing. Covered under global. -Dressed with DSD.

## 2018-02-19 ENCOUNTER — Ambulatory Visit (INDEPENDENT_AMBULATORY_CARE_PROVIDER_SITE_OTHER): Payer: Medicare HMO | Admitting: Podiatry

## 2018-02-19 DIAGNOSIS — L928 Other granulomatous disorders of the skin and subcutaneous tissue: Secondary | ICD-10-CM

## 2018-02-19 NOTE — Progress Notes (Signed)
  Subjective:  Patient ID: Johnathan Wright, male    DOB: 1946/02/23,  MRN: [id number]  No chief complaint on file.  DOS: 11/20/17 Procedure: Right first metatarsal resection with filleted toe flap.  72 y.o. male returns for wound care.  Doing ok. No pain. No issues. States the wound is doing well. HHC applying silvadene.  Objective:  There were no vitals filed for this visit. General AA&O x3. Normal mood and affect.  Vascular Foot warm to touch.  Neurologic Sensation grossly diminished.  Dermatologic (Wound) R hallux flap healed. Remaining open dehiscence 3x1 proximally, 1x1 distally.   Orthopedic: Right great toe amputation noted. Right fourth toe rectus. Right second toe rectus   Assessment & Plan:  Patient was evaluated and treated and all questions answered.  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  Procedure: Chemical Cauterization of Granulation Tissue Rationale: Cauterize granular wound base to promote healing.  Wound Measurements: 3 cm x 1 cm x 0.1 cm, 1x1 Instrumentation: Silver nitrate stick x5 Dressing: Dry, sterile, compression dressing. Disposition: Patient tolerated procedure well. Patient to return in 1 week for follow-up.   Follow-up next week

## 2018-02-20 DIAGNOSIS — S7000XA Contusion of unspecified hip, initial encounter: Secondary | ICD-10-CM | POA: Diagnosis not present

## 2018-02-20 DIAGNOSIS — M5136 Other intervertebral disc degeneration, lumbar region: Secondary | ICD-10-CM | POA: Diagnosis not present

## 2018-02-20 DIAGNOSIS — E1140 Type 2 diabetes mellitus with diabetic neuropathy, unspecified: Secondary | ICD-10-CM | POA: Diagnosis not present

## 2018-02-20 DIAGNOSIS — J449 Chronic obstructive pulmonary disease, unspecified: Secondary | ICD-10-CM | POA: Diagnosis not present

## 2018-02-20 DIAGNOSIS — I739 Peripheral vascular disease, unspecified: Secondary | ICD-10-CM | POA: Diagnosis not present

## 2018-02-20 DIAGNOSIS — G894 Chronic pain syndrome: Secondary | ICD-10-CM | POA: Diagnosis not present

## 2018-02-21 DIAGNOSIS — G6181 Chronic inflammatory demyelinating polyneuritis: Secondary | ICD-10-CM | POA: Diagnosis not present

## 2018-02-21 DIAGNOSIS — L89893 Pressure ulcer of other site, stage 3: Secondary | ICD-10-CM | POA: Diagnosis not present

## 2018-02-21 DIAGNOSIS — E1169 Type 2 diabetes mellitus with other specified complication: Secondary | ICD-10-CM | POA: Diagnosis not present

## 2018-02-21 DIAGNOSIS — I110 Hypertensive heart disease with heart failure: Secondary | ICD-10-CM | POA: Diagnosis not present

## 2018-02-21 DIAGNOSIS — M86071 Acute hematogenous osteomyelitis, right ankle and foot: Secondary | ICD-10-CM | POA: Diagnosis not present

## 2018-02-21 DIAGNOSIS — F319 Bipolar disorder, unspecified: Secondary | ICD-10-CM | POA: Diagnosis not present

## 2018-02-21 DIAGNOSIS — I5022 Chronic systolic (congestive) heart failure: Secondary | ICD-10-CM | POA: Diagnosis not present

## 2018-02-21 DIAGNOSIS — Z4781 Encounter for orthopedic aftercare following surgical amputation: Secondary | ICD-10-CM | POA: Diagnosis not present

## 2018-02-21 DIAGNOSIS — I480 Paroxysmal atrial fibrillation: Secondary | ICD-10-CM | POA: Diagnosis not present

## 2018-02-26 ENCOUNTER — Ambulatory Visit (INDEPENDENT_AMBULATORY_CARE_PROVIDER_SITE_OTHER): Payer: Medicare HMO | Admitting: Podiatry

## 2018-02-26 ENCOUNTER — Encounter: Payer: Self-pay | Admitting: Podiatry

## 2018-02-26 DIAGNOSIS — L928 Other granulomatous disorders of the skin and subcutaneous tissue: Secondary | ICD-10-CM | POA: Diagnosis not present

## 2018-02-26 DIAGNOSIS — L97521 Non-pressure chronic ulcer of other part of left foot limited to breakdown of skin: Secondary | ICD-10-CM

## 2018-02-26 NOTE — Progress Notes (Signed)
  Subjective:  Patient ID: Johnathan Wright, male    DOB: [birth year],  MRN: [id number]  Chief Complaint  Patient presents with  . granulomatous    F/U granulomatous disorders of the skin Pt. stated," it's doing much better." Tx: silvadene   DOS: 11/20/17 Procedure: Right first metatarsal resection with filleted toe flap.  72 y.o. male returns for wound care. Wound improving. No pain. Thinks it is looking better. No new issues.  Objective:  There were no vitals filed for this visit. General AA&O x3. Normal mood and affect.  Vascular Foot warm to touch.  Neurologic Sensation grossly diminished.  Dermatologic (Wound) R hallux flap healed. Remaining open dehiscence 1x0.3 proximally, 2x0.3 distally.   Orthopedic: Right great toe amputation noted. Right fourth toe rectus. Right second toe rectus   Assessment & Plan:  Patient was evaluated and treated and all questions answered.  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 -Almost healed.  Procedure: Selective Debridement of Wound Rationale: Removal of devitalized tissue from the wound to promote healing.  Pre-Debridement Wound Measurements: 1 cm x 0.3 cm x 0.1 cm, 2x0.3x0.1  Post-Debridement Wound Measurements: same as pre-debridement. Type of Debridement: Selective Tissue Removed: Devitalized soft-tissue Instrumentation: 3-0 mm dermal curette Dressing: Dry, sterile, compression dressing. Disposition: Patient tolerated procedure well. Patient to return in 1 week for follow-up.  Procedure: Chemical Cauterization of Granulation Tissue Rationale: Cauterize granular wound base to promote healing.  Wound Measurements: 1 cm x 0.3 cm x 0.1 cm, 2x0.3x0.1 Instrumentation: Silver nitrate stick x3 Dressing: Dry, sterile, compression dressing. Disposition: Patient tolerated procedure well. Patient to return in 1 week for follow-up.  Follow-up next week

## 2018-02-28 DIAGNOSIS — M86071 Acute hematogenous osteomyelitis, right ankle and foot: Secondary | ICD-10-CM | POA: Diagnosis not present

## 2018-02-28 DIAGNOSIS — I110 Hypertensive heart disease with heart failure: Secondary | ICD-10-CM | POA: Diagnosis not present

## 2018-02-28 DIAGNOSIS — L89893 Pressure ulcer of other site, stage 3: Secondary | ICD-10-CM | POA: Diagnosis not present

## 2018-02-28 DIAGNOSIS — I480 Paroxysmal atrial fibrillation: Secondary | ICD-10-CM | POA: Diagnosis not present

## 2018-02-28 DIAGNOSIS — I5022 Chronic systolic (congestive) heart failure: Secondary | ICD-10-CM | POA: Diagnosis not present

## 2018-02-28 DIAGNOSIS — E1169 Type 2 diabetes mellitus with other specified complication: Secondary | ICD-10-CM | POA: Diagnosis not present

## 2018-02-28 DIAGNOSIS — G6181 Chronic inflammatory demyelinating polyneuritis: Secondary | ICD-10-CM | POA: Diagnosis not present

## 2018-02-28 DIAGNOSIS — Z4781 Encounter for orthopedic aftercare following surgical amputation: Secondary | ICD-10-CM | POA: Diagnosis not present

## 2018-02-28 DIAGNOSIS — F319 Bipolar disorder, unspecified: Secondary | ICD-10-CM | POA: Diagnosis not present

## 2018-03-05 ENCOUNTER — Ambulatory Visit (INDEPENDENT_AMBULATORY_CARE_PROVIDER_SITE_OTHER): Payer: Medicare HMO | Admitting: Podiatry

## 2018-03-05 DIAGNOSIS — F319 Bipolar disorder, unspecified: Secondary | ICD-10-CM | POA: Diagnosis not present

## 2018-03-05 DIAGNOSIS — E1169 Type 2 diabetes mellitus with other specified complication: Secondary | ICD-10-CM | POA: Diagnosis not present

## 2018-03-05 DIAGNOSIS — M86071 Acute hematogenous osteomyelitis, right ankle and foot: Secondary | ICD-10-CM | POA: Diagnosis not present

## 2018-03-05 DIAGNOSIS — G4733 Obstructive sleep apnea (adult) (pediatric): Secondary | ICD-10-CM | POA: Diagnosis not present

## 2018-03-05 DIAGNOSIS — G6181 Chronic inflammatory demyelinating polyneuritis: Secondary | ICD-10-CM | POA: Diagnosis not present

## 2018-03-05 DIAGNOSIS — Z4781 Encounter for orthopedic aftercare following surgical amputation: Secondary | ICD-10-CM | POA: Diagnosis not present

## 2018-03-05 DIAGNOSIS — I480 Paroxysmal atrial fibrillation: Secondary | ICD-10-CM | POA: Diagnosis not present

## 2018-03-05 DIAGNOSIS — L97521 Non-pressure chronic ulcer of other part of left foot limited to breakdown of skin: Secondary | ICD-10-CM | POA: Diagnosis not present

## 2018-03-05 DIAGNOSIS — I110 Hypertensive heart disease with heart failure: Secondary | ICD-10-CM | POA: Diagnosis not present

## 2018-03-05 DIAGNOSIS — I5022 Chronic systolic (congestive) heart failure: Secondary | ICD-10-CM | POA: Diagnosis not present

## 2018-03-06 DIAGNOSIS — S7000XA Contusion of unspecified hip, initial encounter: Secondary | ICD-10-CM | POA: Diagnosis not present

## 2018-03-06 DIAGNOSIS — E1140 Type 2 diabetes mellitus with diabetic neuropathy, unspecified: Secondary | ICD-10-CM | POA: Diagnosis not present

## 2018-03-06 DIAGNOSIS — G894 Chronic pain syndrome: Secondary | ICD-10-CM | POA: Diagnosis not present

## 2018-03-06 DIAGNOSIS — M5136 Other intervertebral disc degeneration, lumbar region: Secondary | ICD-10-CM | POA: Diagnosis not present

## 2018-03-06 DIAGNOSIS — I739 Peripheral vascular disease, unspecified: Secondary | ICD-10-CM | POA: Diagnosis not present

## 2018-03-06 DIAGNOSIS — J449 Chronic obstructive pulmonary disease, unspecified: Secondary | ICD-10-CM | POA: Diagnosis not present

## 2018-03-07 DIAGNOSIS — I110 Hypertensive heart disease with heart failure: Secondary | ICD-10-CM | POA: Diagnosis not present

## 2018-03-07 DIAGNOSIS — I5022 Chronic systolic (congestive) heart failure: Secondary | ICD-10-CM | POA: Diagnosis not present

## 2018-03-07 DIAGNOSIS — Z4781 Encounter for orthopedic aftercare following surgical amputation: Secondary | ICD-10-CM | POA: Diagnosis not present

## 2018-03-07 DIAGNOSIS — I480 Paroxysmal atrial fibrillation: Secondary | ICD-10-CM | POA: Diagnosis not present

## 2018-03-07 DIAGNOSIS — G6181 Chronic inflammatory demyelinating polyneuritis: Secondary | ICD-10-CM | POA: Diagnosis not present

## 2018-03-07 DIAGNOSIS — F319 Bipolar disorder, unspecified: Secondary | ICD-10-CM | POA: Diagnosis not present

## 2018-03-07 DIAGNOSIS — M86071 Acute hematogenous osteomyelitis, right ankle and foot: Secondary | ICD-10-CM | POA: Diagnosis not present

## 2018-03-07 DIAGNOSIS — G4733 Obstructive sleep apnea (adult) (pediatric): Secondary | ICD-10-CM | POA: Diagnosis not present

## 2018-03-07 DIAGNOSIS — E1169 Type 2 diabetes mellitus with other specified complication: Secondary | ICD-10-CM | POA: Diagnosis not present

## 2018-03-08 DIAGNOSIS — M4802 Spinal stenosis, cervical region: Secondary | ICD-10-CM | POA: Diagnosis not present

## 2018-03-08 DIAGNOSIS — M4712 Other spondylosis with myelopathy, cervical region: Secondary | ICD-10-CM | POA: Diagnosis not present

## 2018-03-08 DIAGNOSIS — M47812 Spondylosis without myelopathy or radiculopathy, cervical region: Secondary | ICD-10-CM | POA: Diagnosis not present

## 2018-03-12 ENCOUNTER — Ambulatory Visit (INDEPENDENT_AMBULATORY_CARE_PROVIDER_SITE_OTHER): Payer: Medicare HMO | Admitting: Podiatry

## 2018-03-12 VITALS — BP 110/70 | HR 98 | Temp 97.2°F

## 2018-03-12 DIAGNOSIS — S98111A Complete traumatic amputation of right great toe, initial encounter: Secondary | ICD-10-CM | POA: Diagnosis not present

## 2018-03-12 DIAGNOSIS — L97521 Non-pressure chronic ulcer of other part of left foot limited to breakdown of skin: Secondary | ICD-10-CM

## 2018-03-12 DIAGNOSIS — L928 Other granulomatous disorders of the skin and subcutaneous tissue: Secondary | ICD-10-CM

## 2018-03-12 DIAGNOSIS — B351 Tinea unguium: Secondary | ICD-10-CM

## 2018-03-12 DIAGNOSIS — IMO0002 Reserved for concepts with insufficient information to code with codable children: Secondary | ICD-10-CM

## 2018-03-12 NOTE — Progress Notes (Signed)
  Subjective:  Patient ID: Johnathan Wright, male    DOB: 11-Jul-1946,  MRN: [id number]  Chief Complaint  Patient presents with  . Foot Ulcer    F/U R ulcer Pt. stated," it's healing up pretty good, no concerncs." Tx: silvadene  . debride    B/L nail trimming   DOS: 11/20/17 Procedure: Right first metatarsal resection with filleted toe flap.  72 y.o. male returns for wound care. Doing well believes wound almost healed.  Objective:   Vitals:   03/12/18 1355  BP: 110/70  Pulse: 98  Temp: (!) 97.2 F (36.2 C)   General AA&O x3. Normal mood and affect.  Vascular Foot warm to touch.  Neurologic Sensation grossly diminished.  Dermatologic (Wound) R hallux flap healed. Remaining open dehiscence 0.5x0.2 proximally, 1x0.2 distally.   Orthopedic: Right great toe amputation noted. Right fourth toe rectus. Right second toe rectus   Assessment & Plan:  Patient was evaluated and treated and all questions answered.  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 -Almost healed.  Procedure: Selective Debridement of Wound Rationale: Removal of devitalized tissue from the wound to promote healing.  Pre-Debridement Wound Measurements:  0.5x0.2 proximally, 1x0.2 distally. Post-Debridement Wound Measurements: same as pre-debridement. Type of Debridement: Selective Tissue Removed: Devitalized soft-tissue Instrumentation: 3-0 mm dermal curette Dressing: Dry, sterile, compression dressing. Disposition: Patient tolerated procedure well. Patient to return in 1 week for follow-up.  Procedure: Chemical Cauterization of Granulation Tissue Rationale: Cauterize granular wound base to promote healing.  Wound Measurements:  0.5x0.2 proximally, 1x0.2 distally.  Instrumentation: Silver nitrate stick x3 Dressing: Dry, sterile, compression dressing. Disposition: Patient tolerated procedure well. Patient to return in 1 week for  follow-up.  Onychomycosis with Amputation Hx -Nails debrided x9  Procedure: Nail Debridement Rationale: Patient meets criteria for routine foot care due to Class A findings. Type of Debridement: manual, sharp debridement. Instrumentation: Nail nipper, rotary burr. Number of Nails: 9

## 2018-03-14 DIAGNOSIS — Z4781 Encounter for orthopedic aftercare following surgical amputation: Secondary | ICD-10-CM | POA: Diagnosis not present

## 2018-03-14 DIAGNOSIS — F319 Bipolar disorder, unspecified: Secondary | ICD-10-CM | POA: Diagnosis not present

## 2018-03-14 DIAGNOSIS — G6181 Chronic inflammatory demyelinating polyneuritis: Secondary | ICD-10-CM | POA: Diagnosis not present

## 2018-03-14 DIAGNOSIS — E1169 Type 2 diabetes mellitus with other specified complication: Secondary | ICD-10-CM | POA: Diagnosis not present

## 2018-03-14 DIAGNOSIS — M86071 Acute hematogenous osteomyelitis, right ankle and foot: Secondary | ICD-10-CM | POA: Diagnosis not present

## 2018-03-14 DIAGNOSIS — I480 Paroxysmal atrial fibrillation: Secondary | ICD-10-CM | POA: Diagnosis not present

## 2018-03-14 DIAGNOSIS — G4733 Obstructive sleep apnea (adult) (pediatric): Secondary | ICD-10-CM | POA: Diagnosis not present

## 2018-03-14 DIAGNOSIS — I5022 Chronic systolic (congestive) heart failure: Secondary | ICD-10-CM | POA: Diagnosis not present

## 2018-03-14 DIAGNOSIS — I110 Hypertensive heart disease with heart failure: Secondary | ICD-10-CM | POA: Diagnosis not present

## 2018-03-21 ENCOUNTER — Telehealth: Payer: Self-pay | Admitting: *Deleted

## 2018-03-21 DIAGNOSIS — G6181 Chronic inflammatory demyelinating polyneuritis: Secondary | ICD-10-CM | POA: Diagnosis not present

## 2018-03-21 DIAGNOSIS — Z4781 Encounter for orthopedic aftercare following surgical amputation: Secondary | ICD-10-CM | POA: Diagnosis not present

## 2018-03-21 DIAGNOSIS — I480 Paroxysmal atrial fibrillation: Secondary | ICD-10-CM | POA: Diagnosis not present

## 2018-03-21 DIAGNOSIS — I5022 Chronic systolic (congestive) heart failure: Secondary | ICD-10-CM | POA: Diagnosis not present

## 2018-03-21 DIAGNOSIS — E1169 Type 2 diabetes mellitus with other specified complication: Secondary | ICD-10-CM | POA: Diagnosis not present

## 2018-03-21 DIAGNOSIS — F319 Bipolar disorder, unspecified: Secondary | ICD-10-CM | POA: Diagnosis not present

## 2018-03-21 DIAGNOSIS — M86071 Acute hematogenous osteomyelitis, right ankle and foot: Secondary | ICD-10-CM | POA: Diagnosis not present

## 2018-03-21 DIAGNOSIS — G4733 Obstructive sleep apnea (adult) (pediatric): Secondary | ICD-10-CM | POA: Diagnosis not present

## 2018-03-21 DIAGNOSIS — I110 Hypertensive heart disease with heart failure: Secondary | ICD-10-CM | POA: Diagnosis not present

## 2018-03-21 NOTE — Telephone Encounter (Signed)
Johnathan Wright - [facility] states pt has 3 new minute wounds to the right foot scar area, and right 2nd toe, she has applied medihoney pt had on hand to the sites and has instructed pt's wife to dress daily with the medihoney until seen in office by Dr. March Rummage next week. I told Johnathan Wright, to continue those orders until I called again if Dr. March Rummage changed.

## 2018-03-22 NOTE — Telephone Encounter (Signed)
Left message informing Ivin Booty, nurse - [facility] that Dr. March Rummage had agreed to the current wound care orders.

## 2018-03-22 NOTE — Telephone Encounter (Signed)
Agree with plan. Thanks

## 2018-03-26 ENCOUNTER — Ambulatory Visit (INDEPENDENT_AMBULATORY_CARE_PROVIDER_SITE_OTHER): Payer: Medicare HMO | Admitting: Podiatry

## 2018-03-26 DIAGNOSIS — L97521 Non-pressure chronic ulcer of other part of left foot limited to breakdown of skin: Secondary | ICD-10-CM | POA: Diagnosis not present

## 2018-03-27 DIAGNOSIS — E1140 Type 2 diabetes mellitus with diabetic neuropathy, unspecified: Secondary | ICD-10-CM | POA: Diagnosis not present

## 2018-03-27 DIAGNOSIS — M5136 Other intervertebral disc degeneration, lumbar region: Secondary | ICD-10-CM | POA: Diagnosis not present

## 2018-03-27 DIAGNOSIS — I739 Peripheral vascular disease, unspecified: Secondary | ICD-10-CM | POA: Diagnosis not present

## 2018-03-27 DIAGNOSIS — S7000XA Contusion of unspecified hip, initial encounter: Secondary | ICD-10-CM | POA: Diagnosis not present

## 2018-03-27 DIAGNOSIS — G894 Chronic pain syndrome: Secondary | ICD-10-CM | POA: Diagnosis not present

## 2018-03-27 DIAGNOSIS — J449 Chronic obstructive pulmonary disease, unspecified: Secondary | ICD-10-CM | POA: Diagnosis not present

## 2018-03-28 DIAGNOSIS — F319 Bipolar disorder, unspecified: Secondary | ICD-10-CM | POA: Diagnosis not present

## 2018-03-28 DIAGNOSIS — G4733 Obstructive sleep apnea (adult) (pediatric): Secondary | ICD-10-CM | POA: Diagnosis not present

## 2018-03-28 DIAGNOSIS — I110 Hypertensive heart disease with heart failure: Secondary | ICD-10-CM | POA: Diagnosis not present

## 2018-03-28 DIAGNOSIS — G6181 Chronic inflammatory demyelinating polyneuritis: Secondary | ICD-10-CM | POA: Diagnosis not present

## 2018-03-28 DIAGNOSIS — M86071 Acute hematogenous osteomyelitis, right ankle and foot: Secondary | ICD-10-CM | POA: Diagnosis not present

## 2018-03-28 DIAGNOSIS — I480 Paroxysmal atrial fibrillation: Secondary | ICD-10-CM | POA: Diagnosis not present

## 2018-03-28 DIAGNOSIS — Z4781 Encounter for orthopedic aftercare following surgical amputation: Secondary | ICD-10-CM | POA: Diagnosis not present

## 2018-03-28 DIAGNOSIS — I5022 Chronic systolic (congestive) heart failure: Secondary | ICD-10-CM | POA: Diagnosis not present

## 2018-03-28 DIAGNOSIS — E1169 Type 2 diabetes mellitus with other specified complication: Secondary | ICD-10-CM | POA: Diagnosis not present

## 2018-04-03 DIAGNOSIS — I5022 Chronic systolic (congestive) heart failure: Secondary | ICD-10-CM | POA: Diagnosis not present

## 2018-04-03 DIAGNOSIS — F319 Bipolar disorder, unspecified: Secondary | ICD-10-CM | POA: Diagnosis not present

## 2018-04-03 DIAGNOSIS — G6181 Chronic inflammatory demyelinating polyneuritis: Secondary | ICD-10-CM | POA: Diagnosis not present

## 2018-04-03 DIAGNOSIS — Z4781 Encounter for orthopedic aftercare following surgical amputation: Secondary | ICD-10-CM | POA: Diagnosis not present

## 2018-04-03 DIAGNOSIS — I480 Paroxysmal atrial fibrillation: Secondary | ICD-10-CM | POA: Diagnosis not present

## 2018-04-03 DIAGNOSIS — G4733 Obstructive sleep apnea (adult) (pediatric): Secondary | ICD-10-CM | POA: Diagnosis not present

## 2018-04-03 DIAGNOSIS — M86071 Acute hematogenous osteomyelitis, right ankle and foot: Secondary | ICD-10-CM | POA: Diagnosis not present

## 2018-04-03 DIAGNOSIS — I110 Hypertensive heart disease with heart failure: Secondary | ICD-10-CM | POA: Diagnosis not present

## 2018-04-03 DIAGNOSIS — E1169 Type 2 diabetes mellitus with other specified complication: Secondary | ICD-10-CM | POA: Diagnosis not present

## 2018-04-09 ENCOUNTER — Ambulatory Visit (INDEPENDENT_AMBULATORY_CARE_PROVIDER_SITE_OTHER): Payer: Medicare HMO | Admitting: Podiatry

## 2018-04-09 DIAGNOSIS — I739 Peripheral vascular disease, unspecified: Secondary | ICD-10-CM | POA: Diagnosis not present

## 2018-04-09 DIAGNOSIS — J449 Chronic obstructive pulmonary disease, unspecified: Secondary | ICD-10-CM | POA: Diagnosis not present

## 2018-04-09 DIAGNOSIS — S7000XA Contusion of unspecified hip, initial encounter: Secondary | ICD-10-CM | POA: Diagnosis not present

## 2018-04-09 DIAGNOSIS — M5136 Other intervertebral disc degeneration, lumbar region: Secondary | ICD-10-CM | POA: Diagnosis not present

## 2018-04-09 DIAGNOSIS — M461 Sacroiliitis, not elsewhere classified: Secondary | ICD-10-CM | POA: Diagnosis not present

## 2018-04-09 DIAGNOSIS — L97521 Non-pressure chronic ulcer of other part of left foot limited to breakdown of skin: Secondary | ICD-10-CM | POA: Diagnosis not present

## 2018-04-09 DIAGNOSIS — E1140 Type 2 diabetes mellitus with diabetic neuropathy, unspecified: Secondary | ICD-10-CM | POA: Diagnosis not present

## 2018-04-09 DIAGNOSIS — G894 Chronic pain syndrome: Secondary | ICD-10-CM | POA: Diagnosis not present

## 2018-04-11 ENCOUNTER — Telehealth: Payer: Self-pay | Admitting: Podiatry

## 2018-04-11 DIAGNOSIS — M86071 Acute hematogenous osteomyelitis, right ankle and foot: Secondary | ICD-10-CM | POA: Diagnosis not present

## 2018-04-11 DIAGNOSIS — E1169 Type 2 diabetes mellitus with other specified complication: Secondary | ICD-10-CM | POA: Diagnosis not present

## 2018-04-11 DIAGNOSIS — I480 Paroxysmal atrial fibrillation: Secondary | ICD-10-CM | POA: Diagnosis not present

## 2018-04-11 DIAGNOSIS — G4733 Obstructive sleep apnea (adult) (pediatric): Secondary | ICD-10-CM | POA: Diagnosis not present

## 2018-04-11 DIAGNOSIS — F319 Bipolar disorder, unspecified: Secondary | ICD-10-CM | POA: Diagnosis not present

## 2018-04-11 DIAGNOSIS — I110 Hypertensive heart disease with heart failure: Secondary | ICD-10-CM | POA: Diagnosis not present

## 2018-04-11 DIAGNOSIS — I5022 Chronic systolic (congestive) heart failure: Secondary | ICD-10-CM | POA: Diagnosis not present

## 2018-04-11 DIAGNOSIS — G6181 Chronic inflammatory demyelinating polyneuritis: Secondary | ICD-10-CM | POA: Diagnosis not present

## 2018-04-11 DIAGNOSIS — Z4781 Encounter for orthopedic aftercare following surgical amputation: Secondary | ICD-10-CM | POA: Diagnosis not present

## 2018-04-11 NOTE — Telephone Encounter (Signed)
I told pt his [place], nurse had said his foot and leg on the surgery foot was big, red and hot, and I told pt we needed to see him in office today or tomorrow, if this worsened to go to the ED. Pt states he didn't think it would get worse.

## 2018-04-11 NOTE — Telephone Encounter (Signed)
This is Johnathan Wright with [facility]. I went out to see him as I go out weekly and his leg and foot is really red again and hot. It looks like that infection has flared back up. His second toe is really swollen. I wanted to see what Dr. March Rummage wants [place] to do. My cell phone is [phone]. Thanks. Bye.

## 2018-04-11 NOTE — Telephone Encounter (Signed)
Left message informing Johnathan Wright [facility] pt would be scheduled for today or tomorrow.

## 2018-04-12 ENCOUNTER — Encounter: Payer: Self-pay | Admitting: Sports Medicine

## 2018-04-12 ENCOUNTER — Ambulatory Visit (INDEPENDENT_AMBULATORY_CARE_PROVIDER_SITE_OTHER): Payer: Medicare HMO | Admitting: Sports Medicine

## 2018-04-12 VITALS — BP 138/86 | HR 87 | Temp 96.6°F | Resp 16

## 2018-04-12 DIAGNOSIS — I872 Venous insufficiency (chronic) (peripheral): Secondary | ICD-10-CM

## 2018-04-12 DIAGNOSIS — L03115 Cellulitis of right lower limb: Secondary | ICD-10-CM

## 2018-04-12 DIAGNOSIS — L97511 Non-pressure chronic ulcer of other part of right foot limited to breakdown of skin: Secondary | ICD-10-CM

## 2018-04-12 DIAGNOSIS — S98111A Complete traumatic amputation of right great toe, initial encounter: Secondary | ICD-10-CM

## 2018-04-12 DIAGNOSIS — IMO0002 Reserved for concepts with insufficient information to code with codable children: Secondary | ICD-10-CM

## 2018-04-12 DIAGNOSIS — R600 Localized edema: Secondary | ICD-10-CM | POA: Diagnosis not present

## 2018-04-12 MED ORDER — SULFAMETHOXAZOLE-TRIMETHOPRIM 400-80 MG PO TABS: 1.0000 | ORAL_TABLET | Freq: Two times a day (BID) | ORAL | 0 refills | Status: DC

## 2018-04-12 NOTE — Progress Notes (Signed)
Subjective: Johnathan Wright is a 72 y.o. male patient seen in office for right lower extremity swelling and redness as noted by home health nurse.  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 March Rummage.  Patient reports has home nursing who is helping with weekly dressing changes using medihoney to the wounds.  Patient denies fever/vomiting/chills/night sweats/shortness of breath/pain. Patient has no other pedal complaints at this time.  Patient is assisted by wife/significant other at this visit who reports that they stopped giving him his fluid pill because of the frequency of urination.  Patient Active Problem List   Diagnosis Date Noted  . CIDP (chronic inflammatory demyelinating polyneuropathy) ([place]) 09/13/2015  . Abnormality of gait 09/13/2015  . Acoustic neuroma ([place]) 09/13/2015   Current Outpatient Medications on File Prior to Visit  Medication Sig Dispense Refill  . allopurinol (ZYLOPRIM) 100 MG tablet Take 100 mg by mouth daily.    Marland Kitchen amoxicillin-clavulanate (AUGMENTIN) 875-125 MG tablet Take 1 tablet by mouth 2 (two) times daily. 20 tablet 0  . ARIPiprazole (ABILIFY) 2 MG tablet Take 2 mg by mouth daily.    Marland Kitchen aspirin 81 MG tablet Take 81 mg by mouth daily.    Marland Kitchen atorvastatin (LIPITOR) 10 MG tablet Take 10 mg by mouth daily.    . Bilberry 1000 MG CAPS Take by mouth.    Marland Kitchen CINNAMON PO Take by mouth daily.    . ciprofloxacin (CIPRO) 500 MG tablet Take 1 tablet (500 mg total) by mouth 2 (two) times daily. 28 tablet 0  . clindamycin (CLEOCIN) 300 MG capsule Take 1 capsule (300 mg total) by mouth 3 (three) times daily. 42 capsule 0  . clotrimazole (LOTRIMIN) 1 % cream Apply 1 application topically 2 (two) times daily.    . collagenase (SANTYL) ointment Apply 1 application topically daily. 15 g 5  . cyanocobalamin (,VITAMIN B-12,) 1000 MCG/ML injection Inject 1 mL (1,000 mcg total) into the muscle once. Vitamin B12 1 ml IM daily x 7 days  then 1 ml IM weekly x 4 weeks then 1 ml IM monthly x 1 year. 1 mL 0  . desvenlafaxine (PRISTIQ) 100 MG 24 hr tablet Take 100 mg by mouth daily.    . fentaNYL (DURAGESIC - DOSED MCG/HR) 50 MCG/HR Place 50 mcg onto the skin every 3 (three) days.    . furosemide (LASIX) 40 MG tablet Take 40 mg by mouth.    . Insulin Glargine (TOUJEO SOLOSTAR) 300 UNIT/ML SOPN Inject into the skin.    Marland Kitchen L-Arginine 500 MG CAPS Take by mouth.    . Liraglutide (VICTOZA) 18 MG/3ML SOPN Inject into the skin.    . Lutein 40 MG CAPS Take by mouth.    . Lutein 6 MG TABS Take by mouth.    . LYSINE PO Take 1,000 mg by mouth daily.    . metFORMIN (GLUCOPHAGE) 1000 MG tablet Take 1,000 mg by mouth 2 (two) times daily with a meal.    . oxycodone (OXY-IR) 5 MG capsule Take 5 mg by mouth every 4 (four) hours as needed.    . vortioxetine HBr (TRINTELLIX) 10 MG TABS Take 10 mg by mouth 1 day or 1 dose.     No current facility-administered medications on file prior to visit.    No Known Allergies  No results found for this or any previous visit (from the past 2160 hour(s)).  Objective: There were no vitals filed for this visit.  General: Patient is awake, alert, oriented x 3 and in no acute distress.  Dermatology: Skin is warm and dry bilateral with a amputation site wound is primarily healed however there are small openings at the distal amputation site that measures 1 x 0.2 cm and proximally that measures 0.5 x 0.4 cm and a new superficial wound to the anterior shin on the right that measures 1 x 1 cm limited to breakdown of skin with a granular base no localized warmth  however there is significant swelling and blanchable erythema.  There is significant superimposed venous stasis to both lower extremities.   Vascular: Pedal pulses are nonpalpable due to severe venous stasis and chronic trophic skin changes and edema  Neurologic: Protective sensation diminished bilateral.  Musculosketal: There is no pain with palpation to  right foot amputation site wound or to right lower leg or calf.  No results for input(s): GRAMSTAIN, LABORGA in the last 8760 hours.  Assessment and Plan:  Problem List Items Addressed This Visit    None    Visit Diagnoses    Skin ulcer of foot, limited to breakdown of skin, right ([place])    -  Primary   Cellulitis of right leg       Relevant Medications   sulfamethoxazole-trimethoprim (BACTRIM) 400-80 MG tablet   Localized edema       Great toe amputation status, right (HCC)       Venous (peripheral) insufficiency         -Examined patient and discussed the progression of the wounds and treatment alternatives. -Cleansed ulcerations to right foot and leg and applied medihoney and then applied dry sterile compressive dressing.  Advised patient to have home nurse to continue with the same and follow any additional orders as given by Dr. March Rummage. -Rx Bactrim for preventative measures  -Advised patient to resume his Lasix medication to assist with control of edema -Advised patient if legs worsen to call office or go to ER immediately -Patient to return to office to see Dr. March Rummage as scheduled or sooner if problems arise.  Landis Martins, DPM

## 2018-04-18 DIAGNOSIS — G6181 Chronic inflammatory demyelinating polyneuritis: Secondary | ICD-10-CM | POA: Diagnosis not present

## 2018-04-18 DIAGNOSIS — F319 Bipolar disorder, unspecified: Secondary | ICD-10-CM | POA: Diagnosis not present

## 2018-04-18 DIAGNOSIS — I5022 Chronic systolic (congestive) heart failure: Secondary | ICD-10-CM | POA: Diagnosis not present

## 2018-04-18 DIAGNOSIS — I480 Paroxysmal atrial fibrillation: Secondary | ICD-10-CM | POA: Diagnosis not present

## 2018-04-18 DIAGNOSIS — I110 Hypertensive heart disease with heart failure: Secondary | ICD-10-CM | POA: Diagnosis not present

## 2018-04-18 DIAGNOSIS — Z4781 Encounter for orthopedic aftercare following surgical amputation: Secondary | ICD-10-CM | POA: Diagnosis not present

## 2018-04-18 DIAGNOSIS — E1169 Type 2 diabetes mellitus with other specified complication: Secondary | ICD-10-CM | POA: Diagnosis not present

## 2018-04-18 DIAGNOSIS — M86071 Acute hematogenous osteomyelitis, right ankle and foot: Secondary | ICD-10-CM | POA: Diagnosis not present

## 2018-04-18 DIAGNOSIS — G4733 Obstructive sleep apnea (adult) (pediatric): Secondary | ICD-10-CM | POA: Diagnosis not present

## 2018-04-21 NOTE — Progress Notes (Signed)
  Subjective:  Patient ID: Johnathan Wright, male    DOB: [birth year],  MRN: [id number]  Chief Complaint  Patient presents with  . Foot Ulcer    F/U Lt ulcer Pt. stated," it's doing fine, no concerns." Tx: silvadene   DOS: 11/20/17 Procedure: Right first metatarsal resection with filleted toe flap.  72 y.o. male returns for wound care.  States the ulcer is doing fine no concerns has been using Silvadene  Objective:  There were no vitals filed for this visit. General AA&O x3. Normal mood and affect.  Vascular Foot warm to touch.  Neurologic Sensation grossly diminished.  Dermatologic (Wound) R hallux flap healed.  Still some open area   Orthopedic: Right great toe amputation noted. Right fourth toe rectus. Right second toe rectus   Assessment & Plan:  Patient was evaluated and treated and all questions answered.  Status post right partial first ray amputation with filleted toe flap -Wound almost healed.  Continue Silvadene  Follow-up next week

## 2018-04-21 NOTE — Progress Notes (Signed)
  Subjective:  Patient ID: Johnathan Wright, male    DOB: Dec 10, 1945,  MRN: [id number]  Chief Complaint  Patient presents with  . Ulcer    F/U R ulcer pt. stated," it's doing fine." -Pt denies N/v/F/CH, drainage, swelling, and redness   DOS: 11/20/17 Procedure: Right first metatarsal resection with filleted toe flap.  72 y.o. male returns for wound care.  States the right foot is doing fine denies nausea vomiting fever chills drainage swelling redness about Objective:   There were no vitals filed for this visit. General AA&O x3. Normal mood and affect.  Vascular Foot warm to touch.  Neurologic Sensation grossly diminished.  Dermatologic (Wound) R hallux flap healed.  Almost completely healed   Orthopedic: Right great toe amputation noted. Right fourth toe rectus. Right second toe rectus   Assessment & Plan:  Patient was evaluated and treated and all questions answered.  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  Follow-up in 2 weeks

## 2018-04-21 NOTE — Progress Notes (Signed)
  Subjective:  Patient ID: Johnathan Wright, male    DOB: 06-Mar-1946,  MRN: [id number]  Chief Complaint  Patient presents with  . Foot Ulcer    fu on amp site left first still a few small open areas  HHN concerned about possible breakdown between toes    DOS: 11/20/17 Procedure: Right first metatarsal resection with filleted toe flap.  72 y.o. male returns for wound care.  States there is still a few small open areas home health care is concerned about Objective:   There were no vitals filed for this visit. General AA&O x3. Normal mood and affect.  Vascular Foot warm to touch.  Neurologic Sensation grossly diminished.  Dermatologic (Wound) R hallux flap healed.  still some open areas     Orthopedic: Right great toe amputation noted. Right fourth toe rectus. Right second toe rectus   Assessment & Plan:  Patient was evaluated and treated and all questions answered.  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

## 2018-04-23 ENCOUNTER — Ambulatory Visit (INDEPENDENT_AMBULATORY_CARE_PROVIDER_SITE_OTHER): Payer: Medicare HMO | Admitting: Podiatry

## 2018-04-23 ENCOUNTER — Encounter: Payer: Self-pay | Admitting: Podiatry

## 2018-04-23 VITALS — BP 138/78 | HR 83 | Temp 98.5°F | Resp 13

## 2018-04-23 DIAGNOSIS — L97511 Non-pressure chronic ulcer of other part of right foot limited to breakdown of skin: Secondary | ICD-10-CM | POA: Diagnosis not present

## 2018-04-23 DIAGNOSIS — L03115 Cellulitis of right lower limb: Secondary | ICD-10-CM | POA: Diagnosis not present

## 2018-04-24 DIAGNOSIS — G4733 Obstructive sleep apnea (adult) (pediatric): Secondary | ICD-10-CM | POA: Diagnosis not present

## 2018-04-24 DIAGNOSIS — Z4781 Encounter for orthopedic aftercare following surgical amputation: Secondary | ICD-10-CM | POA: Diagnosis not present

## 2018-04-24 DIAGNOSIS — I5022 Chronic systolic (congestive) heart failure: Secondary | ICD-10-CM | POA: Diagnosis not present

## 2018-04-24 DIAGNOSIS — I480 Paroxysmal atrial fibrillation: Secondary | ICD-10-CM | POA: Diagnosis not present

## 2018-04-24 DIAGNOSIS — E1169 Type 2 diabetes mellitus with other specified complication: Secondary | ICD-10-CM | POA: Diagnosis not present

## 2018-04-24 DIAGNOSIS — I110 Hypertensive heart disease with heart failure: Secondary | ICD-10-CM | POA: Diagnosis not present

## 2018-04-24 DIAGNOSIS — F319 Bipolar disorder, unspecified: Secondary | ICD-10-CM | POA: Diagnosis not present

## 2018-04-24 DIAGNOSIS — M86071 Acute hematogenous osteomyelitis, right ankle and foot: Secondary | ICD-10-CM | POA: Diagnosis not present

## 2018-04-24 DIAGNOSIS — G6181 Chronic inflammatory demyelinating polyneuritis: Secondary | ICD-10-CM | POA: Diagnosis not present

## 2018-04-30 ENCOUNTER — Telehealth: Payer: Self-pay | Admitting: Podiatry

## 2018-04-30 NOTE — Telephone Encounter (Signed)
I informed Johnathan Wright [facility] continue the unna boots until pt is seen in office next week.

## 2018-04-30 NOTE — Telephone Encounter (Signed)
This is [name] calling from [facility]. Mr. Harmes stated that Dr. March Rummage put on what sounds like an unna boot last week. He does not see Dr. March Rummage again until next week. I was wanting to know if you wanted me to change that today or tomorrow? Just give me a call at [phone]. Thank you. Bye.

## 2018-05-01 DIAGNOSIS — I110 Hypertensive heart disease with heart failure: Secondary | ICD-10-CM | POA: Diagnosis not present

## 2018-05-01 DIAGNOSIS — M86071 Acute hematogenous osteomyelitis, right ankle and foot: Secondary | ICD-10-CM | POA: Diagnosis not present

## 2018-05-01 DIAGNOSIS — G4733 Obstructive sleep apnea (adult) (pediatric): Secondary | ICD-10-CM | POA: Diagnosis not present

## 2018-05-01 DIAGNOSIS — E1169 Type 2 diabetes mellitus with other specified complication: Secondary | ICD-10-CM | POA: Diagnosis not present

## 2018-05-01 DIAGNOSIS — F319 Bipolar disorder, unspecified: Secondary | ICD-10-CM | POA: Diagnosis not present

## 2018-05-01 DIAGNOSIS — I5022 Chronic systolic (congestive) heart failure: Secondary | ICD-10-CM | POA: Diagnosis not present

## 2018-05-01 DIAGNOSIS — I480 Paroxysmal atrial fibrillation: Secondary | ICD-10-CM | POA: Diagnosis not present

## 2018-05-01 DIAGNOSIS — G6181 Chronic inflammatory demyelinating polyneuritis: Secondary | ICD-10-CM | POA: Diagnosis not present

## 2018-05-01 DIAGNOSIS — Z4781 Encounter for orthopedic aftercare following surgical amputation: Secondary | ICD-10-CM | POA: Diagnosis not present

## 2018-05-02 ENCOUNTER — Ambulatory Visit: Payer: Medicare HMO | Admitting: *Deleted

## 2018-05-02 DIAGNOSIS — L97521 Non-pressure chronic ulcer of other part of left foot limited to breakdown of skin: Principal | ICD-10-CM

## 2018-05-02 DIAGNOSIS — E11621 Type 2 diabetes mellitus with foot ulcer: Secondary | ICD-10-CM

## 2018-05-04 DIAGNOSIS — G6181 Chronic inflammatory demyelinating polyneuritis: Secondary | ICD-10-CM | POA: Diagnosis not present

## 2018-05-04 DIAGNOSIS — I110 Hypertensive heart disease with heart failure: Secondary | ICD-10-CM | POA: Diagnosis not present

## 2018-05-04 DIAGNOSIS — I480 Paroxysmal atrial fibrillation: Secondary | ICD-10-CM | POA: Diagnosis not present

## 2018-05-04 DIAGNOSIS — I5022 Chronic systolic (congestive) heart failure: Secondary | ICD-10-CM | POA: Diagnosis not present

## 2018-05-04 DIAGNOSIS — Z4781 Encounter for orthopedic aftercare following surgical amputation: Secondary | ICD-10-CM | POA: Diagnosis not present

## 2018-05-04 DIAGNOSIS — M86071 Acute hematogenous osteomyelitis, right ankle and foot: Secondary | ICD-10-CM | POA: Diagnosis not present

## 2018-05-04 DIAGNOSIS — E1169 Type 2 diabetes mellitus with other specified complication: Secondary | ICD-10-CM | POA: Diagnosis not present

## 2018-05-04 DIAGNOSIS — F319 Bipolar disorder, unspecified: Secondary | ICD-10-CM | POA: Diagnosis not present

## 2018-05-04 DIAGNOSIS — G4733 Obstructive sleep apnea (adult) (pediatric): Secondary | ICD-10-CM | POA: Diagnosis not present

## 2018-05-07 ENCOUNTER — Ambulatory Visit (INDEPENDENT_AMBULATORY_CARE_PROVIDER_SITE_OTHER): Payer: Medicare HMO | Admitting: Podiatry

## 2018-05-07 DIAGNOSIS — E08621 Diabetes mellitus due to underlying condition with foot ulcer: Secondary | ICD-10-CM

## 2018-05-07 DIAGNOSIS — L97521 Non-pressure chronic ulcer of other part of left foot limited to breakdown of skin: Secondary | ICD-10-CM

## 2018-05-07 NOTE — Progress Notes (Signed)
  Subjective:  Patient ID: Johnathan Wright, male    DOB: 24-Feb-1946,  MRN: [id number]  Chief Complaint  Patient presents with  . Foot Ulcer    F/U Lt foot ulcer Pt. stated," it's doing fine, no concerns." Tx: medihoney   DOS: 11/20/17 Procedure: Right first metatarsal resection with filleted toe flap.   72 y.o. male returns for wound care.  States the right foot is doing well.  Left toe ulcer is still present.  Objective:   There were no vitals filed for this visit. General AA&O x3. Normal mood and affect.  Vascular Foot warm to touch.  Neurologic Sensation grossly diminished.  Dermatologic (Wound) R hallux flap healed  Superficial ulcer left second toe  Orthopedic: Right great toe amputation noted. Right fourth toe rectus. Right second toe rectus   Assessment & Plan:  Patient was evaluated and treated and all questions answered.  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  L 2nd Toe wound -Dressed with medihoney and DSD.  Venous Stasis -Discussed compression socks with patient. Will look into acquiring for patient.

## 2018-05-08 DIAGNOSIS — M5136 Other intervertebral disc degeneration, lumbar region: Secondary | ICD-10-CM | POA: Diagnosis not present

## 2018-05-08 DIAGNOSIS — G894 Chronic pain syndrome: Secondary | ICD-10-CM | POA: Diagnosis not present

## 2018-05-08 DIAGNOSIS — I739 Peripheral vascular disease, unspecified: Secondary | ICD-10-CM | POA: Diagnosis not present

## 2018-05-08 DIAGNOSIS — M461 Sacroiliitis, not elsewhere classified: Secondary | ICD-10-CM | POA: Diagnosis not present

## 2018-05-08 DIAGNOSIS — S7000XA Contusion of unspecified hip, initial encounter: Secondary | ICD-10-CM | POA: Diagnosis not present

## 2018-05-08 DIAGNOSIS — E1140 Type 2 diabetes mellitus with diabetic neuropathy, unspecified: Secondary | ICD-10-CM | POA: Diagnosis not present

## 2018-05-08 DIAGNOSIS — J449 Chronic obstructive pulmonary disease, unspecified: Secondary | ICD-10-CM | POA: Diagnosis not present

## 2018-05-09 DIAGNOSIS — F319 Bipolar disorder, unspecified: Secondary | ICD-10-CM | POA: Diagnosis not present

## 2018-05-09 DIAGNOSIS — I110 Hypertensive heart disease with heart failure: Secondary | ICD-10-CM | POA: Diagnosis not present

## 2018-05-09 DIAGNOSIS — E1169 Type 2 diabetes mellitus with other specified complication: Secondary | ICD-10-CM | POA: Diagnosis not present

## 2018-05-09 DIAGNOSIS — I5022 Chronic systolic (congestive) heart failure: Secondary | ICD-10-CM | POA: Diagnosis not present

## 2018-05-09 DIAGNOSIS — G6181 Chronic inflammatory demyelinating polyneuritis: Secondary | ICD-10-CM | POA: Diagnosis not present

## 2018-05-09 DIAGNOSIS — Z4781 Encounter for orthopedic aftercare following surgical amputation: Secondary | ICD-10-CM | POA: Diagnosis not present

## 2018-05-09 DIAGNOSIS — G4733 Obstructive sleep apnea (adult) (pediatric): Secondary | ICD-10-CM | POA: Diagnosis not present

## 2018-05-09 DIAGNOSIS — I480 Paroxysmal atrial fibrillation: Secondary | ICD-10-CM | POA: Diagnosis not present

## 2018-05-09 DIAGNOSIS — M86071 Acute hematogenous osteomyelitis, right ankle and foot: Secondary | ICD-10-CM | POA: Diagnosis not present

## 2018-05-12 NOTE — Progress Notes (Signed)
  Subjective:  Patient ID: Johnathan Wright, male    DOB: 1946-07-18,  MRN: [id number]  Chief Complaint  Patient presents with  . Foot Ulcer    F/U R foot ulcer Pt. stated," seems like it's looking fine, but still swollen." Tx: Bactrim   . Nail Problem    L hallux nailbed (dropped a pot and toenial started bleeding) x 3 days; no pain Tx: bandage   DOS: 11/20/17 Procedure: Right first metatarsal resection with filleted toe flap.  72 y.o. male returns for wound care.  Saw Dr. Cannon Kettle between last visit.  Had swelling the leg was prescribed Bactrim.  States that the foot is looking fine but is a little swollen.  New problem to left hallux nail since he tried to pop on the toenail.  Started bleeding.  Present for the past 3 days.  Denies pain.  Has been Objective:   Vitals:   04/23/18 1326  BP: 138/78  Pulse: 83  Resp: 13  Temp: 98.5 F (36.9 C)   General AA&O x3. Normal mood and affect.  Vascular Foot warm to touch.  Neurologic Sensation grossly diminished.  Dermatologic (Wound) R hallux flap almost completely healed Superficial abrasion left great toe  Orthopedic: Right great toe amputation noted. Right fourth toe rectus. Right second toe rectus   Assessment & Plan:  Patient was evaluated and treated and all questions answered.  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

## 2018-05-15 DIAGNOSIS — E1142 Type 2 diabetes mellitus with diabetic polyneuropathy: Secondary | ICD-10-CM | POA: Diagnosis not present

## 2018-05-15 DIAGNOSIS — G6181 Chronic inflammatory demyelinating polyneuritis: Secondary | ICD-10-CM | POA: Diagnosis not present

## 2018-05-15 DIAGNOSIS — E1121 Type 2 diabetes mellitus with diabetic nephropathy: Secondary | ICD-10-CM | POA: Diagnosis not present

## 2018-05-15 DIAGNOSIS — E782 Mixed hyperlipidemia: Secondary | ICD-10-CM | POA: Diagnosis not present

## 2018-05-15 DIAGNOSIS — L304 Erythema intertrigo: Secondary | ICD-10-CM | POA: Diagnosis not present

## 2018-05-15 DIAGNOSIS — N39490 Overflow incontinence: Secondary | ICD-10-CM | POA: Diagnosis not present

## 2018-05-15 DIAGNOSIS — M545 Low back pain: Secondary | ICD-10-CM | POA: Diagnosis not present

## 2018-05-15 DIAGNOSIS — F332 Major depressive disorder, recurrent severe without psychotic features: Secondary | ICD-10-CM | POA: Diagnosis not present

## 2018-05-15 DIAGNOSIS — I482 Chronic atrial fibrillation: Secondary | ICD-10-CM | POA: Diagnosis not present

## 2018-05-16 DIAGNOSIS — I480 Paroxysmal atrial fibrillation: Secondary | ICD-10-CM | POA: Diagnosis not present

## 2018-05-16 DIAGNOSIS — E1169 Type 2 diabetes mellitus with other specified complication: Secondary | ICD-10-CM | POA: Diagnosis not present

## 2018-05-16 DIAGNOSIS — G4733 Obstructive sleep apnea (adult) (pediatric): Secondary | ICD-10-CM | POA: Diagnosis not present

## 2018-05-16 DIAGNOSIS — Z4781 Encounter for orthopedic aftercare following surgical amputation: Secondary | ICD-10-CM | POA: Diagnosis not present

## 2018-05-16 DIAGNOSIS — G6181 Chronic inflammatory demyelinating polyneuritis: Secondary | ICD-10-CM | POA: Diagnosis not present

## 2018-05-16 DIAGNOSIS — F319 Bipolar disorder, unspecified: Secondary | ICD-10-CM | POA: Diagnosis not present

## 2018-05-16 DIAGNOSIS — I110 Hypertensive heart disease with heart failure: Secondary | ICD-10-CM | POA: Diagnosis not present

## 2018-05-16 DIAGNOSIS — I5022 Chronic systolic (congestive) heart failure: Secondary | ICD-10-CM | POA: Diagnosis not present

## 2018-05-16 DIAGNOSIS — M86071 Acute hematogenous osteomyelitis, right ankle and foot: Secondary | ICD-10-CM | POA: Diagnosis not present

## 2018-05-21 ENCOUNTER — Ambulatory Visit: Payer: Medicare HMO | Admitting: Podiatry

## 2018-05-22 DIAGNOSIS — M5136 Other intervertebral disc degeneration, lumbar region: Secondary | ICD-10-CM | POA: Diagnosis not present

## 2018-05-22 DIAGNOSIS — J449 Chronic obstructive pulmonary disease, unspecified: Secondary | ICD-10-CM | POA: Diagnosis not present

## 2018-05-22 DIAGNOSIS — I739 Peripheral vascular disease, unspecified: Secondary | ICD-10-CM | POA: Diagnosis not present

## 2018-05-22 DIAGNOSIS — E1140 Type 2 diabetes mellitus with diabetic neuropathy, unspecified: Secondary | ICD-10-CM | POA: Diagnosis not present

## 2018-05-22 DIAGNOSIS — M461 Sacroiliitis, not elsewhere classified: Secondary | ICD-10-CM | POA: Diagnosis not present

## 2018-05-22 DIAGNOSIS — G894 Chronic pain syndrome: Secondary | ICD-10-CM | POA: Diagnosis not present

## 2018-05-22 DIAGNOSIS — S7000XA Contusion of unspecified hip, initial encounter: Secondary | ICD-10-CM | POA: Diagnosis not present

## 2018-05-24 DIAGNOSIS — F319 Bipolar disorder, unspecified: Secondary | ICD-10-CM | POA: Diagnosis not present

## 2018-05-24 DIAGNOSIS — I5022 Chronic systolic (congestive) heart failure: Secondary | ICD-10-CM | POA: Diagnosis not present

## 2018-05-24 DIAGNOSIS — G4733 Obstructive sleep apnea (adult) (pediatric): Secondary | ICD-10-CM | POA: Diagnosis not present

## 2018-05-24 DIAGNOSIS — E1169 Type 2 diabetes mellitus with other specified complication: Secondary | ICD-10-CM | POA: Diagnosis not present

## 2018-05-24 DIAGNOSIS — Z4781 Encounter for orthopedic aftercare following surgical amputation: Secondary | ICD-10-CM | POA: Diagnosis not present

## 2018-05-24 DIAGNOSIS — G6181 Chronic inflammatory demyelinating polyneuritis: Secondary | ICD-10-CM | POA: Diagnosis not present

## 2018-05-24 DIAGNOSIS — I110 Hypertensive heart disease with heart failure: Secondary | ICD-10-CM | POA: Diagnosis not present

## 2018-05-24 DIAGNOSIS — I480 Paroxysmal atrial fibrillation: Secondary | ICD-10-CM | POA: Diagnosis not present

## 2018-05-24 DIAGNOSIS — M86071 Acute hematogenous osteomyelitis, right ankle and foot: Secondary | ICD-10-CM | POA: Diagnosis not present

## 2018-05-30 ENCOUNTER — Ambulatory Visit: Payer: Medicare HMO | Admitting: *Deleted

## 2018-05-30 DIAGNOSIS — F319 Bipolar disorder, unspecified: Secondary | ICD-10-CM | POA: Diagnosis not present

## 2018-05-30 DIAGNOSIS — E1169 Type 2 diabetes mellitus with other specified complication: Secondary | ICD-10-CM | POA: Diagnosis not present

## 2018-05-30 DIAGNOSIS — L97521 Non-pressure chronic ulcer of other part of left foot limited to breakdown of skin: Secondary | ICD-10-CM

## 2018-05-30 DIAGNOSIS — I5022 Chronic systolic (congestive) heart failure: Secondary | ICD-10-CM | POA: Diagnosis not present

## 2018-05-30 DIAGNOSIS — I480 Paroxysmal atrial fibrillation: Secondary | ICD-10-CM | POA: Diagnosis not present

## 2018-05-30 DIAGNOSIS — G4733 Obstructive sleep apnea (adult) (pediatric): Secondary | ICD-10-CM | POA: Diagnosis not present

## 2018-05-30 DIAGNOSIS — I110 Hypertensive heart disease with heart failure: Secondary | ICD-10-CM | POA: Diagnosis not present

## 2018-05-30 DIAGNOSIS — M86071 Acute hematogenous osteomyelitis, right ankle and foot: Secondary | ICD-10-CM | POA: Diagnosis not present

## 2018-05-30 DIAGNOSIS — Z4781 Encounter for orthopedic aftercare following surgical amputation: Secondary | ICD-10-CM | POA: Diagnosis not present

## 2018-05-30 DIAGNOSIS — G6181 Chronic inflammatory demyelinating polyneuritis: Secondary | ICD-10-CM | POA: Diagnosis not present

## 2018-05-30 DIAGNOSIS — E08621 Diabetes mellitus due to underlying condition with foot ulcer: Secondary | ICD-10-CM

## 2018-06-06 DIAGNOSIS — G6181 Chronic inflammatory demyelinating polyneuritis: Secondary | ICD-10-CM | POA: Diagnosis not present

## 2018-06-06 DIAGNOSIS — M86071 Acute hematogenous osteomyelitis, right ankle and foot: Secondary | ICD-10-CM | POA: Diagnosis not present

## 2018-06-06 DIAGNOSIS — G4733 Obstructive sleep apnea (adult) (pediatric): Secondary | ICD-10-CM | POA: Diagnosis not present

## 2018-06-06 DIAGNOSIS — I110 Hypertensive heart disease with heart failure: Secondary | ICD-10-CM | POA: Diagnosis not present

## 2018-06-06 DIAGNOSIS — I5022 Chronic systolic (congestive) heart failure: Secondary | ICD-10-CM | POA: Diagnosis not present

## 2018-06-06 DIAGNOSIS — I480 Paroxysmal atrial fibrillation: Secondary | ICD-10-CM | POA: Diagnosis not present

## 2018-06-06 DIAGNOSIS — E1169 Type 2 diabetes mellitus with other specified complication: Secondary | ICD-10-CM | POA: Diagnosis not present

## 2018-06-06 DIAGNOSIS — Z4781 Encounter for orthopedic aftercare following surgical amputation: Secondary | ICD-10-CM | POA: Diagnosis not present

## 2018-06-06 DIAGNOSIS — F319 Bipolar disorder, unspecified: Secondary | ICD-10-CM | POA: Diagnosis not present

## 2018-06-07 DIAGNOSIS — Z4781 Encounter for orthopedic aftercare following surgical amputation: Secondary | ICD-10-CM | POA: Diagnosis not present

## 2018-06-07 DIAGNOSIS — R58 Hemorrhage, not elsewhere classified: Secondary | ICD-10-CM | POA: Diagnosis not present

## 2018-06-07 DIAGNOSIS — I480 Paroxysmal atrial fibrillation: Secondary | ICD-10-CM | POA: Diagnosis not present

## 2018-06-07 DIAGNOSIS — E1169 Type 2 diabetes mellitus with other specified complication: Secondary | ICD-10-CM | POA: Diagnosis not present

## 2018-06-07 DIAGNOSIS — Z23 Encounter for immunization: Secondary | ICD-10-CM | POA: Diagnosis not present

## 2018-06-07 DIAGNOSIS — M86071 Acute hematogenous osteomyelitis, right ankle and foot: Secondary | ICD-10-CM | POA: Diagnosis not present

## 2018-06-07 DIAGNOSIS — G6181 Chronic inflammatory demyelinating polyneuritis: Secondary | ICD-10-CM | POA: Diagnosis not present

## 2018-06-07 DIAGNOSIS — S81812A Laceration without foreign body, left lower leg, initial encounter: Secondary | ICD-10-CM | POA: Diagnosis not present

## 2018-06-07 DIAGNOSIS — G4733 Obstructive sleep apnea (adult) (pediatric): Secondary | ICD-10-CM | POA: Diagnosis not present

## 2018-06-07 DIAGNOSIS — R0902 Hypoxemia: Secondary | ICD-10-CM | POA: Diagnosis not present

## 2018-06-07 DIAGNOSIS — F319 Bipolar disorder, unspecified: Secondary | ICD-10-CM | POA: Diagnosis not present

## 2018-06-07 DIAGNOSIS — I5022 Chronic systolic (congestive) heart failure: Secondary | ICD-10-CM | POA: Diagnosis not present

## 2018-06-07 DIAGNOSIS — I110 Hypertensive heart disease with heart failure: Secondary | ICD-10-CM | POA: Diagnosis not present

## 2018-06-08 DIAGNOSIS — I480 Paroxysmal atrial fibrillation: Secondary | ICD-10-CM | POA: Diagnosis not present

## 2018-06-08 DIAGNOSIS — I5022 Chronic systolic (congestive) heart failure: Secondary | ICD-10-CM | POA: Diagnosis not present

## 2018-06-08 DIAGNOSIS — Z4781 Encounter for orthopedic aftercare following surgical amputation: Secondary | ICD-10-CM | POA: Diagnosis not present

## 2018-06-08 DIAGNOSIS — E1169 Type 2 diabetes mellitus with other specified complication: Secondary | ICD-10-CM | POA: Diagnosis not present

## 2018-06-08 DIAGNOSIS — F319 Bipolar disorder, unspecified: Secondary | ICD-10-CM | POA: Diagnosis not present

## 2018-06-08 DIAGNOSIS — M86071 Acute hematogenous osteomyelitis, right ankle and foot: Secondary | ICD-10-CM | POA: Diagnosis not present

## 2018-06-08 DIAGNOSIS — I110 Hypertensive heart disease with heart failure: Secondary | ICD-10-CM | POA: Diagnosis not present

## 2018-06-08 DIAGNOSIS — G4733 Obstructive sleep apnea (adult) (pediatric): Secondary | ICD-10-CM | POA: Diagnosis not present

## 2018-06-08 DIAGNOSIS — G6181 Chronic inflammatory demyelinating polyneuritis: Secondary | ICD-10-CM | POA: Diagnosis not present

## 2018-06-10 ENCOUNTER — Telehealth: Payer: Self-pay | Admitting: Podiatry

## 2018-06-10 ENCOUNTER — Ambulatory Visit (INDEPENDENT_AMBULATORY_CARE_PROVIDER_SITE_OTHER): Payer: Medicare HMO | Admitting: Podiatry

## 2018-06-10 DIAGNOSIS — I83023 Varicose veins of left lower extremity with ulcer of ankle: Secondary | ICD-10-CM

## 2018-06-10 DIAGNOSIS — L97329 Non-pressure chronic ulcer of left ankle with unspecified severity: Secondary | ICD-10-CM | POA: Diagnosis not present

## 2018-06-10 DIAGNOSIS — R600 Localized edema: Secondary | ICD-10-CM

## 2018-06-10 NOTE — Telephone Encounter (Signed)
Unable to schedule pt he has calling restrictions on his phone.

## 2018-06-10 NOTE — Telephone Encounter (Signed)
This is Arts development officer, Therapist, sports with [facility]. I was calling to let Dr. March Rummage know that Mr. Cull fell on Friday evening at home. When he fell, his legs went up under his bed and he sheared the front of both of his legs. They called EMS and he went to the emergency room at [facility] The front left leg required sutures by the emergency room doctor, he had a 10 cm semi-circle skin tear with a large hematoma. The right has like a 2x2 skin tear on that one. They called me on Saturday evening and wanted me to come look at it because blood was coming through the bandages. The ER doctor just used like a triple antibiotic ointment, Telfa and then wrapped it with coban. I just wanted to let Dr. March Rummage know. If you have any questions, you can give me a call. My number is [phone]. Thank you.

## 2018-06-10 NOTE — Telephone Encounter (Signed)
Will attempt to get patient in for eval

## 2018-06-10 NOTE — Telephone Encounter (Signed)
Left message requesting Sharyn Lull RN - [facility] to have pt call for an appt.

## 2018-06-11 ENCOUNTER — Telehealth: Payer: Self-pay | Admitting: Podiatry

## 2018-06-11 DIAGNOSIS — G4733 Obstructive sleep apnea (adult) (pediatric): Secondary | ICD-10-CM | POA: Diagnosis not present

## 2018-06-11 DIAGNOSIS — F319 Bipolar disorder, unspecified: Secondary | ICD-10-CM | POA: Diagnosis not present

## 2018-06-11 DIAGNOSIS — E1169 Type 2 diabetes mellitus with other specified complication: Secondary | ICD-10-CM | POA: Diagnosis not present

## 2018-06-11 DIAGNOSIS — I5022 Chronic systolic (congestive) heart failure: Secondary | ICD-10-CM | POA: Diagnosis not present

## 2018-06-11 DIAGNOSIS — G6181 Chronic inflammatory demyelinating polyneuritis: Secondary | ICD-10-CM | POA: Diagnosis not present

## 2018-06-11 DIAGNOSIS — M86071 Acute hematogenous osteomyelitis, right ankle and foot: Secondary | ICD-10-CM | POA: Diagnosis not present

## 2018-06-11 DIAGNOSIS — I110 Hypertensive heart disease with heart failure: Secondary | ICD-10-CM | POA: Diagnosis not present

## 2018-06-11 DIAGNOSIS — I480 Paroxysmal atrial fibrillation: Secondary | ICD-10-CM | POA: Diagnosis not present

## 2018-06-11 DIAGNOSIS — Z4781 Encounter for orthopedic aftercare following surgical amputation: Secondary | ICD-10-CM | POA: Diagnosis not present

## 2018-06-11 NOTE — Telephone Encounter (Signed)
This is Johnathan Wright with [facility]. [name] said he fell and hurt his leg and saw Dr. March Rummage on Monday and that you guys put an unna boot on and wants it changed 3 times a week. Is there anything else you want on the wound? I know he uses medihoney a lot. So if you will just let me know. [phone]. Thank you. Oh and you can leave a message if I don't answer. Thank you. Bye.

## 2018-06-11 NOTE — Telephone Encounter (Signed)
[facility] states pt was seen yesterday by Dr. March Rummage and asked if pt was to continue the unna boot and if he was to use medihoney. Skyped message to Dr. March Rummage for orders.

## 2018-06-12 DIAGNOSIS — E1169 Type 2 diabetes mellitus with other specified complication: Secondary | ICD-10-CM | POA: Diagnosis not present

## 2018-06-12 DIAGNOSIS — I5022 Chronic systolic (congestive) heart failure: Secondary | ICD-10-CM | POA: Diagnosis not present

## 2018-06-12 DIAGNOSIS — G4733 Obstructive sleep apnea (adult) (pediatric): Secondary | ICD-10-CM | POA: Diagnosis not present

## 2018-06-12 DIAGNOSIS — F319 Bipolar disorder, unspecified: Secondary | ICD-10-CM | POA: Diagnosis not present

## 2018-06-12 DIAGNOSIS — G6181 Chronic inflammatory demyelinating polyneuritis: Secondary | ICD-10-CM | POA: Diagnosis not present

## 2018-06-12 DIAGNOSIS — I110 Hypertensive heart disease with heart failure: Secondary | ICD-10-CM | POA: Diagnosis not present

## 2018-06-12 DIAGNOSIS — Z4781 Encounter for orthopedic aftercare following surgical amputation: Secondary | ICD-10-CM | POA: Diagnosis not present

## 2018-06-12 DIAGNOSIS — M86071 Acute hematogenous osteomyelitis, right ankle and foot: Secondary | ICD-10-CM | POA: Diagnosis not present

## 2018-06-12 DIAGNOSIS — I480 Paroxysmal atrial fibrillation: Secondary | ICD-10-CM | POA: Diagnosis not present

## 2018-06-12 NOTE — Telephone Encounter (Signed)
Continue Unna Boot LLE. Medihoney ok for RLE

## 2018-06-12 NOTE — Progress Notes (Signed)
Subjective:  Patient ID: Johnathan Wright, male    DOB: [birth year],  MRN: [id number]  No chief complaint on file.   72 y.o. male presents for follow-up wound care.  Received call from home health care the patient fell and received a laceration to both lower extremities.  Has been applying dressings to both lower extremity's with antibiotic cream   Review of Systems: Negative except as noted in the HPI. Denies N/V/F/Ch.  Past Medical History:  Diagnosis Date  . Atrial fibrillation ([place])   . Cellulitis   . CIDP (chronic inflammatory demyelinating polyneuropathy) ([place])   . Depression   . Diabetes ([place])   . Enlarged heart   . Gout     Current Outpatient Medications:  .  allopurinol (ZYLOPRIM) 100 MG tablet, Take 100 mg by mouth daily., Disp: , Rfl:  .  amoxicillin-clavulanate (AUGMENTIN) 875-125 MG tablet, Take 1 tablet by mouth 2 (two) times daily., Disp: 20 tablet, Rfl: 0 .  ARIPiprazole (ABILIFY) 2 MG tablet, Take 2 mg by mouth daily., Disp: , Rfl:  .  aspirin 81 MG tablet, Take 81 mg by mouth daily., Disp: , Rfl:  .  atorvastatin (LIPITOR) 10 MG tablet, Take 10 mg by mouth daily., Disp: , Rfl:  .  Bilberry 1000 MG CAPS, Take by mouth., Disp: , Rfl:  .  CINNAMON PO, Take by mouth daily., Disp: , Rfl:  .  ciprofloxacin (CIPRO) 500 MG tablet, Take 1 tablet (500 mg total) by mouth 2 (two) times daily., Disp: 28 tablet, Rfl: 0 .  clindamycin (CLEOCIN) 300 MG capsule, Take 1 capsule (300 mg total) by mouth 3 (three) times daily., Disp: 30 capsule, Rfl: 0 .  clotrimazole (LOTRIMIN) 1 % cream, Apply 1 application topically 2 (two) times daily., Disp: , Rfl:  .  collagenase (SANTYL) ointment, Apply 1 application topically daily., Disp: 15 g, Rfl: 5 .  cyanocobalamin (,VITAMIN B-12,) 1000 MCG/ML injection, Inject 1 mL (1,000 mcg total) into the muscle once. Vitamin B12 1 ml IM daily x 7 days then 1 ml IM weekly x 4 weeks then 1 ml IM monthly x 1 year., Disp: 1 mL, Rfl: 0 .  desvenlafaxine  (PRISTIQ) 100 MG 24 hr tablet, Take 100 mg by mouth daily., Disp: , Rfl:  .  fentaNYL (DURAGESIC - DOSED MCG/HR) 50 MCG/HR, Place 50 mcg onto the skin every 3 (three) days., Disp: , Rfl:  .  furosemide (LASIX) 40 MG tablet, Take 40 mg by mouth., Disp: , Rfl:  .  HYDROcodone-acetaminophen (NORCO) 10-325 MG tablet, Take 1 tablet by mouth every 4 (four) hours as needed., Disp: 20 tablet, Rfl: 0 .  Insulin Glargine (TOUJEO SOLOSTAR) 300 UNIT/ML SOPN, Inject into the skin., Disp: , Rfl:  .  L-Arginine 500 MG CAPS, Take by mouth., Disp: , Rfl:  .  Liraglutide (VICTOZA) 18 MG/3ML SOPN, Inject into the skin., Disp: , Rfl:  .  Lutein 40 MG CAPS, Take by mouth., Disp: , Rfl:  .  Lutein 6 MG TABS, Take by mouth., Disp: , Rfl:  .  LYSINE PO, Take 1,000 mg by mouth daily., Disp: , Rfl:  .  metFORMIN (GLUCOPHAGE) 1000 MG tablet, Take 1,000 mg by mouth 2 (two) times daily with a meal., Disp: , Rfl:  .  oxycodone (OXY-IR) 5 MG capsule, Take 5 mg by mouth every 4 (four) hours as needed., Disp: , Rfl:  .  sulfamethoxazole-trimethoprim (BACTRIM) 400-80 MG tablet, Take 1 tablet by mouth 2 (two) times daily., Disp: 28  tablet, Rfl: 0 .  vortioxetine HBr (TRINTELLIX) 10 MG TABS, Take 10 mg by mouth 1 day or 1 dose., Disp: , Rfl:   Social History   Tobacco Use  Smoking Status Former Smoker  . Last attempt to quit: 10/23/1969  . Years since quitting: 48.7  Smokeless Tobacco Never Used    No Known Allergies Objective:  There were no vitals filed for this visit. There is no height or weight on file to calculate BMI. Constitutional Well developed. Well nourished.  Vascular Dorsalis pedis pulses palpable bilaterally. Posterior tibial pulses palpable bilaterally. Capillary refill normal to all digits.  No cyanosis or clubbing noted. Pedal hair growth normal.  Neurologic Normal speech. Oriented to person, place, and time. Epicritic sensation to light touch grossly present bilaterally.  Dermatologic Nails well  groomed and normal in appearance. Lacerations to the anterior shin bilaterally worse than left.  Approximately 6 x 3 on the left  Orthopedic: Normal joint ROM without pain or crepitus bilaterally. No visible deformities. No bony tenderness.   Radiographs: None Assessment:   1. Laceration of left lower extremity, initial encounter   2. Localized edema    Plan:  Patient was evaluated and treated and all questions answered.  Lacerations Both Lower Extremities -Unna boot applied to the left lower extremity -Patient to get home health care with Unna boot therapy -Up in 1 week for recheck  No follow-ups on file.

## 2018-06-12 NOTE — Telephone Encounter (Signed)
Left message informing [facility] of Dr. Eleanora Neighbor orders.

## 2018-06-13 DIAGNOSIS — G6181 Chronic inflammatory demyelinating polyneuritis: Secondary | ICD-10-CM | POA: Diagnosis not present

## 2018-06-13 DIAGNOSIS — M86071 Acute hematogenous osteomyelitis, right ankle and foot: Secondary | ICD-10-CM | POA: Diagnosis not present

## 2018-06-13 DIAGNOSIS — I5022 Chronic systolic (congestive) heart failure: Secondary | ICD-10-CM | POA: Diagnosis not present

## 2018-06-13 DIAGNOSIS — G4733 Obstructive sleep apnea (adult) (pediatric): Secondary | ICD-10-CM | POA: Diagnosis not present

## 2018-06-13 DIAGNOSIS — E1169 Type 2 diabetes mellitus with other specified complication: Secondary | ICD-10-CM | POA: Diagnosis not present

## 2018-06-13 DIAGNOSIS — I110 Hypertensive heart disease with heart failure: Secondary | ICD-10-CM | POA: Diagnosis not present

## 2018-06-13 DIAGNOSIS — Z4781 Encounter for orthopedic aftercare following surgical amputation: Secondary | ICD-10-CM | POA: Diagnosis not present

## 2018-06-13 DIAGNOSIS — I480 Paroxysmal atrial fibrillation: Secondary | ICD-10-CM | POA: Diagnosis not present

## 2018-06-13 DIAGNOSIS — F319 Bipolar disorder, unspecified: Secondary | ICD-10-CM | POA: Diagnosis not present

## 2018-06-14 DIAGNOSIS — G4730 Sleep apnea, unspecified: Secondary | ICD-10-CM | POA: Diagnosis not present

## 2018-06-14 DIAGNOSIS — M109 Gout, unspecified: Secondary | ICD-10-CM | POA: Diagnosis not present

## 2018-06-14 DIAGNOSIS — S81812A Laceration without foreign body, left lower leg, initial encounter: Secondary | ICD-10-CM | POA: Diagnosis not present

## 2018-06-14 DIAGNOSIS — L304 Erythema intertrigo: Secondary | ICD-10-CM | POA: Diagnosis not present

## 2018-06-14 DIAGNOSIS — L03116 Cellulitis of left lower limb: Secondary | ICD-10-CM | POA: Diagnosis not present

## 2018-06-14 DIAGNOSIS — F330 Major depressive disorder, recurrent, mild: Secondary | ICD-10-CM | POA: Diagnosis not present

## 2018-06-14 DIAGNOSIS — M79605 Pain in left leg: Secondary | ICD-10-CM | POA: Diagnosis not present

## 2018-06-14 DIAGNOSIS — Z794 Long term (current) use of insulin: Secondary | ICD-10-CM | POA: Diagnosis not present

## 2018-06-14 DIAGNOSIS — L0889 Other specified local infections of the skin and subcutaneous tissue: Secondary | ICD-10-CM | POA: Diagnosis not present

## 2018-06-14 DIAGNOSIS — E1140 Type 2 diabetes mellitus with diabetic neuropathy, unspecified: Secondary | ICD-10-CM | POA: Diagnosis not present

## 2018-06-14 DIAGNOSIS — W06XXXA Fall from bed, initial encounter: Secondary | ICD-10-CM | POA: Diagnosis not present

## 2018-06-14 DIAGNOSIS — I4891 Unspecified atrial fibrillation: Secondary | ICD-10-CM | POA: Diagnosis not present

## 2018-06-14 DIAGNOSIS — S81802A Unspecified open wound, left lower leg, initial encounter: Secondary | ICD-10-CM | POA: Diagnosis not present

## 2018-06-14 DIAGNOSIS — I509 Heart failure, unspecified: Secondary | ICD-10-CM | POA: Diagnosis not present

## 2018-06-14 DIAGNOSIS — M47819 Spondylosis without myelopathy or radiculopathy, site unspecified: Secondary | ICD-10-CM | POA: Diagnosis not present

## 2018-06-14 DIAGNOSIS — E1151 Type 2 diabetes mellitus with diabetic peripheral angiopathy without gangrene: Secondary | ICD-10-CM | POA: Diagnosis not present

## 2018-06-14 DIAGNOSIS — L97822 Non-pressure chronic ulcer of other part of left lower leg with fat layer exposed: Secondary | ICD-10-CM | POA: Diagnosis not present

## 2018-06-17 DIAGNOSIS — I110 Hypertensive heart disease with heart failure: Secondary | ICD-10-CM | POA: Diagnosis not present

## 2018-06-17 DIAGNOSIS — Z4781 Encounter for orthopedic aftercare following surgical amputation: Secondary | ICD-10-CM | POA: Diagnosis not present

## 2018-06-17 DIAGNOSIS — G6181 Chronic inflammatory demyelinating polyneuritis: Secondary | ICD-10-CM | POA: Diagnosis not present

## 2018-06-17 DIAGNOSIS — M86071 Acute hematogenous osteomyelitis, right ankle and foot: Secondary | ICD-10-CM | POA: Diagnosis not present

## 2018-06-17 DIAGNOSIS — I5022 Chronic systolic (congestive) heart failure: Secondary | ICD-10-CM | POA: Diagnosis not present

## 2018-06-17 DIAGNOSIS — G4733 Obstructive sleep apnea (adult) (pediatric): Secondary | ICD-10-CM | POA: Diagnosis not present

## 2018-06-17 DIAGNOSIS — I480 Paroxysmal atrial fibrillation: Secondary | ICD-10-CM | POA: Diagnosis not present

## 2018-06-17 DIAGNOSIS — E1169 Type 2 diabetes mellitus with other specified complication: Secondary | ICD-10-CM | POA: Diagnosis not present

## 2018-06-17 DIAGNOSIS — F319 Bipolar disorder, unspecified: Secondary | ICD-10-CM | POA: Diagnosis not present

## 2018-06-19 DIAGNOSIS — I480 Paroxysmal atrial fibrillation: Secondary | ICD-10-CM | POA: Diagnosis not present

## 2018-06-19 DIAGNOSIS — M86071 Acute hematogenous osteomyelitis, right ankle and foot: Secondary | ICD-10-CM | POA: Diagnosis not present

## 2018-06-19 DIAGNOSIS — G6181 Chronic inflammatory demyelinating polyneuritis: Secondary | ICD-10-CM | POA: Diagnosis not present

## 2018-06-19 DIAGNOSIS — I5022 Chronic systolic (congestive) heart failure: Secondary | ICD-10-CM | POA: Diagnosis not present

## 2018-06-19 DIAGNOSIS — E1169 Type 2 diabetes mellitus with other specified complication: Secondary | ICD-10-CM | POA: Diagnosis not present

## 2018-06-19 DIAGNOSIS — Z4781 Encounter for orthopedic aftercare following surgical amputation: Secondary | ICD-10-CM | POA: Diagnosis not present

## 2018-06-19 DIAGNOSIS — I110 Hypertensive heart disease with heart failure: Secondary | ICD-10-CM | POA: Diagnosis not present

## 2018-06-19 DIAGNOSIS — G4733 Obstructive sleep apnea (adult) (pediatric): Secondary | ICD-10-CM | POA: Diagnosis not present

## 2018-06-19 DIAGNOSIS — F319 Bipolar disorder, unspecified: Secondary | ICD-10-CM | POA: Diagnosis not present

## 2018-06-21 DIAGNOSIS — Z4781 Encounter for orthopedic aftercare following surgical amputation: Secondary | ICD-10-CM | POA: Diagnosis not present

## 2018-06-21 DIAGNOSIS — E1169 Type 2 diabetes mellitus with other specified complication: Secondary | ICD-10-CM | POA: Diagnosis not present

## 2018-06-21 DIAGNOSIS — M86071 Acute hematogenous osteomyelitis, right ankle and foot: Secondary | ICD-10-CM | POA: Diagnosis not present

## 2018-06-21 DIAGNOSIS — G6181 Chronic inflammatory demyelinating polyneuritis: Secondary | ICD-10-CM | POA: Diagnosis not present

## 2018-06-21 DIAGNOSIS — I110 Hypertensive heart disease with heart failure: Secondary | ICD-10-CM | POA: Diagnosis not present

## 2018-06-21 DIAGNOSIS — S81802A Unspecified open wound, left lower leg, initial encounter: Secondary | ICD-10-CM | POA: Diagnosis not present

## 2018-06-21 DIAGNOSIS — F319 Bipolar disorder, unspecified: Secondary | ICD-10-CM | POA: Diagnosis not present

## 2018-06-21 DIAGNOSIS — I5022 Chronic systolic (congestive) heart failure: Secondary | ICD-10-CM | POA: Diagnosis not present

## 2018-06-21 DIAGNOSIS — I480 Paroxysmal atrial fibrillation: Secondary | ICD-10-CM | POA: Diagnosis not present

## 2018-06-21 DIAGNOSIS — G4733 Obstructive sleep apnea (adult) (pediatric): Secondary | ICD-10-CM | POA: Diagnosis not present

## 2018-06-23 DIAGNOSIS — G4733 Obstructive sleep apnea (adult) (pediatric): Secondary | ICD-10-CM | POA: Diagnosis not present

## 2018-06-23 DIAGNOSIS — Z4781 Encounter for orthopedic aftercare following surgical amputation: Secondary | ICD-10-CM | POA: Diagnosis not present

## 2018-06-23 DIAGNOSIS — I110 Hypertensive heart disease with heart failure: Secondary | ICD-10-CM | POA: Diagnosis not present

## 2018-06-23 DIAGNOSIS — E1169 Type 2 diabetes mellitus with other specified complication: Secondary | ICD-10-CM | POA: Diagnosis not present

## 2018-06-23 DIAGNOSIS — I5022 Chronic systolic (congestive) heart failure: Secondary | ICD-10-CM | POA: Diagnosis not present

## 2018-06-23 DIAGNOSIS — F319 Bipolar disorder, unspecified: Secondary | ICD-10-CM | POA: Diagnosis not present

## 2018-06-23 DIAGNOSIS — G6181 Chronic inflammatory demyelinating polyneuritis: Secondary | ICD-10-CM | POA: Diagnosis not present

## 2018-06-23 DIAGNOSIS — I480 Paroxysmal atrial fibrillation: Secondary | ICD-10-CM | POA: Diagnosis not present

## 2018-06-23 DIAGNOSIS — M86071 Acute hematogenous osteomyelitis, right ankle and foot: Secondary | ICD-10-CM | POA: Diagnosis not present

## 2018-06-24 DIAGNOSIS — I480 Paroxysmal atrial fibrillation: Secondary | ICD-10-CM | POA: Diagnosis not present

## 2018-06-24 DIAGNOSIS — E1169 Type 2 diabetes mellitus with other specified complication: Secondary | ICD-10-CM | POA: Diagnosis not present

## 2018-06-24 DIAGNOSIS — I110 Hypertensive heart disease with heart failure: Secondary | ICD-10-CM | POA: Diagnosis not present

## 2018-06-24 DIAGNOSIS — G6181 Chronic inflammatory demyelinating polyneuritis: Secondary | ICD-10-CM | POA: Diagnosis not present

## 2018-06-24 DIAGNOSIS — I5022 Chronic systolic (congestive) heart failure: Secondary | ICD-10-CM | POA: Diagnosis not present

## 2018-06-24 DIAGNOSIS — M86071 Acute hematogenous osteomyelitis, right ankle and foot: Secondary | ICD-10-CM | POA: Diagnosis not present

## 2018-06-24 DIAGNOSIS — G4733 Obstructive sleep apnea (adult) (pediatric): Secondary | ICD-10-CM | POA: Diagnosis not present

## 2018-06-24 DIAGNOSIS — F319 Bipolar disorder, unspecified: Secondary | ICD-10-CM | POA: Diagnosis not present

## 2018-06-24 DIAGNOSIS — Z4781 Encounter for orthopedic aftercare following surgical amputation: Secondary | ICD-10-CM | POA: Diagnosis not present

## 2018-06-25 ENCOUNTER — Ambulatory Visit (INDEPENDENT_AMBULATORY_CARE_PROVIDER_SITE_OTHER): Payer: Medicare HMO | Admitting: Podiatry

## 2018-06-25 ENCOUNTER — Telehealth: Payer: Self-pay | Admitting: *Deleted

## 2018-06-25 DIAGNOSIS — I110 Hypertensive heart disease with heart failure: Secondary | ICD-10-CM | POA: Diagnosis not present

## 2018-06-25 DIAGNOSIS — E1169 Type 2 diabetes mellitus with other specified complication: Secondary | ICD-10-CM | POA: Diagnosis not present

## 2018-06-25 DIAGNOSIS — L97911 Non-pressure chronic ulcer of unspecified part of right lower leg limited to breakdown of skin: Secondary | ICD-10-CM | POA: Diagnosis not present

## 2018-06-25 DIAGNOSIS — M86071 Acute hematogenous osteomyelitis, right ankle and foot: Secondary | ICD-10-CM | POA: Diagnosis not present

## 2018-06-25 DIAGNOSIS — L97921 Non-pressure chronic ulcer of unspecified part of left lower leg limited to breakdown of skin: Secondary | ICD-10-CM

## 2018-06-25 DIAGNOSIS — G4733 Obstructive sleep apnea (adult) (pediatric): Secondary | ICD-10-CM | POA: Diagnosis not present

## 2018-06-25 DIAGNOSIS — Z4781 Encounter for orthopedic aftercare following surgical amputation: Secondary | ICD-10-CM | POA: Diagnosis not present

## 2018-06-25 DIAGNOSIS — F319 Bipolar disorder, unspecified: Secondary | ICD-10-CM | POA: Diagnosis not present

## 2018-06-25 DIAGNOSIS — I5022 Chronic systolic (congestive) heart failure: Secondary | ICD-10-CM | POA: Diagnosis not present

## 2018-06-25 DIAGNOSIS — G6181 Chronic inflammatory demyelinating polyneuritis: Secondary | ICD-10-CM | POA: Diagnosis not present

## 2018-06-25 DIAGNOSIS — I480 Paroxysmal atrial fibrillation: Secondary | ICD-10-CM | POA: Diagnosis not present

## 2018-06-25 NOTE — Progress Notes (Signed)
Subjective:  Patient ID: Johnathan Wright, male    DOB: [birth year],  MRN: [id number]  Chief Complaint  Patient presents with  . Foot Ulcer    F/U L foot ulcer PT. stated," it's doing good." -pt deneis any symptoms    72 y.o. male presents for wound care. States the wound is doing okay.  Was seen at the wound care center for concern of wound to left leg as regular provider was not in the office.  We will start antibiotics.  Been applying unknown to patient dressing to the wound that was given at wound care center.  Review of Systems: Negative except as noted in the HPI. Denies N/V/F/Ch.  Past Medical History:  Diagnosis Date  . Atrial fibrillation ([place])   . Cellulitis   . CIDP (chronic inflammatory demyelinating polyneuropathy) ([place])   . Depression   . Diabetes ([place])   . Enlarged heart   . Gout     Current Outpatient Medications:  .  allopurinol (ZYLOPRIM) 100 MG tablet, Take 100 mg by mouth daily., Disp: , Rfl:  .  amoxicillin-clavulanate (AUGMENTIN) 875-125 MG tablet, Take 1 tablet by mouth 2 (two) times daily., Disp: 20 tablet, Rfl: 0 .  ARIPiprazole (ABILIFY) 2 MG tablet, Take 2 mg by mouth daily., Disp: , Rfl:  .  aspirin 81 MG tablet, Take 81 mg by mouth daily., Disp: , Rfl:  .  atorvastatin (LIPITOR) 10 MG tablet, Take 10 mg by mouth daily., Disp: , Rfl:  .  Bilberry 1000 MG CAPS, Take by mouth., Disp: , Rfl:  .  CINNAMON PO, Take by mouth daily., Disp: , Rfl:  .  ciprofloxacin (CIPRO) 500 MG tablet, Take 1 tablet (500 mg total) by mouth 2 (two) times daily., Disp: 28 tablet, Rfl: 0 .  clindamycin (CLEOCIN) 300 MG capsule, Take 1 capsule (300 mg total) by mouth 3 (three) times daily., Disp: 42 capsule, Rfl: 0 .  clotrimazole (LOTRIMIN) 1 % cream, Apply 1 application topically 2 (two) times daily., Disp: , Rfl:  .  collagenase (SANTYL) ointment, Apply 1 application topically daily., Disp: 15 g, Rfl: 5 .  cyanocobalamin (,VITAMIN B-12,) 1000 MCG/ML injection, Inject 1 mL  (1,000 mcg total) into the muscle once. Vitamin B12 1 ml IM daily x 7 days then 1 ml IM weekly x 4 weeks then 1 ml IM monthly x 1 year., Disp: 1 mL, Rfl: 0 .  desvenlafaxine (PRISTIQ) 100 MG 24 hr tablet, Take 100 mg by mouth daily., Disp: , Rfl:  .  fentaNYL (DURAGESIC - DOSED MCG/HR) 50 MCG/HR, Place 50 mcg onto the skin every 3 (three) days., Disp: , Rfl:  .  furosemide (LASIX) 40 MG tablet, Take 40 mg by mouth., Disp: , Rfl:  .  Insulin Glargine (TOUJEO SOLOSTAR) 300 UNIT/ML SOPN, Inject into the skin., Disp: , Rfl:  .  L-Arginine 500 MG CAPS, Take by mouth., Disp: , Rfl:  .  Liraglutide (VICTOZA) 18 MG/3ML SOPN, Inject into the skin., Disp: , Rfl:  .  Lutein 40 MG CAPS, Take by mouth., Disp: , Rfl:  .  Lutein 6 MG TABS, Take by mouth., Disp: , Rfl:  .  LYSINE PO, Take 1,000 mg by mouth daily., Disp: , Rfl:  .  metFORMIN (GLUCOPHAGE) 1000 MG tablet, Take 1,000 mg by mouth 2 (two) times daily with a meal., Disp: , Rfl:  .  oxycodone (OXY-IR) 5 MG capsule, Take 5 mg by mouth every 4 (four) hours as needed., Disp: , Rfl:  .  sulfamethoxazole-trimethoprim (BACTRIM) 400-80 MG tablet, Take 1 tablet by mouth 2 (two) times daily., Disp: 28 tablet, Rfl: 0 .  vortioxetine HBr (TRINTELLIX) 10 MG TABS, Take 10 mg by mouth 1 day or 1 dose., Disp: , Rfl:   Social History   Tobacco Use  Smoking Status Former Smoker  . Last attempt to quit: 10/23/1969  . Years since quitting: 48.7  Smokeless Tobacco Never Used    No Known Allergies Objective:  There were no vitals filed for this visit. There is no height or weight on file to calculate BMI. Constitutional Well developed. Well nourished.  Vascular Dorsalis pedis pulses palpable bilaterally. Posterior tibial pulses palpable bilaterally. Capillary refill normal to all digits.  No cyanosis or clubbing noted. Pedal hair growth normal.  Neurologic Normal speech. Oriented to person, place, and time. Protective sensation absent  Dermatologic Wound  Location: R leg Wound Base: Granular/Healthy Peri-wound: Normal Exudate: Scant/small amount Serosanguinous exudate Wound Measurements: -9/3: 2x2   Wound Location: L leg Wound Base: Mixed Granular/Fibrotic Necrotic eschar Peri-wound: Calloused Exudate: Moderate amount Serosanguinous exudate Wound Measurements: -9/3: 6.5*7      Orthopedic: No pain to palpation either foot.   Radiographs: None today Assessment:   1. Chronic ulcer of left leg, limited to breakdown of skin ([place])   2. Chronic ulcer of right leg, limited to breakdown of skin [facility])    Plan:  Patient was evaluated and treated and all questions answered.  Ulcer R Leg  -No debridement today. -Dressed with medihoney, DSD. -Updated orders sent to [facility]  Ulcer L Leg -Debridement as below. -Dressed with saline wet to dry. -Discussed with patient that he would benefit from operative debridement of the wound. Patient verbalized understanding.  -Patient has failed local wound care wishes to proceed with surgical intervention. All risks, benefits, and alternatives discussed with patient. No guarantees given. Consent reviewed and signed by patient. -Planned procedures: L Leg D&I, possible Wound VAC -Updated orders sent to [facility].  Procedure: Excisional Debridement of Wound Rationale: Removal of non-viable soft tissue from the wound to promote healing.  Anesthesia: lidocaine solution soak Pre-Debridement Wound Measurements: 6.5 cm x 7 cm x 0.3 cm  Post-Debridement Wound Measurements: 7 cm x 7 cm x 0.3 cm  Type of Debridement: Sharp Excisional Tissue Removed: Non-viable soft tissue Depth of Debridement: subcutaneous tissue. Technique: Sharp excisional debridement to bleeding, viable wound base.  Dressing: Dry, sterile, compression dressing. Disposition: Patient tolerated procedure well. Patient to return in 1 week for follow-up.  No follow-ups on file.

## 2018-06-25 NOTE — Telephone Encounter (Signed)
-----   Message from Evelina Bucy, DPM sent at 06/25/2018  4:45 PM EDT ----- Regarding: [facility] orders Can we send updated orders to [facility] WTD both leg wounds qOD

## 2018-06-25 NOTE — Patient Instructions (Signed)
Pre-Operative Instructions  Congratulations, you have decided to take an important step towards improving your quality of life.  You can be assured that the doctors and staff at [facility] will be with you every step of the way.  Here are some important things you should know:  1. Plan to be at the surgery center/hospital at least 1 (one) hour prior to your scheduled time, unless otherwise directed by the surgical center/hospital staff.  You must have a responsible adult accompany you, remain during the surgery and drive you home.  Make sure you have directions to the surgical center/hospital to ensure you arrive on time. 2. If you are having surgery at [facility] or [facility] hospitals, you will need a copy of your medical history and physical form from your family physician within one month prior to the date of surgery. We will give you a form for your primary physician to complete.  3. We make every effort to accommodate the date you request for surgery.  However, there are times where surgery dates or times have to be moved.  We will contact you as soon as possible if a change in schedule is required.   4. No aspirin/ibuprofen for one week before surgery.  If you are on aspirin, any non-steroidal anti-inflammatory medications (Mobic, Aleve, Ibuprofen) should not be taken seven (7) days prior to your surgery.  You make take Tylenol for pain prior to surgery.  5. Medications - If you are taking daily heart and blood pressure medications, seizure, reflux, allergy, asthma, anxiety, pain or diabetes medications, make sure you notify the surgery center/hospital before the day of surgery so they can tell you which medications you should take or avoid the day of surgery. 6. No food or drink after midnight the night before surgery unless directed otherwise by surgical center/hospital staff. 7. No alcoholic beverages 24-hours prior to surgery.  No smoking 24-hours prior or 24-hours after  surgery. 8. Wear loose pants or shorts. They should be loose enough to fit over bandages, boots, and casts. 9. Don't wear slip-on shoes. Sneakers are preferred. 10. Bring your boot with you to the surgery center/hospital.  Also bring crutches or a walker if your physician has prescribed it for you.  If you do not have this equipment, it will be provided for you after surgery. 11. If you have not been contacted by the surgery center/hospital by the day before your surgery, call to confirm the date and time of your surgery. 12. Leave-time from work may vary depending on the type of surgery you have.  Appropriate arrangements should be made prior to surgery with your employer. 13. Prescriptions will be provided immediately following surgery by your doctor.  Fill these as soon as possible after surgery and take the medication as directed. Pain medications will not be refilled on weekends and must be approved by the doctor. 14. Remove nail polish on the operative foot and avoid getting pedicures prior to surgery. 15. Wash the night before surgery.  The night before surgery wash the foot and leg well with water and the antibacterial soap provided. Be sure to pay special attention to beneath the toenails and in between the toes.  Wash for at least three (3) minutes. Rinse thoroughly with water and dry well with a towel.  Perform this wash unless told not to do so by your physician.  Enclosed: 1 Ice pack (please put in freezer the night before surgery)   1 Hibiclens skin cleaner     Pre-op instructions  If you have any questions regarding the instructions, please do not hesitate to call our office.  [place]: [address], [place], [place] [address] -- [phone]  [place]: [address] [place], [place] [address] -- [phone]  [place]: [address]  [place], [place] [address] -- [phone]  [place]: [address], [place], [place] [address] -- [phone]  Website:  [email or web address] 

## 2018-06-25 NOTE — Telephone Encounter (Signed)
DR. Eleanora Neighbor 06/25/2018 4:45pm orders faxed to [facility].

## 2018-06-27 ENCOUNTER — Telehealth: Payer: Self-pay | Admitting: *Deleted

## 2018-06-27 ENCOUNTER — Ambulatory Visit (INDEPENDENT_AMBULATORY_CARE_PROVIDER_SITE_OTHER): Payer: Medicare HMO | Admitting: *Deleted

## 2018-06-27 DIAGNOSIS — E1140 Type 2 diabetes mellitus with diabetic neuropathy, unspecified: Secondary | ICD-10-CM | POA: Diagnosis not present

## 2018-06-27 DIAGNOSIS — Z4781 Encounter for orthopedic aftercare following surgical amputation: Secondary | ICD-10-CM | POA: Diagnosis not present

## 2018-06-27 DIAGNOSIS — R262 Difficulty in walking, not elsewhere classified: Secondary | ICD-10-CM

## 2018-06-27 DIAGNOSIS — Z794 Long term (current) use of insulin: Secondary | ICD-10-CM

## 2018-06-27 DIAGNOSIS — G6181 Chronic inflammatory demyelinating polyneuritis: Secondary | ICD-10-CM | POA: Diagnosis not present

## 2018-06-27 DIAGNOSIS — S98111A Complete traumatic amputation of right great toe, initial encounter: Secondary | ICD-10-CM

## 2018-06-27 DIAGNOSIS — E1169 Type 2 diabetes mellitus with other specified complication: Secondary | ICD-10-CM | POA: Diagnosis not present

## 2018-06-27 DIAGNOSIS — R2681 Unsteadiness on feet: Secondary | ICD-10-CM | POA: Diagnosis not present

## 2018-06-27 DIAGNOSIS — M2041 Other hammer toe(s) (acquired), right foot: Secondary | ICD-10-CM

## 2018-06-27 DIAGNOSIS — I480 Paroxysmal atrial fibrillation: Secondary | ICD-10-CM | POA: Diagnosis not present

## 2018-06-27 DIAGNOSIS — M6281 Muscle weakness (generalized): Secondary | ICD-10-CM

## 2018-06-27 DIAGNOSIS — L97521 Non-pressure chronic ulcer of other part of left foot limited to breakdown of skin: Secondary | ICD-10-CM | POA: Diagnosis not present

## 2018-06-27 DIAGNOSIS — F319 Bipolar disorder, unspecified: Secondary | ICD-10-CM | POA: Diagnosis not present

## 2018-06-27 DIAGNOSIS — I110 Hypertensive heart disease with heart failure: Secondary | ICD-10-CM | POA: Diagnosis not present

## 2018-06-27 DIAGNOSIS — M86071 Acute hematogenous osteomyelitis, right ankle and foot: Secondary | ICD-10-CM | POA: Diagnosis not present

## 2018-06-27 DIAGNOSIS — M2042 Other hammer toe(s) (acquired), left foot: Secondary | ICD-10-CM

## 2018-06-27 DIAGNOSIS — I5022 Chronic systolic (congestive) heart failure: Secondary | ICD-10-CM | POA: Diagnosis not present

## 2018-06-27 DIAGNOSIS — IMO0002 Reserved for concepts with insufficient information to code with codable children: Secondary | ICD-10-CM

## 2018-06-27 DIAGNOSIS — I739 Peripheral vascular disease, unspecified: Secondary | ICD-10-CM

## 2018-06-27 DIAGNOSIS — G4733 Obstructive sleep apnea (adult) (pediatric): Secondary | ICD-10-CM | POA: Diagnosis not present

## 2018-06-27 DIAGNOSIS — E08621 Diabetes mellitus due to underlying condition with foot ulcer: Secondary | ICD-10-CM

## 2018-06-27 DIAGNOSIS — L97511 Non-pressure chronic ulcer of other part of right foot limited to breakdown of skin: Secondary | ICD-10-CM

## 2018-06-27 NOTE — Patient Instructions (Signed)

## 2018-06-27 NOTE — Telephone Encounter (Signed)
"  I am calling to schedule surgery for Johnathan Wright."  Who is his doctor?  "Dr. March Rummage is his doctor.  I left you a message yesterday."  Has he signed consent forms and where will the surgery be done [facility] or [facility]?  "I am assuming [place].  He saw Dr. March Rummage on Tuesday.  He was given the forms to have filled out by his doctor.  He has an appointment with him on tomorrow."  What date do you have in mind?  "Dr. March Rummage said to do it as soon as possible."  Okay I will try to get it scheduled for September 10.  "I can't do it that date, I'm having surgery myself that date."  I will try for September 9, if I can't get that date, I will try for September 16 or 17.  "You will call me back and let me know?"  Yes, I will call you back.

## 2018-06-27 NOTE — Telephone Encounter (Signed)
I called and spoke to Amy at [facility] she said we can schedule the surgery for 07/01/2018 at 12 N.  She asked me to fax all needed information.  I called and informed Enid Derry that Mr. Pfarr is scheduled for 07/01/2018 at [facility].  She asked for the time.  I told her it will be at 12 noon and that someone would call her from the surgical center and give her the arrival time.    I called and informed Deneise Lever of [facility] that Dr. March Rummage is requesting the [place] at [facility] on 07/01/2018.  He asked me if they had a generator.  I told him I didn't know.  I gave him the phone number, [phone] and told him to ask for Amy.  He called back and stated they do have a generator.  He said he would have everything taken care of for Monday.  I faxed all needed information to Gilliam at [facility] to schedule the surgery.  I faxed clinicals to [facility] to get authorization for the surgery.  I also faxed clinicals to [facility] upon their request to [phone].  Pending authorization number is [id number].  I spoke to [place] A regarding the authorization request.

## 2018-06-28 DIAGNOSIS — I10 Essential (primary) hypertension: Secondary | ICD-10-CM | POA: Diagnosis not present

## 2018-06-28 DIAGNOSIS — E1142 Type 2 diabetes mellitus with diabetic polyneuropathy: Secondary | ICD-10-CM | POA: Diagnosis not present

## 2018-06-28 DIAGNOSIS — R05 Cough: Secondary | ICD-10-CM | POA: Diagnosis not present

## 2018-06-28 DIAGNOSIS — Z01810 Encounter for preprocedural cardiovascular examination: Secondary | ICD-10-CM | POA: Diagnosis not present

## 2018-06-28 NOTE — Telephone Encounter (Signed)
"  This is Pam from [facility].  Did you receive a copy of his history and physical form?"  I did not receive it.  "Can you call and let Becky know at the surgical center if you get it before you leave?"  Yes, I will let her know.  I called Ms. MacIntrye.  She said they forgot to get that form while they were there.  She said she will go by the doctor's office first thing Monday morning to pick it up.  I told her the surgery will not be able to be performed without the history and physical form.  I asked her to call Becky at [facility] to let her know her plan.

## 2018-06-28 NOTE — Telephone Encounter (Signed)
"  This is Becky from the surgical center.  I tried to call Mr. Frankl to give him his time.  I couldn't get a hold of him.  I couldn't leave a voicemail."  I'll try and get in touch with him.  "Can you let me know if you get a hold of him?"  I will let you know.  I left a message for Enid Derry to call Jacqlyn Larsen at [facility] regarding Mr. Yeater's appointment for Monday.    I called and gave Jacqlyn Larsen, Shirley's phone number because she's his caregiver.  The phone number is [phone].  "This is Enid Derry, I just missed your call."  I left you a message to call Becky at the surgical center.  She has been trying to contact Mr. Weitzel and he hasn't answered.  "His phone died.  Have her call me."  I gave her your name and phone number.

## 2018-07-01 ENCOUNTER — Other Ambulatory Visit: Payer: Self-pay | Admitting: Podiatry

## 2018-07-01 DIAGNOSIS — J449 Chronic obstructive pulmonary disease, unspecified: Secondary | ICD-10-CM | POA: Diagnosis not present

## 2018-07-01 DIAGNOSIS — S81802A Unspecified open wound, left lower leg, initial encounter: Secondary | ICD-10-CM | POA: Diagnosis not present

## 2018-07-01 DIAGNOSIS — L97923 Non-pressure chronic ulcer of unspecified part of left lower leg with necrosis of muscle: Secondary | ICD-10-CM

## 2018-07-01 DIAGNOSIS — E559 Vitamin D deficiency, unspecified: Secondary | ICD-10-CM | POA: Diagnosis not present

## 2018-07-01 DIAGNOSIS — S81812A Laceration without foreign body, left lower leg, initial encounter: Secondary | ICD-10-CM | POA: Diagnosis not present

## 2018-07-01 DIAGNOSIS — E782 Mixed hyperlipidemia: Secondary | ICD-10-CM | POA: Diagnosis not present

## 2018-07-01 DIAGNOSIS — I482 Chronic atrial fibrillation: Secondary | ICD-10-CM | POA: Diagnosis not present

## 2018-07-01 DIAGNOSIS — E1142 Type 2 diabetes mellitus with diabetic polyneuropathy: Secondary | ICD-10-CM | POA: Diagnosis not present

## 2018-07-01 DIAGNOSIS — G894 Chronic pain syndrome: Secondary | ICD-10-CM | POA: Diagnosis not present

## 2018-07-01 DIAGNOSIS — I110 Hypertensive heart disease with heart failure: Secondary | ICD-10-CM | POA: Diagnosis not present

## 2018-07-01 DIAGNOSIS — E1121 Type 2 diabetes mellitus with diabetic nephropathy: Secondary | ICD-10-CM | POA: Diagnosis not present

## 2018-07-01 DIAGNOSIS — E1140 Type 2 diabetes mellitus with diabetic neuropathy, unspecified: Secondary | ICD-10-CM | POA: Diagnosis not present

## 2018-07-01 MED ORDER — HYDROCODONE-ACETAMINOPHEN 10-325 MG PO TABS: 1.0000 | ORAL_TABLET | ORAL | 0 refills | Status: DC | PRN

## 2018-07-01 MED ORDER — CLINDAMYCIN HCL 300 MG PO CAPS: 300.0000 mg | ORAL_CAPSULE | Freq: Three times a day (TID) | ORAL | 0 refills | Status: DC

## 2018-07-01 NOTE — Progress Notes (Signed)
Patient underwent outpatient surgery today at [facility].  DOS: 9/9/919 Procedure: Debridement and Irrigation of L Leg wound.

## 2018-07-01 NOTE — Progress Notes (Signed)
Post-op Rx sent to the pharmacy .

## 2018-07-02 ENCOUNTER — Telehealth: Payer: Self-pay | Admitting: Podiatry

## 2018-07-02 DIAGNOSIS — I480 Paroxysmal atrial fibrillation: Secondary | ICD-10-CM | POA: Diagnosis not present

## 2018-07-02 DIAGNOSIS — I5022 Chronic systolic (congestive) heart failure: Secondary | ICD-10-CM | POA: Diagnosis not present

## 2018-07-02 DIAGNOSIS — E1169 Type 2 diabetes mellitus with other specified complication: Secondary | ICD-10-CM | POA: Diagnosis not present

## 2018-07-02 DIAGNOSIS — F319 Bipolar disorder, unspecified: Secondary | ICD-10-CM | POA: Diagnosis not present

## 2018-07-02 DIAGNOSIS — G6181 Chronic inflammatory demyelinating polyneuritis: Secondary | ICD-10-CM | POA: Diagnosis not present

## 2018-07-02 DIAGNOSIS — I110 Hypertensive heart disease with heart failure: Secondary | ICD-10-CM | POA: Diagnosis not present

## 2018-07-02 DIAGNOSIS — Z4781 Encounter for orthopedic aftercare following surgical amputation: Secondary | ICD-10-CM | POA: Diagnosis not present

## 2018-07-02 DIAGNOSIS — G4733 Obstructive sleep apnea (adult) (pediatric): Secondary | ICD-10-CM | POA: Diagnosis not present

## 2018-07-02 DIAGNOSIS — M86071 Acute hematogenous osteomyelitis, right ankle and foot: Secondary | ICD-10-CM | POA: Diagnosis not present

## 2018-07-02 NOTE — Telephone Encounter (Signed)
This is [employer] with [facility]. Johnathan Wright is up for re-certification for home health. I was calling to get orders so that nursing can continue to go out and do his wound care. If you would please give me a call back at [phone]. Thank you.

## 2018-07-02 NOTE — Telephone Encounter (Signed)
I ordered Charity to continue [facility] home health, care to be updated orders after appt 07/11/2018.

## 2018-07-03 DIAGNOSIS — G6181 Chronic inflammatory demyelinating polyneuritis: Secondary | ICD-10-CM | POA: Diagnosis not present

## 2018-07-03 DIAGNOSIS — I480 Paroxysmal atrial fibrillation: Secondary | ICD-10-CM | POA: Diagnosis not present

## 2018-07-03 DIAGNOSIS — E1169 Type 2 diabetes mellitus with other specified complication: Secondary | ICD-10-CM | POA: Diagnosis not present

## 2018-07-03 DIAGNOSIS — F319 Bipolar disorder, unspecified: Secondary | ICD-10-CM | POA: Diagnosis not present

## 2018-07-03 DIAGNOSIS — S81801D Unspecified open wound, right lower leg, subsequent encounter: Secondary | ICD-10-CM | POA: Diagnosis not present

## 2018-07-03 DIAGNOSIS — I5022 Chronic systolic (congestive) heart failure: Secondary | ICD-10-CM | POA: Diagnosis not present

## 2018-07-03 DIAGNOSIS — I110 Hypertensive heart disease with heart failure: Secondary | ICD-10-CM | POA: Diagnosis not present

## 2018-07-03 DIAGNOSIS — S81802D Unspecified open wound, left lower leg, subsequent encounter: Secondary | ICD-10-CM | POA: Diagnosis not present

## 2018-07-03 DIAGNOSIS — M86071 Acute hematogenous osteomyelitis, right ankle and foot: Secondary | ICD-10-CM | POA: Diagnosis not present

## 2018-07-03 NOTE — Telephone Encounter (Signed)
"  I received your message.  You said to just ignore the code [id number] for the negative pressure wound vac, correct?"  Yes, that is correct.  "I will cancel the request.  Codes 40768 and 11046 do not require authorization so they are good."  Okay, thank you.

## 2018-07-03 NOTE — Telephone Encounter (Signed)
"  I am regarding an outpatient authorization request for member, Johnathan Wright.  I just need to verify if you really need the wound pressure therapy because it's still pending?  The other codes do not need authorization.  Please call me back and answer this question.    I attempted to call her back.  I left her a message to disregard the code for the negative pressure wound vac.  I asked her to call if she had any further questions.

## 2018-07-05 ENCOUNTER — Telehealth: Payer: Self-pay | Admitting: *Deleted

## 2018-07-05 DIAGNOSIS — S81802D Unspecified open wound, left lower leg, subsequent encounter: Secondary | ICD-10-CM | POA: Diagnosis not present

## 2018-07-05 DIAGNOSIS — S81801D Unspecified open wound, right lower leg, subsequent encounter: Secondary | ICD-10-CM | POA: Diagnosis not present

## 2018-07-05 DIAGNOSIS — I480 Paroxysmal atrial fibrillation: Secondary | ICD-10-CM | POA: Diagnosis not present

## 2018-07-05 DIAGNOSIS — I110 Hypertensive heart disease with heart failure: Secondary | ICD-10-CM | POA: Diagnosis not present

## 2018-07-05 DIAGNOSIS — G6181 Chronic inflammatory demyelinating polyneuritis: Secondary | ICD-10-CM | POA: Diagnosis not present

## 2018-07-05 DIAGNOSIS — E1169 Type 2 diabetes mellitus with other specified complication: Secondary | ICD-10-CM | POA: Diagnosis not present

## 2018-07-05 DIAGNOSIS — F319 Bipolar disorder, unspecified: Secondary | ICD-10-CM | POA: Diagnosis not present

## 2018-07-05 DIAGNOSIS — I5022 Chronic systolic (congestive) heart failure: Secondary | ICD-10-CM | POA: Diagnosis not present

## 2018-07-05 DIAGNOSIS — M86071 Acute hematogenous osteomyelitis, right ankle and foot: Secondary | ICD-10-CM | POA: Diagnosis not present

## 2018-07-05 NOTE — Telephone Encounter (Signed)
Satira Sark - [facility] states she is calling for [facility] wound care orders, pt had surgery 07/01/2018 with Dr. March Rummage.

## 2018-07-05 NOTE — Telephone Encounter (Signed)
Unna boot left leg; wet to dry right leg.

## 2018-07-05 NOTE — Telephone Encounter (Signed)
I called Dr. Eleanora Neighbor 07/05/2018 10:52am orders to [facility]

## 2018-07-08 ENCOUNTER — Encounter: Payer: Medicare HMO | Admitting: Podiatry

## 2018-07-09 ENCOUNTER — Ambulatory Visit (INDEPENDENT_AMBULATORY_CARE_PROVIDER_SITE_OTHER): Payer: Medicare HMO | Admitting: Podiatry

## 2018-07-09 DIAGNOSIS — E1169 Type 2 diabetes mellitus with other specified complication: Secondary | ICD-10-CM | POA: Diagnosis not present

## 2018-07-09 DIAGNOSIS — I872 Venous insufficiency (chronic) (peripheral): Secondary | ICD-10-CM | POA: Diagnosis not present

## 2018-07-09 DIAGNOSIS — L97911 Non-pressure chronic ulcer of unspecified part of right lower leg limited to breakdown of skin: Secondary | ICD-10-CM | POA: Diagnosis not present

## 2018-07-09 DIAGNOSIS — S81801A Unspecified open wound, right lower leg, initial encounter: Secondary | ICD-10-CM

## 2018-07-09 DIAGNOSIS — L97922 Non-pressure chronic ulcer of unspecified part of left lower leg with fat layer exposed: Secondary | ICD-10-CM | POA: Diagnosis not present

## 2018-07-09 DIAGNOSIS — F319 Bipolar disorder, unspecified: Secondary | ICD-10-CM | POA: Diagnosis not present

## 2018-07-09 DIAGNOSIS — I480 Paroxysmal atrial fibrillation: Secondary | ICD-10-CM | POA: Diagnosis not present

## 2018-07-09 DIAGNOSIS — I110 Hypertensive heart disease with heart failure: Secondary | ICD-10-CM | POA: Diagnosis not present

## 2018-07-09 DIAGNOSIS — G6181 Chronic inflammatory demyelinating polyneuritis: Secondary | ICD-10-CM | POA: Diagnosis not present

## 2018-07-09 DIAGNOSIS — I5022 Chronic systolic (congestive) heart failure: Secondary | ICD-10-CM | POA: Diagnosis not present

## 2018-07-09 DIAGNOSIS — S81801D Unspecified open wound, right lower leg, subsequent encounter: Secondary | ICD-10-CM | POA: Diagnosis not present

## 2018-07-09 DIAGNOSIS — M86071 Acute hematogenous osteomyelitis, right ankle and foot: Secondary | ICD-10-CM | POA: Diagnosis not present

## 2018-07-09 DIAGNOSIS — S81802D Unspecified open wound, left lower leg, subsequent encounter: Secondary | ICD-10-CM | POA: Diagnosis not present

## 2018-07-09 NOTE — Progress Notes (Signed)
Subjective:  Patient ID: Johnathan Wright, male    DOB: [birth year],  MRN: [id number]  No chief complaint on file.  72 y.o. male presents for wound care.  Underwent debridement and irrigation of the left leg 07/01/2018.  Doing well.  Having home health care, and they change the dressing last Friday.  Per his friend Enid Derry the wound is looking better today than it was on Friday  Since last eval reports injury to the right proximal leg.  States that it was a fall prior to receiving his balance braces.  Review of Systems: Negative except as noted in the HPI. Denies N/V/F/Ch.  Past Medical History:  Diagnosis Date  . Atrial fibrillation ([place])   . Cellulitis   . CIDP (chronic inflammatory demyelinating polyneuropathy) ([place])   . Depression   . Diabetes ([place])   . Enlarged heart   . Gout     Current Outpatient Medications:  .  allopurinol (ZYLOPRIM) 100 MG tablet, Take 100 mg by mouth daily., Disp: , Rfl:  .  amoxicillin-clavulanate (AUGMENTIN) 875-125 MG tablet, Take 1 tablet by mouth 2 (two) times daily., Disp: 20 tablet, Rfl: 0 .  ARIPiprazole (ABILIFY) 2 MG tablet, Take 2 mg by mouth daily., Disp: , Rfl:  .  aspirin 81 MG tablet, Take 81 mg by mouth daily., Disp: , Rfl:  .  atorvastatin (LIPITOR) 10 MG tablet, Take 10 mg by mouth daily., Disp: , Rfl:  .  Bilberry 1000 MG CAPS, Take by mouth., Disp: , Rfl:  .  CINNAMON PO, Take by mouth daily., Disp: , Rfl:  .  ciprofloxacin (CIPRO) 500 MG tablet, Take 1 tablet (500 mg total) by mouth 2 (two) times daily., Disp: 28 tablet, Rfl: 0 .  clindamycin (CLEOCIN) 300 MG capsule, Take 1 capsule (300 mg total) by mouth 3 (three) times daily., Disp: 30 capsule, Rfl: 0 .  clotrimazole (LOTRIMIN) 1 % cream, Apply 1 application topically 2 (two) times daily., Disp: , Rfl:  .  collagenase (SANTYL) ointment, Apply 1 application topically daily., Disp: 15 g, Rfl: 5 .  cyanocobalamin (,VITAMIN B-12,) 1000 MCG/ML injection, Inject 1 mL (1,000 mcg total) into  the muscle once. Vitamin B12 1 ml IM daily x 7 days then 1 ml IM weekly x 4 weeks then 1 ml IM monthly x 1 year., Disp: 1 mL, Rfl: 0 .  desvenlafaxine (PRISTIQ) 100 MG 24 hr tablet, Take 100 mg by mouth daily., Disp: , Rfl:  .  fentaNYL (DURAGESIC - DOSED MCG/HR) 50 MCG/HR, Place 50 mcg onto the skin every 3 (three) days., Disp: , Rfl:  .  furosemide (LASIX) 40 MG tablet, Take 40 mg by mouth., Disp: , Rfl:  .  HYDROcodone-acetaminophen (NORCO) 10-325 MG tablet, Take 1 tablet by mouth every 4 (four) hours as needed., Disp: 20 tablet, Rfl: 0 .  Insulin Glargine (TOUJEO SOLOSTAR) 300 UNIT/ML SOPN, Inject into the skin., Disp: , Rfl:  .  L-Arginine 500 MG CAPS, Take by mouth., Disp: , Rfl:  .  Liraglutide (VICTOZA) 18 MG/3ML SOPN, Inject into the skin., Disp: , Rfl:  .  Lutein 40 MG CAPS, Take by mouth., Disp: , Rfl:  .  Lutein 6 MG TABS, Take by mouth., Disp: , Rfl:  .  LYSINE PO, Take 1,000 mg by mouth daily., Disp: , Rfl:  .  metFORMIN (GLUCOPHAGE) 1000 MG tablet, Take 1,000 mg by mouth 2 (two) times daily with a meal., Disp: , Rfl:  .  oxycodone (OXY-IR) 5 MG capsule, Take  5 mg by mouth every 4 (four) hours as needed., Disp: , Rfl:  .  sulfamethoxazole-trimethoprim (BACTRIM) 400-80 MG tablet, Take 1 tablet by mouth 2 (two) times daily., Disp: 28 tablet, Rfl: 0 .  vortioxetine HBr (TRINTELLIX) 10 MG TABS, Take 10 mg by mouth 1 day or 1 dose., Disp: , Rfl:   Social History   Tobacco Use  Smoking Status Former Smoker  . Last attempt to quit: 10/23/1969  . Years since quitting: 48.7  Smokeless Tobacco Never Used    No Known Allergies Objective:  There were no vitals filed for this visit. There is no height or weight on file to calculate BMI. Constitutional Well developed. Well nourished.  Vascular Dorsalis pedis pulses palpable bilaterally. Posterior tibial pulses palpable bilaterally. Capillary refill normal to all digits.  No cyanosis or clubbing noted. Pedal hair growth normal.    Neurologic Normal speech. Oriented to person, place, and time. Protective sensation absent  Dermatologic Wound Location: R leg Wound Base: Epithelialized Peri-wound: Normal Exudate: None Wound Measurements: -9/3: 2x2 -9/17: epithelialized  Wound Location: L leg Wound Base: Mixed Granular/Fibrotic Peri-wound: Intact Exudate: Moderate amount Serosanguinous exudate Wound Measurements: -9/3: 6.5*7 -9/17: 5*6   Left proximal leg ulceration approximately 2 x 2 with fibro-granular base  Orthopedic: No pain to palpation either foot.   Radiographs: None today Assessment:   1. Chronic ulcer of right leg, limited to breakdown of skin ([place])   2. 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 Open wound of right lower leg, initial encounter   4. Venous (peripheral) insufficiency    Plan:  Patient was evaluated and treated and all questions answered.  Ulcer R Leg  -No debridement today. -Epthelialized.  Wound R Proximal Leg s/p mechanical fall -Dressed with silvadene and DSD.  Ulcer L Leg -Debridement as below. -Multilayer compression dressing applied d/t venous insufficiency which otherwise would impede healing -Health care to reapply Unna boot Friday and patient to follow-up next week for reapplication.  Procedure: Excisional Debridement of Wound Rationale: Removal of non-viable soft tissue from the wound to promote healing.  Anesthesia: none Pre-Debridement Wound Measurements: 5 cm x 6 cm x 0.1 cm  Post-Debridement Wound Measurements: 5 cm x 6 cm x 0.2 cm  Type of Debridement: Sharp Excisional Tissue Removed: Non-viable soft tissue Depth of Debridement: subcutaneous tissue. Technique: Sharp excisional debridement to bleeding, viable wound base.  Dressing: Dry, sterile, compression dressing. Disposition: Patient tolerated procedure well. Patient to return in 1 week for follow-up.  Return in about 1 week (around 07/16/2018) for Wound Care,  Left.

## 2018-07-11 DIAGNOSIS — S81801D Unspecified open wound, right lower leg, subsequent encounter: Secondary | ICD-10-CM | POA: Diagnosis not present

## 2018-07-11 DIAGNOSIS — I480 Paroxysmal atrial fibrillation: Secondary | ICD-10-CM | POA: Diagnosis not present

## 2018-07-11 DIAGNOSIS — S81802D Unspecified open wound, left lower leg, subsequent encounter: Secondary | ICD-10-CM | POA: Diagnosis not present

## 2018-07-11 DIAGNOSIS — I110 Hypertensive heart disease with heart failure: Secondary | ICD-10-CM | POA: Diagnosis not present

## 2018-07-11 DIAGNOSIS — M86071 Acute hematogenous osteomyelitis, right ankle and foot: Secondary | ICD-10-CM | POA: Diagnosis not present

## 2018-07-11 DIAGNOSIS — I5022 Chronic systolic (congestive) heart failure: Secondary | ICD-10-CM | POA: Diagnosis not present

## 2018-07-11 DIAGNOSIS — G6181 Chronic inflammatory demyelinating polyneuritis: Secondary | ICD-10-CM | POA: Diagnosis not present

## 2018-07-11 DIAGNOSIS — E1169 Type 2 diabetes mellitus with other specified complication: Secondary | ICD-10-CM | POA: Diagnosis not present

## 2018-07-11 DIAGNOSIS — F319 Bipolar disorder, unspecified: Secondary | ICD-10-CM | POA: Diagnosis not present

## 2018-07-12 DIAGNOSIS — G6181 Chronic inflammatory demyelinating polyneuritis: Secondary | ICD-10-CM | POA: Diagnosis not present

## 2018-07-12 DIAGNOSIS — I5022 Chronic systolic (congestive) heart failure: Secondary | ICD-10-CM | POA: Diagnosis not present

## 2018-07-12 DIAGNOSIS — S81801D Unspecified open wound, right lower leg, subsequent encounter: Secondary | ICD-10-CM | POA: Diagnosis not present

## 2018-07-12 DIAGNOSIS — F319 Bipolar disorder, unspecified: Secondary | ICD-10-CM | POA: Diagnosis not present

## 2018-07-12 DIAGNOSIS — I480 Paroxysmal atrial fibrillation: Secondary | ICD-10-CM | POA: Diagnosis not present

## 2018-07-12 DIAGNOSIS — E1169 Type 2 diabetes mellitus with other specified complication: Secondary | ICD-10-CM | POA: Diagnosis not present

## 2018-07-12 DIAGNOSIS — M86071 Acute hematogenous osteomyelitis, right ankle and foot: Secondary | ICD-10-CM | POA: Diagnosis not present

## 2018-07-12 DIAGNOSIS — I110 Hypertensive heart disease with heart failure: Secondary | ICD-10-CM | POA: Diagnosis not present

## 2018-07-12 DIAGNOSIS — S81802D Unspecified open wound, left lower leg, subsequent encounter: Secondary | ICD-10-CM | POA: Diagnosis not present

## 2018-07-15 ENCOUNTER — Encounter: Payer: Medicare HMO | Admitting: Podiatry

## 2018-07-16 ENCOUNTER — Encounter: Payer: Self-pay | Admitting: Podiatry

## 2018-07-16 ENCOUNTER — Ambulatory Visit (INDEPENDENT_AMBULATORY_CARE_PROVIDER_SITE_OTHER): Payer: Medicare HMO | Admitting: Podiatry

## 2018-07-16 VITALS — BP 107/87 | HR 84 | Temp 98.0°F | Resp 16

## 2018-07-16 DIAGNOSIS — E1169 Type 2 diabetes mellitus with other specified complication: Secondary | ICD-10-CM | POA: Diagnosis not present

## 2018-07-16 DIAGNOSIS — I480 Paroxysmal atrial fibrillation: Secondary | ICD-10-CM | POA: Diagnosis not present

## 2018-07-16 DIAGNOSIS — F319 Bipolar disorder, unspecified: Secondary | ICD-10-CM | POA: Diagnosis not present

## 2018-07-16 DIAGNOSIS — S81801A Unspecified open wound, right lower leg, initial encounter: Secondary | ICD-10-CM

## 2018-07-16 DIAGNOSIS — I5022 Chronic systolic (congestive) heart failure: Secondary | ICD-10-CM | POA: Diagnosis not present

## 2018-07-16 DIAGNOSIS — M86071 Acute hematogenous osteomyelitis, right ankle and foot: Secondary | ICD-10-CM | POA: Diagnosis not present

## 2018-07-16 DIAGNOSIS — S81801D Unspecified open wound, right lower leg, subsequent encounter: Secondary | ICD-10-CM | POA: Diagnosis not present

## 2018-07-16 DIAGNOSIS — I872 Venous insufficiency (chronic) (peripheral): Secondary | ICD-10-CM | POA: Diagnosis not present

## 2018-07-16 DIAGNOSIS — G6181 Chronic inflammatory demyelinating polyneuritis: Secondary | ICD-10-CM | POA: Diagnosis not present

## 2018-07-16 DIAGNOSIS — L97921 Non-pressure chronic ulcer of unspecified part of left lower leg limited to breakdown of skin: Secondary | ICD-10-CM

## 2018-07-16 DIAGNOSIS — I110 Hypertensive heart disease with heart failure: Secondary | ICD-10-CM | POA: Diagnosis not present

## 2018-07-16 DIAGNOSIS — S81802D Unspecified open wound, left lower leg, subsequent encounter: Secondary | ICD-10-CM | POA: Diagnosis not present

## 2018-07-16 NOTE — Progress Notes (Signed)
Subjective:  Patient ID: Johnathan Wright, male    DOB: 06-06-1946,  MRN: [id number]  Chief Complaint  Patient presents with  . Foot Ulcer    F/U Left leg ulcer Pt. stated," it's been draining a lot, but no pain.' Tx: unna boot change -pt denies N/V/F/Ch   72 y.o. male presents for wound care.  States that the left leg is been draining a lot but denies pain.  States that the right leg is feeling much better  Review of Systems: Negative except as noted in the HPI. Denies N/V/F/Ch.  Past Medical History:  Diagnosis Date  . Atrial fibrillation ([place])   . Cellulitis   . CIDP (chronic inflammatory demyelinating polyneuropathy) ([place])   . Depression   . Diabetes ([place])   . Enlarged heart   . Gout     Current Outpatient Medications:  .  allopurinol (ZYLOPRIM) 100 MG tablet, Take 100 mg by mouth daily., Disp: , Rfl:  .  amoxicillin-clavulanate (AUGMENTIN) 875-125 MG tablet, Take 1 tablet by mouth 2 (two) times daily., Disp: 20 tablet, Rfl: 0 .  ARIPiprazole (ABILIFY) 2 MG tablet, Take 2 mg by mouth daily., Disp: , Rfl:  .  aspirin 81 MG tablet, Take 81 mg by mouth daily., Disp: , Rfl:  .  atorvastatin (LIPITOR) 10 MG tablet, Take 10 mg by mouth daily., Disp: , Rfl:  .  Bilberry 1000 MG CAPS, Take by mouth., Disp: , Rfl:  .  CINNAMON PO, Take by mouth daily., Disp: , Rfl:  .  ciprofloxacin (CIPRO) 500 MG tablet, Take 1 tablet (500 mg total) by mouth 2 (two) times daily., Disp: 28 tablet, Rfl: 0 .  clindamycin (CLEOCIN) 300 MG capsule, Take 1 capsule (300 mg total) by mouth 3 (three) times daily., Disp: 30 capsule, Rfl: 0 .  clotrimazole (LOTRIMIN) 1 % cream, Apply 1 application topically 2 (two) times daily., Disp: , Rfl:  .  collagenase (SANTYL) ointment, Apply 1 application topically daily., Disp: 15 g, Rfl: 5 .  cyanocobalamin (,VITAMIN B-12,) 1000 MCG/ML injection, Inject 1 mL (1,000 mcg total) into the muscle once. Vitamin B12 1 ml IM daily x 7 days then 1 ml IM weekly x 4 weeks then 1  ml IM monthly x 1 year., Disp: 1 mL, Rfl: 0 .  desvenlafaxine (PRISTIQ) 100 MG 24 hr tablet, Take 100 mg by mouth daily., Disp: , Rfl:  .  fentaNYL (DURAGESIC - DOSED MCG/HR) 50 MCG/HR, Place 50 mcg onto the skin every 3 (three) days., Disp: , Rfl:  .  furosemide (LASIX) 40 MG tablet, Take 40 mg by mouth., Disp: , Rfl:  .  HYDROcodone-acetaminophen (NORCO) 10-325 MG tablet, Take 1 tablet by mouth every 4 (four) hours as needed., Disp: 20 tablet, Rfl: 0 .  Insulin Glargine (TOUJEO SOLOSTAR) 300 UNIT/ML SOPN, Inject into the skin., Disp: , Rfl:  .  L-Arginine 500 MG CAPS, Take by mouth., Disp: , Rfl:  .  Liraglutide (VICTOZA) 18 MG/3ML SOPN, Inject into the skin., Disp: , Rfl:  .  Lutein 40 MG CAPS, Take by mouth., Disp: , Rfl:  .  Lutein 6 MG TABS, Take by mouth., Disp: , Rfl:  .  LYSINE PO, Take 1,000 mg by mouth daily., Disp: , Rfl:  .  metFORMIN (GLUCOPHAGE) 1000 MG tablet, Take 1,000 mg by mouth 2 (two) times daily with a meal., Disp: , Rfl:  .  oxycodone (OXY-IR) 5 MG capsule, Take 5 mg by mouth every 4 (four) hours as needed., Disp: ,  Rfl:  .  sulfamethoxazole-trimethoprim (BACTRIM) 400-80 MG tablet, Take 1 tablet by mouth 2 (two) times daily., Disp: 28 tablet, Rfl: 0 .  vortioxetine HBr (TRINTELLIX) 10 MG TABS, Take 10 mg by mouth 1 day or 1 dose., Disp: , Rfl:   Social History   Tobacco Use  Smoking Status Former Smoker  . Last attempt to quit: 10/23/1969  . Years since quitting: 48.7  Smokeless Tobacco Never Used    No Known Allergies Objective:   Vitals:   07/16/18 1419  BP: 107/87  Pulse: 84  Resp: 16  Temp: 98 F (36.7 C)   There is no height or weight on file to calculate BMI. Constitutional Well developed. Well nourished.  Vascular Dorsalis pedis pulses palpable bilaterally. Posterior tibial pulses palpable bilaterally. Capillary refill normal to all digits.  No cyanosis or clubbing noted. Pedal hair growth normal.  Neurologic Normal speech. Oriented to person,  place, and time. Protective sensation absent  Dermatologic Wound Location: R leg Wound Base: Epithelialized Peri-wound: Normal Exudate: None Wound Measurements: -9/3: 2x2 -9/17: epithelialized  Wound Location: L leg Wound Base: Mixed Granular/Fibrotic Peri-wound: Intact Exudate: Moderate amount Serosanguinous exudate Wound Measurements: -9/3: 6.5*7 -9/17: 5*6 -9/24: 5*6  Right proximal leg ulceration epithelialized  Orthopedic: No pain to palpation either foot.   Radiographs: None today Assessment:   1. Open wound of right lower leg, initial encounter   2. Chronic ulcer of left leg, limited to breakdown of skin (HCC)   3. Venous (peripheral) insufficiency    Plan:  Patient was evaluated and treated and all questions answered.  Ulcer R Leg  -Healed  Wound R Proximal Leg s/p mechanical fall -Almost completely healed.  Epithelialized. -Dressed with dry sterile dressing.   Ulcer L Leg -Debridement as below. -Unna boot and compressive bandage applied to promote healing as venous stasis is hindrance to healing the ulceration -Advised patient at home health care can change the dressing on Friday  Procedure: Excisional Debridement of Wound Rationale: Removal of non-viable soft tissue from the wound to promote healing.  Anesthesia: none Pre-Debridement Wound Measurements: 5 cm x 6 cm x 0.2 cm  Post-Debridement Wound Measurements: 5 cm x 6 cm x 0.2 cm  Type of Debridement: Sharp Excisional Tissue Removed: Non-viable soft tissue Depth of Debridement: subcutaneous tissue. Technique: Sharp excisional debridement of fibrotic tissue and excess granulation tissue to bleeding, viable wound base.  Dressing: Dry, sterile, compression dressing. Disposition: Patient tolerated procedure well. Patient to return in 1 week for follow-up.   No follow-ups on file.

## 2018-07-18 DIAGNOSIS — F319 Bipolar disorder, unspecified: Secondary | ICD-10-CM | POA: Diagnosis not present

## 2018-07-18 DIAGNOSIS — S81802D Unspecified open wound, left lower leg, subsequent encounter: Secondary | ICD-10-CM | POA: Diagnosis not present

## 2018-07-18 DIAGNOSIS — I5022 Chronic systolic (congestive) heart failure: Secondary | ICD-10-CM | POA: Diagnosis not present

## 2018-07-18 DIAGNOSIS — I480 Paroxysmal atrial fibrillation: Secondary | ICD-10-CM | POA: Diagnosis not present

## 2018-07-18 DIAGNOSIS — S81801D Unspecified open wound, right lower leg, subsequent encounter: Secondary | ICD-10-CM | POA: Diagnosis not present

## 2018-07-18 DIAGNOSIS — E1169 Type 2 diabetes mellitus with other specified complication: Secondary | ICD-10-CM | POA: Diagnosis not present

## 2018-07-18 DIAGNOSIS — I110 Hypertensive heart disease with heart failure: Secondary | ICD-10-CM | POA: Diagnosis not present

## 2018-07-18 DIAGNOSIS — G6181 Chronic inflammatory demyelinating polyneuritis: Secondary | ICD-10-CM | POA: Diagnosis not present

## 2018-07-18 DIAGNOSIS — M86071 Acute hematogenous osteomyelitis, right ankle and foot: Secondary | ICD-10-CM | POA: Diagnosis not present

## 2018-07-19 DIAGNOSIS — F319 Bipolar disorder, unspecified: Secondary | ICD-10-CM | POA: Diagnosis not present

## 2018-07-19 DIAGNOSIS — E1169 Type 2 diabetes mellitus with other specified complication: Secondary | ICD-10-CM | POA: Diagnosis not present

## 2018-07-19 DIAGNOSIS — G6181 Chronic inflammatory demyelinating polyneuritis: Secondary | ICD-10-CM | POA: Diagnosis not present

## 2018-07-19 DIAGNOSIS — S81802D Unspecified open wound, left lower leg, subsequent encounter: Secondary | ICD-10-CM | POA: Diagnosis not present

## 2018-07-19 DIAGNOSIS — I110 Hypertensive heart disease with heart failure: Secondary | ICD-10-CM | POA: Diagnosis not present

## 2018-07-19 DIAGNOSIS — M86071 Acute hematogenous osteomyelitis, right ankle and foot: Secondary | ICD-10-CM | POA: Diagnosis not present

## 2018-07-19 DIAGNOSIS — I5022 Chronic systolic (congestive) heart failure: Secondary | ICD-10-CM | POA: Diagnosis not present

## 2018-07-19 DIAGNOSIS — I480 Paroxysmal atrial fibrillation: Secondary | ICD-10-CM | POA: Diagnosis not present

## 2018-07-19 DIAGNOSIS — S81801D Unspecified open wound, right lower leg, subsequent encounter: Secondary | ICD-10-CM | POA: Diagnosis not present

## 2018-07-22 NOTE — Progress Notes (Signed)
Patient ID: Johnathan Wright, male   DOB: 02/23/1946, 72 y.o.   MRN: [id number]   Patient presents for fitting of Moore balance braces with [facility] Certified Pedorthist. Written and verbal break in instructions given. Patient will follow up in 6 weeks with Dr. March Rummage.   Patient presents for diabetic shoe pick up, shoes are tried on for good fit.  Patient received 1 pair MBB Men - Moore Balance Shoe - [id number] in 15 extra wide and 1 pair custom molded diabetic insert with toe filler right and 3 molded diabetic inserts for the left.  Verbal and written break in and wear instructions given.  Patient will follow up for scheduled routine care.

## 2018-07-23 ENCOUNTER — Ambulatory Visit (INDEPENDENT_AMBULATORY_CARE_PROVIDER_SITE_OTHER): Payer: Medicare HMO | Admitting: Podiatry

## 2018-07-23 DIAGNOSIS — I872 Venous insufficiency (chronic) (peripheral): Secondary | ICD-10-CM | POA: Diagnosis not present

## 2018-07-23 DIAGNOSIS — I5022 Chronic systolic (congestive) heart failure: Secondary | ICD-10-CM | POA: Diagnosis not present

## 2018-07-23 DIAGNOSIS — L97921 Non-pressure chronic ulcer of unspecified part of left lower leg limited to breakdown of skin: Secondary | ICD-10-CM

## 2018-07-23 DIAGNOSIS — G6181 Chronic inflammatory demyelinating polyneuritis: Secondary | ICD-10-CM | POA: Diagnosis not present

## 2018-07-23 DIAGNOSIS — E1169 Type 2 diabetes mellitus with other specified complication: Secondary | ICD-10-CM | POA: Diagnosis not present

## 2018-07-23 DIAGNOSIS — I110 Hypertensive heart disease with heart failure: Secondary | ICD-10-CM | POA: Diagnosis not present

## 2018-07-23 DIAGNOSIS — S81801D Unspecified open wound, right lower leg, subsequent encounter: Secondary | ICD-10-CM | POA: Diagnosis not present

## 2018-07-23 DIAGNOSIS — I480 Paroxysmal atrial fibrillation: Secondary | ICD-10-CM | POA: Diagnosis not present

## 2018-07-23 DIAGNOSIS — F319 Bipolar disorder, unspecified: Secondary | ICD-10-CM | POA: Diagnosis not present

## 2018-07-23 DIAGNOSIS — M86071 Acute hematogenous osteomyelitis, right ankle and foot: Secondary | ICD-10-CM | POA: Diagnosis not present

## 2018-07-23 DIAGNOSIS — S81802D Unspecified open wound, left lower leg, subsequent encounter: Secondary | ICD-10-CM | POA: Diagnosis not present

## 2018-07-23 NOTE — Progress Notes (Signed)
Subjective:  Patient ID: Johnathan Wright, male    DOB: [birth year],  MRN: [id number]  No chief complaint on file.  72 y.o. male presents for wound care.  States that the wound is looking smaller and better.  Last fasting blood sugar 120.  Endorses lots of drainage and swelling of the leg.  Review of Systems: Negative except as noted in the HPI. Denies N/V/F/Ch.  Past Medical History:  Diagnosis Date  . Atrial fibrillation ([place])   . Cellulitis   . CIDP (chronic inflammatory demyelinating polyneuropathy) ([place])   . Depression   . Diabetes ([place])   . Enlarged heart   . Gout     Current Outpatient Medications:  .  allopurinol (ZYLOPRIM) 100 MG tablet, Take 100 mg by mouth daily., Disp: , Rfl:  .  amoxicillin-clavulanate (AUGMENTIN) 875-125 MG tablet, Take 1 tablet by mouth 2 (two) times daily., Disp: 20 tablet, Rfl: 0 .  ARIPiprazole (ABILIFY) 2 MG tablet, Take 2 mg by mouth daily., Disp: , Rfl:  .  aspirin 81 MG tablet, Take 81 mg by mouth daily., Disp: , Rfl:  .  atorvastatin (LIPITOR) 10 MG tablet, Take 10 mg by mouth daily., Disp: , Rfl:  .  Bilberry 1000 MG CAPS, Take by mouth., Disp: , Rfl:  .  CINNAMON PO, Take by mouth daily., Disp: , Rfl:  .  ciprofloxacin (CIPRO) 500 MG tablet, Take 1 tablet (500 mg total) by mouth 2 (two) times daily., Disp: 28 tablet, Rfl: 0 .  clindamycin (CLEOCIN) 300 MG capsule, Take 1 capsule (300 mg total) by mouth 3 (three) times daily., Disp: 30 capsule, Rfl: 0 .  clotrimazole (LOTRIMIN) 1 % cream, Apply 1 application topically 2 (two) times daily., Disp: , Rfl:  .  collagenase (SANTYL) ointment, Apply 1 application topically daily., Disp: 15 g, Rfl: 5 .  cyanocobalamin (,VITAMIN B-12,) 1000 MCG/ML injection, Inject 1 mL (1,000 mcg total) into the muscle once. Vitamin B12 1 ml IM daily x 7 days then 1 ml IM weekly x 4 weeks then 1 ml IM monthly x 1 year., Disp: 1 mL, Rfl: 0 .  desvenlafaxine (PRISTIQ) 100 MG 24 hr tablet, Take 100 mg by mouth daily.,  Disp: , Rfl:  .  fentaNYL (DURAGESIC - DOSED MCG/HR) 50 MCG/HR, Place 50 mcg onto the skin every 3 (three) days., Disp: , Rfl:  .  furosemide (LASIX) 40 MG tablet, Take 40 mg by mouth., Disp: , Rfl:  .  HYDROcodone-acetaminophen (NORCO) 10-325 MG tablet, Take 1 tablet by mouth every 4 (four) hours as needed., Disp: 20 tablet, Rfl: 0 .  Insulin Glargine (TOUJEO SOLOSTAR) 300 UNIT/ML SOPN, Inject into the skin., Disp: , Rfl:  .  L-Arginine 500 MG CAPS, Take by mouth., Disp: , Rfl:  .  Liraglutide (VICTOZA) 18 MG/3ML SOPN, Inject into the skin., Disp: , Rfl:  .  Lutein 40 MG CAPS, Take by mouth., Disp: , Rfl:  .  Lutein 6 MG TABS, Take by mouth., Disp: , Rfl:  .  LYSINE PO, Take 1,000 mg by mouth daily., Disp: , Rfl:  .  metFORMIN (GLUCOPHAGE) 1000 MG tablet, Take 1,000 mg by mouth 2 (two) times daily with a meal., Disp: , Rfl:  .  oxycodone (OXY-IR) 5 MG capsule, Take 5 mg by mouth every 4 (four) hours as needed., Disp: , Rfl:  .  sulfamethoxazole-trimethoprim (BACTRIM) 400-80 MG tablet, Take 1 tablet by mouth 2 (two) times daily., Disp: 28 tablet, Rfl: 0 .  vortioxetine HBr (  TRINTELLIX) 10 MG TABS, Take 10 mg by mouth 1 day or 1 dose., Disp: , Rfl:   Social History   Tobacco Use  Smoking Status Former Smoker  . Last attempt to quit: 10/23/1969  . Years since quitting: 48.7  Smokeless Tobacco Never Used    No Known Allergies Objective:   There were no vitals filed for this visit. There is no height or weight on file to calculate BMI. Constitutional Well developed. Well nourished.  Vascular Dorsalis pedis pulses palpable bilaterally. Posterior tibial pulses palpable bilaterally. Capillary refill normal to all digits.  No cyanosis or clubbing noted. Pedal hair growth normal.  Neurologic Normal speech. Oriented to person, place, and time. Protective sensation absent  Dermatologic Wound Location: R leg Wound Base: Epithelialized Peri-wound: Normal Exudate: None Wound  Measurements: -9/3: 2x2 -9/17: epithelialized  Wound Location: L leg Wound Base: Mixed Granular/Fibrotic Peri-wound: Intact Exudate: Moderate amount Serosanguinous exudate Wound Measurements: -9/3: 6.5*7 -9/17: 5*6 -9/24: 5*6 -10/1: 5*4 Right proximal leg ulceration epithelialized  Orthopedic: No pain to palpation either foot.   Radiographs: None today Assessment:   1. Chronic ulcer of left leg, limited to breakdown of skin ([place])   2. Venous (peripheral) insufficiency    Plan:  Patient was evaluated and treated and all questions answered.  Ulcer L Leg -Debridement as below. -Reapply Unna boot and compressive bandage to promote healing as venous stasis is hindrance to healing the ulceration -Advised patient at home health care can change the dressing on Friday  Procedure: Excisional Debridement of Wound Rationale: Removal of non-viable soft tissue from the wound to promote healing.  Anesthesia: none Pre-Debridement Wound Measurements: 5 cm x 4 cm x 0.2 cm  Post-Debridement Wound Measurements: 5 cm x 4 cm x 0.3 cm  Type of Debridement: Sharp Excisional Tissue Removed: Non-viable soft tissue Depth of Debridement: subcutaneous tissue. Technique: Sharp excisional debridement to bleeding, viable wound base.  Dressing: Dry, sterile, compression dressing. Disposition: Patient tolerated procedure well. Patient to return in 1 week for follow-up.    No follow-ups on file.

## 2018-07-25 DIAGNOSIS — S81801D Unspecified open wound, right lower leg, subsequent encounter: Secondary | ICD-10-CM | POA: Diagnosis not present

## 2018-07-25 DIAGNOSIS — S81802D Unspecified open wound, left lower leg, subsequent encounter: Secondary | ICD-10-CM | POA: Diagnosis not present

## 2018-07-25 DIAGNOSIS — G6181 Chronic inflammatory demyelinating polyneuritis: Secondary | ICD-10-CM | POA: Diagnosis not present

## 2018-07-25 DIAGNOSIS — E1169 Type 2 diabetes mellitus with other specified complication: Secondary | ICD-10-CM | POA: Diagnosis not present

## 2018-07-25 DIAGNOSIS — I5022 Chronic systolic (congestive) heart failure: Secondary | ICD-10-CM | POA: Diagnosis not present

## 2018-07-25 DIAGNOSIS — F319 Bipolar disorder, unspecified: Secondary | ICD-10-CM | POA: Diagnosis not present

## 2018-07-25 DIAGNOSIS — I110 Hypertensive heart disease with heart failure: Secondary | ICD-10-CM | POA: Diagnosis not present

## 2018-07-25 DIAGNOSIS — I480 Paroxysmal atrial fibrillation: Secondary | ICD-10-CM | POA: Diagnosis not present

## 2018-07-25 DIAGNOSIS — M86071 Acute hematogenous osteomyelitis, right ankle and foot: Secondary | ICD-10-CM | POA: Diagnosis not present

## 2018-07-29 DIAGNOSIS — I480 Paroxysmal atrial fibrillation: Secondary | ICD-10-CM | POA: Diagnosis not present

## 2018-07-29 DIAGNOSIS — I110 Hypertensive heart disease with heart failure: Secondary | ICD-10-CM | POA: Diagnosis not present

## 2018-07-29 DIAGNOSIS — I5022 Chronic systolic (congestive) heart failure: Secondary | ICD-10-CM | POA: Diagnosis not present

## 2018-07-29 DIAGNOSIS — S81802D Unspecified open wound, left lower leg, subsequent encounter: Secondary | ICD-10-CM | POA: Diagnosis not present

## 2018-07-29 DIAGNOSIS — F319 Bipolar disorder, unspecified: Secondary | ICD-10-CM | POA: Diagnosis not present

## 2018-07-29 DIAGNOSIS — G6181 Chronic inflammatory demyelinating polyneuritis: Secondary | ICD-10-CM | POA: Diagnosis not present

## 2018-07-29 DIAGNOSIS — S81801D Unspecified open wound, right lower leg, subsequent encounter: Secondary | ICD-10-CM | POA: Diagnosis not present

## 2018-07-29 DIAGNOSIS — E1169 Type 2 diabetes mellitus with other specified complication: Secondary | ICD-10-CM | POA: Diagnosis not present

## 2018-07-29 DIAGNOSIS — M86071 Acute hematogenous osteomyelitis, right ankle and foot: Secondary | ICD-10-CM | POA: Diagnosis not present

## 2018-07-30 ENCOUNTER — Encounter: Payer: Self-pay | Admitting: Podiatry

## 2018-07-30 ENCOUNTER — Telehealth: Payer: Self-pay | Admitting: *Deleted

## 2018-07-30 ENCOUNTER — Ambulatory Visit (INDEPENDENT_AMBULATORY_CARE_PROVIDER_SITE_OTHER): Payer: Medicare HMO | Admitting: Podiatry

## 2018-07-30 VITALS — BP 154/89 | HR 83 | Temp 96.1°F | Resp 16

## 2018-07-30 DIAGNOSIS — E1142 Type 2 diabetes mellitus with diabetic polyneuropathy: Secondary | ICD-10-CM

## 2018-07-30 DIAGNOSIS — L97921 Non-pressure chronic ulcer of unspecified part of left lower leg limited to breakdown of skin: Secondary | ICD-10-CM | POA: Diagnosis not present

## 2018-07-30 DIAGNOSIS — I872 Venous insufficiency (chronic) (peripheral): Secondary | ICD-10-CM

## 2018-07-30 DIAGNOSIS — B351 Tinea unguium: Secondary | ICD-10-CM | POA: Diagnosis not present

## 2018-07-30 DIAGNOSIS — R600 Localized edema: Secondary | ICD-10-CM

## 2018-07-30 NOTE — Telephone Encounter (Signed)
Faxed a copy of Dr. Eleanora Neighbor 07/30/2018 2:28pm orders to [facility].

## 2018-07-30 NOTE — Progress Notes (Signed)
Subjective:  Patient ID: Johnathan Wright, male    DOB: 05/25/1946,  MRN: [id number]  Chief Complaint  Patient presents with  . Foot Ulcer    F/u L leg ulcer -FBS: 104 A1C: "IDK" PCP: [facility] x Sept 6    71 y.o. male presents for wound care.  Thinks the wound is doing better.  Review of Systems: Negative except as noted in the HPI. Denies N/V/F/Ch.  Past Medical History:  Diagnosis Date  . Atrial fibrillation ([place])   . Cellulitis   . CIDP (chronic inflammatory demyelinating polyneuropathy) ([place])   . Depression   . Diabetes ([place])   . Enlarged heart   . Gout     Current Outpatient Medications:  .  allopurinol (ZYLOPRIM) 100 MG tablet, Take 100 mg by mouth daily., Disp: , Rfl:  .  amoxicillin-clavulanate (AUGMENTIN) 875-125 MG tablet, Take 1 tablet by mouth 2 (two) times daily., Disp: 20 tablet, Rfl: 0 .  ARIPiprazole (ABILIFY) 2 MG tablet, Take 2 mg by mouth daily., Disp: , Rfl:  .  aspirin 81 MG tablet, Take 81 mg by mouth daily., Disp: , Rfl:  .  atorvastatin (LIPITOR) 10 MG tablet, Take 10 mg by mouth daily., Disp: , Rfl:  .  Bilberry 1000 MG CAPS, Take by mouth., Disp: , Rfl:  .  CINNAMON PO, Take by mouth daily., Disp: , Rfl:  .  ciprofloxacin (CIPRO) 500 MG tablet, Take 1 tablet (500 mg total) by mouth 2 (two) times daily., Disp: 28 tablet, Rfl: 0 .  clindamycin (CLEOCIN) 300 MG capsule, Take 1 capsule (300 mg total) by mouth 3 (three) times daily., Disp: 30 capsule, Rfl: 0 .  clotrimazole (LOTRIMIN) 1 % cream, Apply 1 application topically 2 (two) times daily., Disp: , Rfl:  .  collagenase (SANTYL) ointment, Apply 1 application topically daily., Disp: 15 g, Rfl: 5 .  cyanocobalamin (,VITAMIN B-12,) 1000 MCG/ML injection, Inject 1 mL (1,000 mcg total) into the muscle once. Vitamin B12 1 ml IM daily x 7 days then 1 ml IM weekly x 4 weeks then 1 ml IM monthly x 1 year., Disp: 1 mL, Rfl: 0 .  desvenlafaxine (PRISTIQ) 100 MG 24 hr tablet, Take 100 mg by mouth daily., Disp: , Rfl:  .   fentaNYL (DURAGESIC - DOSED MCG/HR) 50 MCG/HR, Place 50 mcg onto the skin every 3 (three) days., Disp: , Rfl:  .  furosemide (LASIX) 40 MG tablet, Take 40 mg by mouth., Disp: , Rfl:  .  HYDROcodone-acetaminophen (NORCO) 10-325 MG tablet, Take 1 tablet by mouth every 4 (four) hours as needed., Disp: 20 tablet, Rfl: 0 .  Insulin Glargine (TOUJEO SOLOSTAR) 300 UNIT/ML SOPN, Inject into the skin., Disp: , Rfl:  .  L-Arginine 500 MG CAPS, Take by mouth., Disp: , Rfl:  .  Liraglutide (VICTOZA) 18 MG/3ML SOPN, Inject into the skin., Disp: , Rfl:  .  Lutein 40 MG CAPS, Take by mouth., Disp: , Rfl:  .  Lutein 6 MG TABS, Take by mouth., Disp: , Rfl:  .  LYSINE PO, Take 1,000 mg by mouth daily., Disp: , Rfl:  .  metFORMIN (GLUCOPHAGE) 1000 MG tablet, Take 1,000 mg by mouth 2 (two) times daily with a meal., Disp: , Rfl:  .  oxycodone (OXY-IR) 5 MG capsule, Take 5 mg by mouth every 4 (four) hours as needed., Disp: , Rfl:  .  sulfamethoxazole-trimethoprim (BACTRIM) 400-80 MG tablet, Take 1 tablet by mouth 2 (two) times daily., Disp: 28 tablet, Rfl: 0 .  vortioxetine HBr (TRINTELLIX) 10 MG TABS, Take 10 mg by mouth 1 day or 1 dose., Disp: , Rfl:   Social History   Tobacco Use  Smoking Status Former Smoker  . Last attempt to quit: 10/23/1969  . Years since quitting: 48.8  Smokeless Tobacco Never Used    Allergies  Allergen Reactions  . Other Cough   Objective:   Vitals:   07/30/18 1402  BP: (!) 154/89  Pulse: 83  Resp: 16  Temp: (!) 96.1 F (35.6 C)   There is no height or weight on file to calculate BMI. Constitutional Well developed. Well nourished.  Vascular Dorsalis pedis pulses palpable bilaterally. Posterior tibial pulses palpable bilaterally. Capillary refill normal to all digits.  No cyanosis or clubbing noted. Pedal hair growth normal.  Neurologic Normal speech. Oriented to person, place, and time. Protective sensation absent  Dermatologic Nails thickened, dystrophic,  elongated.  Wound Location: L leg Wound Base: Mixed Granular/Fibrotic Peri-wound: Intact Exudate: Moderate amount Serosanguinous exudate Wound Measurements: -9/3: 6.5*7 -9/17: 5*6 -9/24: 5*6 -10/1: 5*4 -10/8: 4.5*3    Orthopedic: No pain to palpation either foot.   Radiographs: None today Assessment:   1. Chronic ulcer of left leg, limited to breakdown of skin (HCC)   2. Venous (peripheral) insufficiency   3. Localized edema   4. DM type 2 with diabetic peripheral neuropathy ([place])   5. Onychomycosis    Plan:  Patient was evaluated and treated and all questions answered.  Diabetes with DPN, Onychomycosis -Educated on diabetic footcare. Diabetic risk level 3 -Nails x9 debrided sharply and manually with large nail nipper and rotary burr.   Procedure: Nail Debridement Rationale: Patient meets criteria for routine foot care due to amputation history right great toe Type of Debridement: manual, sharp debridement. Instrumentation: Nail nipper, rotary burr. Number of Nails: 9   Ulcer L Leg -Debridement as below. -Dressed with prisma. -Will send updated [place] orders  Procedure: Excisional Debridement of Wound Rationale: Removal of non-viable soft tissue from the wound to promote healing.  Anesthesia: none Pre-Debridement Wound Measurements: 4.5 cm x 3 cm x 0.2 cm  Post-Debridement Wound Measurements: 4.5 cm x 3 cm x 0.2 cm  Type of Debridement: Sharp Excisional Tissue Removed: Non-viable soft tissue Depth of Debridement: subcutaneous tissue. Technique: Sharp excisional debridement to bleeding, viable wound base.  Dressing: Dry, sterile, compression dressing. Disposition: Patient tolerated procedure well. Patient to return in 1 week for follow-up.  Venous Insufficiency L Leg -Multilayer compression wrap applied consisting of Unna, Coban, ACE bandage.   Return in about 1 week (around 08/06/2018) for Wound Care, Left.

## 2018-07-30 NOTE — Telephone Encounter (Signed)
-----   Message from Evelina Bucy, DPM sent at 07/30/2018  2:28 PM EDT ----- Can we send updated [place] orders for patient for Prisma to the L leg wound followed by Multilayer compression wrap. Patient is followed by [facility]

## 2018-08-01 ENCOUNTER — Telehealth: Payer: Self-pay | Admitting: Podiatry

## 2018-08-01 DIAGNOSIS — M86071 Acute hematogenous osteomyelitis, right ankle and foot: Secondary | ICD-10-CM | POA: Diagnosis not present

## 2018-08-01 DIAGNOSIS — G6181 Chronic inflammatory demyelinating polyneuritis: Secondary | ICD-10-CM | POA: Diagnosis not present

## 2018-08-01 DIAGNOSIS — F319 Bipolar disorder, unspecified: Secondary | ICD-10-CM | POA: Diagnosis not present

## 2018-08-01 DIAGNOSIS — I110 Hypertensive heart disease with heart failure: Secondary | ICD-10-CM | POA: Diagnosis not present

## 2018-08-01 DIAGNOSIS — S81802D Unspecified open wound, left lower leg, subsequent encounter: Secondary | ICD-10-CM | POA: Diagnosis not present

## 2018-08-01 DIAGNOSIS — S81801D Unspecified open wound, right lower leg, subsequent encounter: Secondary | ICD-10-CM | POA: Diagnosis not present

## 2018-08-01 DIAGNOSIS — E1169 Type 2 diabetes mellitus with other specified complication: Secondary | ICD-10-CM | POA: Diagnosis not present

## 2018-08-01 DIAGNOSIS — I5022 Chronic systolic (congestive) heart failure: Secondary | ICD-10-CM | POA: Diagnosis not present

## 2018-08-01 DIAGNOSIS — I480 Paroxysmal atrial fibrillation: Secondary | ICD-10-CM | POA: Diagnosis not present

## 2018-08-01 NOTE — Telephone Encounter (Signed)
I informed Johnathan Wright - [facility] that Dr. March Rummage did want the [facility] visits 3x week. Baker Janus states pt is seen on Tuesday next week so will only see once, and I told her it would be okay and I would send a note for pt to be seen on Mondays so he could have care 3 x times a week.

## 2018-08-01 NOTE — Telephone Encounter (Signed)
This is Johnathan Wright with [facility]. I'm calling to confirm the dressing changes. My call back number is [phone].

## 2018-08-03 DIAGNOSIS — I5022 Chronic systolic (congestive) heart failure: Secondary | ICD-10-CM | POA: Diagnosis not present

## 2018-08-03 DIAGNOSIS — E1169 Type 2 diabetes mellitus with other specified complication: Secondary | ICD-10-CM | POA: Diagnosis not present

## 2018-08-03 DIAGNOSIS — M86071 Acute hematogenous osteomyelitis, right ankle and foot: Secondary | ICD-10-CM | POA: Diagnosis not present

## 2018-08-03 DIAGNOSIS — I110 Hypertensive heart disease with heart failure: Secondary | ICD-10-CM | POA: Diagnosis not present

## 2018-08-03 DIAGNOSIS — F319 Bipolar disorder, unspecified: Secondary | ICD-10-CM | POA: Diagnosis not present

## 2018-08-03 DIAGNOSIS — S81801D Unspecified open wound, right lower leg, subsequent encounter: Secondary | ICD-10-CM | POA: Diagnosis not present

## 2018-08-03 DIAGNOSIS — G6181 Chronic inflammatory demyelinating polyneuritis: Secondary | ICD-10-CM | POA: Diagnosis not present

## 2018-08-03 DIAGNOSIS — I480 Paroxysmal atrial fibrillation: Secondary | ICD-10-CM | POA: Diagnosis not present

## 2018-08-03 DIAGNOSIS — S81802D Unspecified open wound, left lower leg, subsequent encounter: Secondary | ICD-10-CM | POA: Diagnosis not present

## 2018-08-05 ENCOUNTER — Ambulatory Visit (INDEPENDENT_AMBULATORY_CARE_PROVIDER_SITE_OTHER): Payer: Medicare HMO | Admitting: Podiatry

## 2018-08-05 DIAGNOSIS — R600 Localized edema: Secondary | ICD-10-CM | POA: Diagnosis not present

## 2018-08-05 DIAGNOSIS — I872 Venous insufficiency (chronic) (peripheral): Secondary | ICD-10-CM

## 2018-08-05 DIAGNOSIS — L97921 Non-pressure chronic ulcer of unspecified part of left lower leg limited to breakdown of skin: Secondary | ICD-10-CM | POA: Diagnosis not present

## 2018-08-05 NOTE — Progress Notes (Signed)
Subjective:  Patient ID: Johnathan Wright, male    DOB: 17-Jan-1946,  MRN: [id number]  No chief complaint on file.  72 y.o. male presents for wound care. HHC coming to dress wound W and F. Wound improving per patient.  Review of Systems: Negative except as noted in the HPI. Denies N/V/F/Ch.  Past Medical History:  Diagnosis Date  . Atrial fibrillation ([place])   . Cellulitis   . CIDP (chronic inflammatory demyelinating polyneuropathy) ([place])   . Depression   . Diabetes ([place])   . Enlarged heart   . Gout     Current Outpatient Medications:  .  allopurinol (ZYLOPRIM) 100 MG tablet, Take 100 mg by mouth daily., Disp: , Rfl:  .  amoxicillin-clavulanate (AUGMENTIN) 875-125 MG tablet, Take 1 tablet by mouth 2 (two) times daily., Disp: 20 tablet, Rfl: 0 .  ARIPiprazole (ABILIFY) 2 MG tablet, Take 2 mg by mouth daily., Disp: , Rfl:  .  aspirin 81 MG tablet, Take 81 mg by mouth daily., Disp: , Rfl:  .  atorvastatin (LIPITOR) 10 MG tablet, Take 10 mg by mouth daily., Disp: , Rfl:  .  Bilberry 1000 MG CAPS, Take by mouth., Disp: , Rfl:  .  CINNAMON PO, Take by mouth daily., Disp: , Rfl:  .  ciprofloxacin (CIPRO) 500 MG tablet, Take 1 tablet (500 mg total) by mouth 2 (two) times daily., Disp: 28 tablet, Rfl: 0 .  clindamycin (CLEOCIN) 300 MG capsule, Take 1 capsule (300 mg total) by mouth 3 (three) times daily., Disp: 30 capsule, Rfl: 0 .  clotrimazole (LOTRIMIN) 1 % cream, Apply 1 application topically 2 (two) times daily., Disp: , Rfl:  .  collagenase (SANTYL) ointment, Apply 1 application topically daily., Disp: 15 g, Rfl: 5 .  cyanocobalamin (,VITAMIN B-12,) 1000 MCG/ML injection, Inject 1 mL (1,000 mcg total) into the muscle once. Vitamin B12 1 ml IM daily x 7 days then 1 ml IM weekly x 4 weeks then 1 ml IM monthly x 1 year., Disp: 1 mL, Rfl: 0 .  desvenlafaxine (PRISTIQ) 100 MG 24 hr tablet, Take 100 mg by mouth daily., Disp: , Rfl:  .  fentaNYL (DURAGESIC - DOSED MCG/HR) 50 MCG/HR, Place 50  mcg onto the skin every 3 (three) days., Disp: , Rfl:  .  furosemide (LASIX) 40 MG tablet, Take 40 mg by mouth., Disp: , Rfl:  .  HYDROcodone-acetaminophen (NORCO) 10-325 MG tablet, Take 1 tablet by mouth every 4 (four) hours as needed., Disp: 20 tablet, Rfl: 0 .  Insulin Glargine (TOUJEO SOLOSTAR) 300 UNIT/ML SOPN, Inject into the skin., Disp: , Rfl:  .  L-Arginine 500 MG CAPS, Take by mouth., Disp: , Rfl:  .  Liraglutide (VICTOZA) 18 MG/3ML SOPN, Inject into the skin., Disp: , Rfl:  .  Lutein 40 MG CAPS, Take by mouth., Disp: , Rfl:  .  Lutein 6 MG TABS, Take by mouth., Disp: , Rfl:  .  LYSINE PO, Take 1,000 mg by mouth daily., Disp: , Rfl:  .  metFORMIN (GLUCOPHAGE) 1000 MG tablet, Take 1,000 mg by mouth 2 (two) times daily with a meal., Disp: , Rfl:  .  oxycodone (OXY-IR) 5 MG capsule, Take 5 mg by mouth every 4 (four) hours as needed., Disp: , Rfl:  .  sulfamethoxazole-trimethoprim (BACTRIM) 400-80 MG tablet, Take 1 tablet by mouth 2 (two) times daily., Disp: 28 tablet, Rfl: 0 .  vortioxetine HBr (TRINTELLIX) 10 MG TABS, Take 10 mg by mouth 1 day or 1 dose.,  Disp: , Rfl:   Social History   Tobacco Use  Smoking Status Former Smoker  . Last attempt to quit: 10/23/1969  . Years since quitting: 48.8  Smokeless Tobacco Never Used    Allergies  Allergen Reactions  . Other Cough   Objective:   There were no vitals filed for this visit. There is no height or weight on file to calculate BMI. Constitutional Well developed. Well nourished.  Vascular Dorsalis pedis pulses palpable bilaterally. Posterior tibial pulses palpable bilaterally. Capillary refill normal to all digits.  No cyanosis or clubbing noted. Pedal hair growth normal.  Neurologic Normal speech. Oriented to person, place, and time. Protective sensation absent  Dermatologic Nails thickened, dystrophic, elongated.  Wound Location: L leg Wound Base: Mixed Granular/Fibrotic Peri-wound: Intact Exudate: Moderate amount  Serosanguinous exudate Wound Measurements: -9/3: 6.5*7 -9/17: 5*6 -9/24: 5*6 -10/1: 5*4 -10/8: 4.5*3 -10/14: 4x3    Orthopedic: No pain to palpation either foot.   Radiographs: None today Assessment:   1. Chronic ulcer of left leg, limited to breakdown of skin (HCC)   2. Venous (peripheral) insufficiency   3. Localized edema    Plan:  Patient was evaluated and treated and all questions answered.  Ulcer L Leg -Debridement as below. -Dressed with prisma.  Procedure: Excisional Debridement of Wound Rationale: Removal of non-viable soft tissue from the wound to promote healing.  Anesthesia: topical lidocaine solution 3 cc. Pre-Debridement Wound Measurements: 3 cm x 4 cm x 0.2 cm  Post-Debridement Wound Measurements: 3 cm x 4 cm x 0.2 cm  Type of Debridement: Sharp Excisional Tissue Removed: Non-viable soft tissue Depth of Debridement: subcutaneous tissue. Technique: Sharp excisional debridement to bleeding, viable wound base.  Dressing: Dry, sterile, compression dressing. Disposition: Patient tolerated procedure well. Patient to return in 1 week for follow-up.  Venous Insufficiency L Leg -Multilayer compression wrap c/o Unna, Coban, ACE applied L leg  Return in about 1 week (around 08/12/2018) for Wound Care.

## 2018-08-06 ENCOUNTER — Encounter: Payer: Medicare HMO | Admitting: Podiatry

## 2018-08-07 DIAGNOSIS — F319 Bipolar disorder, unspecified: Secondary | ICD-10-CM | POA: Diagnosis not present

## 2018-08-07 DIAGNOSIS — I480 Paroxysmal atrial fibrillation: Secondary | ICD-10-CM | POA: Diagnosis not present

## 2018-08-07 DIAGNOSIS — I5022 Chronic systolic (congestive) heart failure: Secondary | ICD-10-CM | POA: Diagnosis not present

## 2018-08-07 DIAGNOSIS — G6181 Chronic inflammatory demyelinating polyneuritis: Secondary | ICD-10-CM | POA: Diagnosis not present

## 2018-08-07 DIAGNOSIS — S81802D Unspecified open wound, left lower leg, subsequent encounter: Secondary | ICD-10-CM | POA: Diagnosis not present

## 2018-08-07 DIAGNOSIS — I110 Hypertensive heart disease with heart failure: Secondary | ICD-10-CM | POA: Diagnosis not present

## 2018-08-07 DIAGNOSIS — S81801D Unspecified open wound, right lower leg, subsequent encounter: Secondary | ICD-10-CM | POA: Diagnosis not present

## 2018-08-07 DIAGNOSIS — E1169 Type 2 diabetes mellitus with other specified complication: Secondary | ICD-10-CM | POA: Diagnosis not present

## 2018-08-07 DIAGNOSIS — M86071 Acute hematogenous osteomyelitis, right ankle and foot: Secondary | ICD-10-CM | POA: Diagnosis not present

## 2018-08-09 DIAGNOSIS — I480 Paroxysmal atrial fibrillation: Secondary | ICD-10-CM | POA: Diagnosis not present

## 2018-08-09 DIAGNOSIS — G6181 Chronic inflammatory demyelinating polyneuritis: Secondary | ICD-10-CM | POA: Diagnosis not present

## 2018-08-09 DIAGNOSIS — I5022 Chronic systolic (congestive) heart failure: Secondary | ICD-10-CM | POA: Diagnosis not present

## 2018-08-09 DIAGNOSIS — S81802D Unspecified open wound, left lower leg, subsequent encounter: Secondary | ICD-10-CM | POA: Diagnosis not present

## 2018-08-09 DIAGNOSIS — M86071 Acute hematogenous osteomyelitis, right ankle and foot: Secondary | ICD-10-CM | POA: Diagnosis not present

## 2018-08-09 DIAGNOSIS — E1169 Type 2 diabetes mellitus with other specified complication: Secondary | ICD-10-CM | POA: Diagnosis not present

## 2018-08-09 DIAGNOSIS — F319 Bipolar disorder, unspecified: Secondary | ICD-10-CM | POA: Diagnosis not present

## 2018-08-09 DIAGNOSIS — I110 Hypertensive heart disease with heart failure: Secondary | ICD-10-CM | POA: Diagnosis not present

## 2018-08-09 DIAGNOSIS — S81801D Unspecified open wound, right lower leg, subsequent encounter: Secondary | ICD-10-CM | POA: Diagnosis not present

## 2018-08-12 ENCOUNTER — Ambulatory Visit (INDEPENDENT_AMBULATORY_CARE_PROVIDER_SITE_OTHER): Payer: Medicare HMO | Admitting: Podiatry

## 2018-08-12 DIAGNOSIS — R600 Localized edema: Secondary | ICD-10-CM

## 2018-08-12 DIAGNOSIS — L97921 Non-pressure chronic ulcer of unspecified part of left lower leg limited to breakdown of skin: Secondary | ICD-10-CM | POA: Diagnosis not present

## 2018-08-12 DIAGNOSIS — I872 Venous insufficiency (chronic) (peripheral): Secondary | ICD-10-CM | POA: Diagnosis not present

## 2018-08-12 NOTE — Progress Notes (Signed)
Subjective:  Patient ID: Johnathan Wright, male    DOB: [birth year],  MRN: [id number]  No chief complaint on file.  72 y.o. male presents for wound care. No new complaints. States the wound is doing better.  Review of Systems: Negative except as noted in the HPI. Denies N/V/F/Ch.  Past Medical History:  Diagnosis Date  . Atrial fibrillation ([place])   . Cellulitis   . CIDP (chronic inflammatory demyelinating polyneuropathy) ([place])   . Depression   . Diabetes ([place])   . Enlarged heart   . Gout     Current Outpatient Medications:  .  allopurinol (ZYLOPRIM) 100 MG tablet, Take 100 mg by mouth daily., Disp: , Rfl:  .  amoxicillin-clavulanate (AUGMENTIN) 875-125 MG tablet, Take 1 tablet by mouth 2 (two) times daily., Disp: 20 tablet, Rfl: 0 .  ARIPiprazole (ABILIFY) 2 MG tablet, Take 2 mg by mouth daily., Disp: , Rfl:  .  aspirin 81 MG tablet, Take 81 mg by mouth daily., Disp: , Rfl:  .  atorvastatin (LIPITOR) 10 MG tablet, Take 10 mg by mouth daily., Disp: , Rfl:  .  Bilberry 1000 MG CAPS, Take by mouth., Disp: , Rfl:  .  CINNAMON PO, Take by mouth daily., Disp: , Rfl:  .  ciprofloxacin (CIPRO) 500 MG tablet, Take 1 tablet (500 mg total) by mouth 2 (two) times daily., Disp: 28 tablet, Rfl: 0 .  clindamycin (CLEOCIN) 300 MG capsule, Take 1 capsule (300 mg total) by mouth 3 (three) times daily., Disp: 30 capsule, Rfl: 0 .  clotrimazole (LOTRIMIN) 1 % cream, Apply 1 application topically 2 (two) times daily., Disp: , Rfl:  .  collagenase (SANTYL) ointment, Apply 1 application topically daily., Disp: 15 g, Rfl: 5 .  cyanocobalamin (,VITAMIN B-12,) 1000 MCG/ML injection, Inject 1 mL (1,000 mcg total) into the muscle once. Vitamin B12 1 ml IM daily x 7 days then 1 ml IM weekly x 4 weeks then 1 ml IM monthly x 1 year., Disp: 1 mL, Rfl: 0 .  desvenlafaxine (PRISTIQ) 100 MG 24 hr tablet, Take 100 mg by mouth daily., Disp: , Rfl:  .  fentaNYL (DURAGESIC - DOSED MCG/HR) 50 MCG/HR, Place 50 mcg onto the  skin every 3 (three) days., Disp: , Rfl:  .  furosemide (LASIX) 40 MG tablet, Take 40 mg by mouth., Disp: , Rfl:  .  HYDROcodone-acetaminophen (NORCO) 10-325 MG tablet, Take 1 tablet by mouth every 4 (four) hours as needed., Disp: 20 tablet, Rfl: 0 .  Insulin Glargine (TOUJEO SOLOSTAR) 300 UNIT/ML SOPN, Inject into the skin., Disp: , Rfl:  .  L-Arginine 500 MG CAPS, Take by mouth., Disp: , Rfl:  .  Liraglutide (VICTOZA) 18 MG/3ML SOPN, Inject into the skin., Disp: , Rfl:  .  Lutein 40 MG CAPS, Take by mouth., Disp: , Rfl:  .  Lutein 6 MG TABS, Take by mouth., Disp: , Rfl:  .  LYSINE PO, Take 1,000 mg by mouth daily., Disp: , Rfl:  .  metFORMIN (GLUCOPHAGE) 1000 MG tablet, Take 1,000 mg by mouth 2 (two) times daily with a meal., Disp: , Rfl:  .  oxycodone (OXY-IR) 5 MG capsule, Take 5 mg by mouth every 4 (four) hours as needed., Disp: , Rfl:  .  sulfamethoxazole-trimethoprim (BACTRIM) 400-80 MG tablet, Take 1 tablet by mouth 2 (two) times daily., Disp: 28 tablet, Rfl: 0 .  vortioxetine HBr (TRINTELLIX) 10 MG TABS, Take 10 mg by mouth 1 day or 1 dose., Disp: , Rfl:  Social History   Tobacco Use  Smoking Status Former Smoker  . Last attempt to quit: 10/23/1969  . Years since quitting: 48.8  Smokeless Tobacco Never Used    Allergies  Allergen Reactions  . Other Cough   Objective:   There were no vitals filed for this visit. There is no height or weight on file to calculate BMI. Constitutional Well developed. Well nourished.  Vascular Dorsalis pedis pulses palpable bilaterally. Posterior tibial pulses palpable bilaterally. Capillary refill normal to all digits.  No cyanosis or clubbing noted. Pedal hair growth normal.  Neurologic Normal speech. Oriented to person, place, and time. Protective sensation absent  Dermatologic Nails thickened, dystrophic, elongated.  Wound Location: L leg Wound Base: Mixed Granular/Fibrotic Peri-wound: Intact Exudate: Moderate amount Serosanguinous  exudate Wound Measurements: -9/3: 6.5*7 -9/17: 5*6 -9/24: 5*6 -10/1: 5*4 -10/8: 4.5*3 -10/14: 4x3 -10/21: 2.5x3.5    Orthopedic: No pain to palpation either foot.   Radiographs: None today Assessment:   1. Chronic ulcer of left leg, limited to breakdown of skin (HCC)   2. Venous (peripheral) insufficiency   3. Localized edema    Plan:  Patient was evaluated and treated and all questions answered.  Ulcer L Leg -Debridement as below. -Dressed with prisma.  Procedure: Excisional Debridement of Wound Rationale: Removal of non-viable soft tissue from the wound to promote healing.  Anesthesia: none Pre-Debridement Wound Measurements: 2.5 cm x 3.5 cm x 0.2 cm  Post-Debridement Wound Measurements: 2.5 cm x 3.5 cm x 0.4 cm  Type of Debridement: Sharp Excisional Tissue Removed: Non-viable soft tissue Depth of Debridement: subcutaneous tissue. Technique: Sharp excisional debridement to bleeding, viable wound base.  Dressing: Dry, sterile, compression dressing. Disposition: Patient tolerated procedure well. Patient to return in 1 week for follow-up.  Venous Insufficiency L Leg -Multilayer compression wrap consisting of Unna, Coban, ACE applied L leg.  No follow-ups on file.

## 2018-08-14 DIAGNOSIS — M86071 Acute hematogenous osteomyelitis, right ankle and foot: Secondary | ICD-10-CM | POA: Diagnosis not present

## 2018-08-14 DIAGNOSIS — S81802D Unspecified open wound, left lower leg, subsequent encounter: Secondary | ICD-10-CM | POA: Diagnosis not present

## 2018-08-14 DIAGNOSIS — I110 Hypertensive heart disease with heart failure: Secondary | ICD-10-CM | POA: Diagnosis not present

## 2018-08-14 DIAGNOSIS — S81801D Unspecified open wound, right lower leg, subsequent encounter: Secondary | ICD-10-CM | POA: Diagnosis not present

## 2018-08-14 DIAGNOSIS — I480 Paroxysmal atrial fibrillation: Secondary | ICD-10-CM | POA: Diagnosis not present

## 2018-08-14 DIAGNOSIS — G6181 Chronic inflammatory demyelinating polyneuritis: Secondary | ICD-10-CM | POA: Diagnosis not present

## 2018-08-14 DIAGNOSIS — I5022 Chronic systolic (congestive) heart failure: Secondary | ICD-10-CM | POA: Diagnosis not present

## 2018-08-14 DIAGNOSIS — E1169 Type 2 diabetes mellitus with other specified complication: Secondary | ICD-10-CM | POA: Diagnosis not present

## 2018-08-14 DIAGNOSIS — F319 Bipolar disorder, unspecified: Secondary | ICD-10-CM | POA: Diagnosis not present

## 2018-08-15 ENCOUNTER — Other Ambulatory Visit: Payer: Self-pay

## 2018-08-15 DIAGNOSIS — R5381 Other malaise: Secondary | ICD-10-CM | POA: Insufficient documentation

## 2018-08-16 DIAGNOSIS — E1169 Type 2 diabetes mellitus with other specified complication: Secondary | ICD-10-CM | POA: Diagnosis not present

## 2018-08-16 DIAGNOSIS — F319 Bipolar disorder, unspecified: Secondary | ICD-10-CM | POA: Diagnosis not present

## 2018-08-16 DIAGNOSIS — G6181 Chronic inflammatory demyelinating polyneuritis: Secondary | ICD-10-CM | POA: Diagnosis not present

## 2018-08-16 DIAGNOSIS — I480 Paroxysmal atrial fibrillation: Secondary | ICD-10-CM | POA: Diagnosis not present

## 2018-08-16 DIAGNOSIS — I110 Hypertensive heart disease with heart failure: Secondary | ICD-10-CM | POA: Diagnosis not present

## 2018-08-16 DIAGNOSIS — M86071 Acute hematogenous osteomyelitis, right ankle and foot: Secondary | ICD-10-CM | POA: Diagnosis not present

## 2018-08-16 DIAGNOSIS — S81801D Unspecified open wound, right lower leg, subsequent encounter: Secondary | ICD-10-CM | POA: Diagnosis not present

## 2018-08-16 DIAGNOSIS — I5022 Chronic systolic (congestive) heart failure: Secondary | ICD-10-CM | POA: Diagnosis not present

## 2018-08-16 DIAGNOSIS — S81802D Unspecified open wound, left lower leg, subsequent encounter: Secondary | ICD-10-CM | POA: Diagnosis not present

## 2018-08-19 ENCOUNTER — Ambulatory Visit (INDEPENDENT_AMBULATORY_CARE_PROVIDER_SITE_OTHER): Payer: Medicare HMO | Admitting: Podiatry

## 2018-08-19 DIAGNOSIS — I872 Venous insufficiency (chronic) (peripheral): Secondary | ICD-10-CM

## 2018-08-19 DIAGNOSIS — L97921 Non-pressure chronic ulcer of unspecified part of left lower leg limited to breakdown of skin: Secondary | ICD-10-CM | POA: Diagnosis not present

## 2018-08-19 NOTE — Progress Notes (Signed)
Subjective:  Patient ID: Johnathan Wright, male    DOB: 1946/07/25,  MRN: [id number]  Chief Complaint  Patient presents with  . Routine Post Op    pov#7 dos 09.09.2019 Debridement and Irrigation of Lt leg wound says he is doing good    72 y.o. male presents for wound care. History as above.  Review of Systems: Negative except as noted in the HPI. Denies N/V/F/Ch.  Past Medical History:  Diagnosis Date  . Atrial fibrillation ([place])   . Cellulitis   . CIDP (chronic inflammatory demyelinating polyneuropathy) ([place])   . Depression   . Diabetes ([place])   . Enlarged heart   . Gout     Current Outpatient Medications:  .  allopurinol (ZYLOPRIM) 100 MG tablet, Take 100 mg by mouth daily., Disp: , Rfl:  .  amoxicillin-clavulanate (AUGMENTIN) 875-125 MG tablet, Take 1 tablet by mouth 2 (two) times daily., Disp: 20 tablet, Rfl: 0 .  ARIPiprazole (ABILIFY) 2 MG tablet, Take 2 mg by mouth daily., Disp: , Rfl:  .  aspirin 81 MG tablet, Take 81 mg by mouth daily., Disp: , Rfl:  .  atorvastatin (LIPITOR) 10 MG tablet, Take 10 mg by mouth daily., Disp: , Rfl:  .  Bilberry 1000 MG CAPS, Take by mouth., Disp: , Rfl:  .  CINNAMON PO, Take by mouth daily., Disp: , Rfl:  .  ciprofloxacin (CIPRO) 500 MG tablet, Take 1 tablet (500 mg total) by mouth 2 (two) times daily., Disp: 28 tablet, Rfl: 0 .  clindamycin (CLEOCIN) 300 MG capsule, Take 1 capsule (300 mg total) by mouth 3 (three) times daily., Disp: 30 capsule, Rfl: 0 .  clotrimazole (LOTRIMIN) 1 % cream, Apply 1 application topically 2 (two) times daily., Disp: , Rfl:  .  collagenase (SANTYL) ointment, Apply 1 application topically daily., Disp: 15 g, Rfl: 5 .  cyanocobalamin (,VITAMIN B-12,) 1000 MCG/ML injection, Inject 1 mL (1,000 mcg total) into the muscle once. Vitamin B12 1 ml IM daily x 7 days then 1 ml IM weekly x 4 weeks then 1 ml IM monthly x 1 year., Disp: 1 mL, Rfl: 0 .  desvenlafaxine (PRISTIQ) 100 MG 24 hr tablet, Take 100 mg by mouth  daily., Disp: , Rfl:  .  fentaNYL (DURAGESIC - DOSED MCG/HR) 50 MCG/HR, Place 50 mcg onto the skin every 3 (three) days., Disp: , Rfl:  .  furosemide (LASIX) 40 MG tablet, Take 40 mg by mouth., Disp: , Rfl:  .  HYDROcodone-acetaminophen (NORCO) 10-325 MG tablet, Take 1 tablet by mouth every 4 (four) hours as needed., Disp: 20 tablet, Rfl: 0 .  Insulin Glargine (TOUJEO SOLOSTAR) 300 UNIT/ML SOPN, Inject into the skin., Disp: , Rfl:  .  L-Arginine 500 MG CAPS, Take by mouth., Disp: , Rfl:  .  Liraglutide (VICTOZA) 18 MG/3ML SOPN, Inject into the skin., Disp: , Rfl:  .  Lutein 40 MG CAPS, Take by mouth., Disp: , Rfl:  .  Lutein 6 MG TABS, Take by mouth., Disp: , Rfl:  .  LYSINE PO, Take 1,000 mg by mouth daily., Disp: , Rfl:  .  Meclizine HCl 25 MG CHEW, Chew by mouth., Disp: , Rfl:  .  metFORMIN (GLUCOPHAGE) 1000 MG tablet, Take 1,000 mg by mouth 2 (two) times daily with a meal., Disp: , Rfl:  .  oxycodone (OXY-IR) 5 MG capsule, Take 5 mg by mouth every 4 (four) hours as needed., Disp: , Rfl:  .  sulfamethoxazole-trimethoprim (BACTRIM) 400-80 MG tablet, Take  1 tablet by mouth 2 (two) times daily., Disp: 28 tablet, Rfl: 0 .  vortioxetine HBr (TRINTELLIX) 10 MG TABS, Take 10 mg by mouth 1 day or 1 dose., Disp: , Rfl:   Social History   Tobacco Use  Smoking Status Former Smoker  . Last attempt to quit: 10/23/1969  . Years since quitting: 48.8  Smokeless Tobacco Never Used    Allergies  Allergen Reactions  . Other Cough   Objective:   There were no vitals filed for this visit. There is no height or weight on file to calculate BMI. Constitutional Well developed. Well nourished.  Vascular Dorsalis pedis pulses palpable bilaterally. Posterior tibial pulses palpable bilaterally. Capillary refill normal to all digits.  No cyanosis or clubbing noted. Pedal hair growth normal.  Neurologic Normal speech. Oriented to person, place, and time. Protective sensation absent  Dermatologic Nails  thickened, dystrophic, elongated.  Wound Location: L leg Wound Base: Mixed Granular/Fibrotic Peri-wound: Intact Exudate: Moderate amount Serosanguinous exudate Wound Measurements: -9/3: 6.5*7 -9/17: 5*6 -9/24: 5*6 -10/1: 5*4 -10/8: 4.5*3 -10/14: 4x3 -10/21: 2.5x3.5 -10/28: 2.5x3.5  Orthopedic: No pain to palpation either foot.   Radiographs: None today Assessment:   1. Chronic ulcer of left leg, limited to breakdown of skin ([place])   2. Venous (peripheral) insufficiency    Plan:  Patient was evaluated and treated and all questions answered.  Ulcer L Leg -Debridement as below. -Dressed with prisma.  Procedure: Excisional Debridement of Wound Rationale: Removal of non-viable soft tissue from the wound to promote healing.  Anesthesia: none Pre-Debridement Wound Measurements: 2.5 cm x 3.5 cm x 0.2 cm  Post-Debridement Wound Measurements: 2.5 cm x 3.5 cm x 0.4 cm  Type of Debridement: Sharp Excisional Tissue Removed: Non-viable soft tissue Depth of Debridement: subcutaneous tissue. Technique: Sharp excisional debridement to bleeding, viable wound base.  Dressing: Dry, sterile, compression dressing. Disposition: Patient tolerated procedure well. Patient to return in 1 week for follow-up.   Venous Insufficiency L Leg -Multilayer compression wrap consisting of Unna, Coban, ACE applied L leg.  No follow-ups on file.

## 2018-08-21 DIAGNOSIS — I480 Paroxysmal atrial fibrillation: Secondary | ICD-10-CM | POA: Diagnosis not present

## 2018-08-21 DIAGNOSIS — S81801D Unspecified open wound, right lower leg, subsequent encounter: Secondary | ICD-10-CM | POA: Diagnosis not present

## 2018-08-21 DIAGNOSIS — F319 Bipolar disorder, unspecified: Secondary | ICD-10-CM | POA: Diagnosis not present

## 2018-08-21 DIAGNOSIS — I5022 Chronic systolic (congestive) heart failure: Secondary | ICD-10-CM | POA: Diagnosis not present

## 2018-08-21 DIAGNOSIS — M86071 Acute hematogenous osteomyelitis, right ankle and foot: Secondary | ICD-10-CM | POA: Diagnosis not present

## 2018-08-21 DIAGNOSIS — I110 Hypertensive heart disease with heart failure: Secondary | ICD-10-CM | POA: Diagnosis not present

## 2018-08-21 DIAGNOSIS — S81802D Unspecified open wound, left lower leg, subsequent encounter: Secondary | ICD-10-CM | POA: Diagnosis not present

## 2018-08-21 DIAGNOSIS — G6181 Chronic inflammatory demyelinating polyneuritis: Secondary | ICD-10-CM | POA: Diagnosis not present

## 2018-08-21 DIAGNOSIS — E1169 Type 2 diabetes mellitus with other specified complication: Secondary | ICD-10-CM | POA: Diagnosis not present

## 2018-08-23 DIAGNOSIS — S81802D Unspecified open wound, left lower leg, subsequent encounter: Secondary | ICD-10-CM | POA: Diagnosis not present

## 2018-08-23 DIAGNOSIS — I480 Paroxysmal atrial fibrillation: Secondary | ICD-10-CM | POA: Diagnosis not present

## 2018-08-23 DIAGNOSIS — S81801D Unspecified open wound, right lower leg, subsequent encounter: Secondary | ICD-10-CM | POA: Diagnosis not present

## 2018-08-23 DIAGNOSIS — I5022 Chronic systolic (congestive) heart failure: Secondary | ICD-10-CM | POA: Diagnosis not present

## 2018-08-23 DIAGNOSIS — G6181 Chronic inflammatory demyelinating polyneuritis: Secondary | ICD-10-CM | POA: Diagnosis not present

## 2018-08-23 DIAGNOSIS — I110 Hypertensive heart disease with heart failure: Secondary | ICD-10-CM | POA: Diagnosis not present

## 2018-08-23 DIAGNOSIS — E1169 Type 2 diabetes mellitus with other specified complication: Secondary | ICD-10-CM | POA: Diagnosis not present

## 2018-08-23 DIAGNOSIS — F319 Bipolar disorder, unspecified: Secondary | ICD-10-CM | POA: Diagnosis not present

## 2018-08-23 DIAGNOSIS — M86071 Acute hematogenous osteomyelitis, right ankle and foot: Secondary | ICD-10-CM | POA: Diagnosis not present

## 2018-08-26 ENCOUNTER — Telehealth: Payer: Self-pay | Admitting: *Deleted

## 2018-08-26 ENCOUNTER — Ambulatory Visit (INDEPENDENT_AMBULATORY_CARE_PROVIDER_SITE_OTHER): Payer: Medicare HMO | Admitting: Podiatry

## 2018-08-26 ENCOUNTER — Encounter: Payer: Self-pay | Admitting: Podiatry

## 2018-08-26 VITALS — BP 143/88 | HR 81 | Temp 97.0°F | Resp 16

## 2018-08-26 DIAGNOSIS — R600 Localized edema: Secondary | ICD-10-CM

## 2018-08-26 DIAGNOSIS — I872 Venous insufficiency (chronic) (peripheral): Secondary | ICD-10-CM

## 2018-08-26 DIAGNOSIS — L97921 Non-pressure chronic ulcer of unspecified part of left lower leg limited to breakdown of skin: Secondary | ICD-10-CM

## 2018-08-26 NOTE — Progress Notes (Signed)
Subjective:  Patient ID: Johnathan Wright, male    DOB: 1945-12-18,  MRN: [id number]  Chief Complaint  Patient presents with  . Foot Ulcer    F/U L leg ulcer Pt. states," it's doing good, it's getting smaller." Tx: unna boot applied by RN -pt denies N/V/F?Ch   72 y.o. male presents for wound care. History as above.  Review of Systems: Negative except as noted in the HPI. Denies N/V/F/Ch.  Past Medical History:  Diagnosis Date  . Atrial fibrillation ([place])   . Cellulitis   . CIDP (chronic inflammatory demyelinating polyneuropathy) ([place])   . Depression   . Diabetes ([place])   . Enlarged heart   . Gout     Current Outpatient Medications:  .  allopurinol (ZYLOPRIM) 100 MG tablet, Take 100 mg by mouth daily., Disp: , Rfl:  .  amoxicillin-clavulanate (AUGMENTIN) 875-125 MG tablet, Take 1 tablet by mouth 2 (two) times daily., Disp: 20 tablet, Rfl: 0 .  ARIPiprazole (ABILIFY) 2 MG tablet, Take 2 mg by mouth daily., Disp: , Rfl:  .  aspirin 81 MG tablet, Take 81 mg by mouth daily., Disp: , Rfl:  .  atorvastatin (LIPITOR) 10 MG tablet, Take 10 mg by mouth daily., Disp: , Rfl:  .  Bilberry 1000 MG CAPS, Take by mouth., Disp: , Rfl:  .  CINNAMON PO, Take by mouth daily., Disp: , Rfl:  .  ciprofloxacin (CIPRO) 500 MG tablet, Take 1 tablet (500 mg total) by mouth 2 (two) times daily., Disp: 28 tablet, Rfl: 0 .  clindamycin (CLEOCIN) 300 MG capsule, Take 1 capsule (300 mg total) by mouth 3 (three) times daily., Disp: 30 capsule, Rfl: 0 .  clotrimazole (LOTRIMIN) 1 % cream, Apply 1 application topically 2 (two) times daily., Disp: , Rfl:  .  collagenase (SANTYL) ointment, Apply 1 application topically daily., Disp: 15 g, Rfl: 5 .  cyanocobalamin (,VITAMIN B-12,) 1000 MCG/ML injection, Inject 1 mL (1,000 mcg total) into the muscle once. Vitamin B12 1 ml IM daily x 7 days then 1 ml IM weekly x 4 weeks then 1 ml IM monthly x 1 year., Disp: 1 mL, Rfl: 0 .  desvenlafaxine (PRISTIQ) 100 MG 24 hr tablet,  Take 100 mg by mouth daily., Disp: , Rfl:  .  fentaNYL (DURAGESIC - DOSED MCG/HR) 50 MCG/HR, Place 50 mcg onto the skin every 3 (three) days., Disp: , Rfl:  .  furosemide (LASIX) 40 MG tablet, Take 40 mg by mouth., Disp: , Rfl:  .  HYDROcodone-acetaminophen (NORCO) 10-325 MG tablet, Take 1 tablet by mouth every 4 (four) hours as needed., Disp: 20 tablet, Rfl: 0 .  Insulin Glargine (TOUJEO SOLOSTAR) 300 UNIT/ML SOPN, Inject into the skin., Disp: , Rfl:  .  L-Arginine 500 MG CAPS, Take by mouth., Disp: , Rfl:  .  Liraglutide (VICTOZA) 18 MG/3ML SOPN, Inject into the skin., Disp: , Rfl:  .  Lutein 40 MG CAPS, Take by mouth., Disp: , Rfl:  .  Lutein 6 MG TABS, Take by mouth., Disp: , Rfl:  .  LYSINE PO, Take 1,000 mg by mouth daily., Disp: , Rfl:  .  Meclizine HCl 25 MG CHEW, Chew by mouth., Disp: , Rfl:  .  metFORMIN (GLUCOPHAGE) 1000 MG tablet, Take 1,000 mg by mouth 2 (two) times daily with a meal., Disp: , Rfl:  .  oxycodone (OXY-IR) 5 MG capsule, Take 5 mg by mouth every 4 (four) hours as needed., Disp: , Rfl:  .  sulfamethoxazole-trimethoprim (BACTRIM)  400-80 MG tablet, Take 1 tablet by mouth 2 (two) times daily., Disp: 28 tablet, Rfl: 0 .  vortioxetine HBr (TRINTELLIX) 10 MG TABS, Take 10 mg by mouth 1 day or 1 dose., Disp: , Rfl:   Social History   Tobacco Use  Smoking Status Former Smoker  . Last attempt to quit: 10/23/1969  . Years since quitting: 48.8  Smokeless Tobacco Never Used    Allergies  Allergen Reactions  . Other Cough   Objective:   Vitals:   08/26/18 1108  BP: (!) 143/88  Pulse: 81  Resp: 16  Temp: (!) 97 F (36.1 C)   There is no height or weight on file to calculate BMI. Constitutional Well developed. Well nourished.  Vascular Dorsalis pedis pulses palpable bilaterally. Posterior tibial pulses palpable bilaterally. Capillary refill normal to all digits.  No cyanosis or clubbing noted. Pedal hair growth normal.  Neurologic Normal speech. Oriented to  person, place, and time. Protective sensation absent  Dermatologic Nails thickened, dystrophic, elongated.  Wound Location: L leg Wound Base: Mixed Granular/Fibrotic Peri-wound: Intact Exudate: Moderate amount Serosanguinous exudate Wound Measurements: -9/3: 6.5*7 -9/17: 5*6 -9/24: 5*6 -10/1: 5*4 -10/8: 4.5*3 -10/14: 4x3 -10/21: 2.5x3.5 -10/28: 2.5x3.5 -11/7: 2.5x3  Orthopedic: No pain to palpation either foot.   Radiographs: None today Assessment:   1. Chronic ulcer of left leg, limited to breakdown of skin (HCC)   2. Venous (peripheral) insufficiency   3. Localized edema    Plan:  Patient was evaluated and treated and all questions answered.  Ulcer L Leg -Debridement as below. -Dressed with medihoney and DSD  Procedure: Excisional Debridement of Wound Rationale: Removal of non-viable soft tissue from the wound to promote healing.  Anesthesia: none Pre-Debridement Wound Measurements: 2 cm x 3 cm x 0.2 cm  Post-Debridement Wound Measurements: 2.5 cm x 3 cm x 0.2 cm  Type of Debridement: Sharp Excisional Tissue Removed: Non-viable soft tissue Depth of Debridement: subcutaneous tissue. Technique: Sharp excisional debridement to bleeding, viable wound base.  Dressing: Dry, sterile, compression dressing. Disposition: Patient tolerated procedure well. Patient to return in 1 week for follow-up.  Venous Insufficiency L Leg -Reapply multilayer compression wrap.  Procedure: Multilayer Compression dressing Rationale: venous insufficiency Technique: Unna boot, cast padding, Coban compression dressing applied Disposition: Patient tolerated procedure well.    Return in about 1 week (around 09/02/2018) for Wound Care, Left.

## 2018-08-26 NOTE — Telephone Encounter (Signed)
-----   Message from Evelina Bucy, DPM sent at 08/26/2018 11:21 AM EST ----- Can we send updated [place] orders to [facility] for Medihoney and Multilayer compression dressing to the left leg twice weekly

## 2018-08-26 NOTE — Telephone Encounter (Signed)
Dr. March Rummage 08/26/2018 11:21am orders faxed to [facility].

## 2018-08-28 DIAGNOSIS — G6181 Chronic inflammatory demyelinating polyneuritis: Secondary | ICD-10-CM | POA: Diagnosis not present

## 2018-08-28 DIAGNOSIS — I110 Hypertensive heart disease with heart failure: Secondary | ICD-10-CM | POA: Diagnosis not present

## 2018-08-28 DIAGNOSIS — E1169 Type 2 diabetes mellitus with other specified complication: Secondary | ICD-10-CM | POA: Diagnosis not present

## 2018-08-28 DIAGNOSIS — M86071 Acute hematogenous osteomyelitis, right ankle and foot: Secondary | ICD-10-CM | POA: Diagnosis not present

## 2018-08-28 DIAGNOSIS — S81802D Unspecified open wound, left lower leg, subsequent encounter: Secondary | ICD-10-CM | POA: Diagnosis not present

## 2018-08-28 DIAGNOSIS — S81801D Unspecified open wound, right lower leg, subsequent encounter: Secondary | ICD-10-CM | POA: Diagnosis not present

## 2018-08-28 DIAGNOSIS — I5022 Chronic systolic (congestive) heart failure: Secondary | ICD-10-CM | POA: Diagnosis not present

## 2018-08-28 DIAGNOSIS — I480 Paroxysmal atrial fibrillation: Secondary | ICD-10-CM | POA: Diagnosis not present

## 2018-08-28 DIAGNOSIS — F319 Bipolar disorder, unspecified: Secondary | ICD-10-CM | POA: Diagnosis not present

## 2018-08-30 DIAGNOSIS — M86071 Acute hematogenous osteomyelitis, right ankle and foot: Secondary | ICD-10-CM | POA: Diagnosis not present

## 2018-08-30 DIAGNOSIS — G6181 Chronic inflammatory demyelinating polyneuritis: Secondary | ICD-10-CM | POA: Diagnosis not present

## 2018-08-30 DIAGNOSIS — I480 Paroxysmal atrial fibrillation: Secondary | ICD-10-CM | POA: Diagnosis not present

## 2018-08-30 DIAGNOSIS — I110 Hypertensive heart disease with heart failure: Secondary | ICD-10-CM | POA: Diagnosis not present

## 2018-08-30 DIAGNOSIS — S81802D Unspecified open wound, left lower leg, subsequent encounter: Secondary | ICD-10-CM | POA: Diagnosis not present

## 2018-08-30 DIAGNOSIS — F319 Bipolar disorder, unspecified: Secondary | ICD-10-CM | POA: Diagnosis not present

## 2018-08-30 DIAGNOSIS — S81801D Unspecified open wound, right lower leg, subsequent encounter: Secondary | ICD-10-CM | POA: Diagnosis not present

## 2018-08-30 DIAGNOSIS — I5022 Chronic systolic (congestive) heart failure: Secondary | ICD-10-CM | POA: Diagnosis not present

## 2018-08-30 DIAGNOSIS — E1169 Type 2 diabetes mellitus with other specified complication: Secondary | ICD-10-CM | POA: Diagnosis not present

## 2018-09-02 ENCOUNTER — Ambulatory Visit (INDEPENDENT_AMBULATORY_CARE_PROVIDER_SITE_OTHER): Payer: Medicare HMO | Admitting: Podiatry

## 2018-09-02 ENCOUNTER — Encounter: Payer: Self-pay | Admitting: Podiatry

## 2018-09-02 VITALS — BP 158/82 | HR 81 | Temp 97.8°F | Resp 15

## 2018-09-02 DIAGNOSIS — R600 Localized edema: Secondary | ICD-10-CM | POA: Diagnosis not present

## 2018-09-02 DIAGNOSIS — L928 Other granulomatous disorders of the skin and subcutaneous tissue: Secondary | ICD-10-CM | POA: Diagnosis not present

## 2018-09-02 DIAGNOSIS — I872 Venous insufficiency (chronic) (peripheral): Secondary | ICD-10-CM

## 2018-09-02 DIAGNOSIS — L97921 Non-pressure chronic ulcer of unspecified part of left lower leg limited to breakdown of skin: Secondary | ICD-10-CM

## 2018-09-02 NOTE — Progress Notes (Signed)
Subjective:  Patient ID: Johnathan Wright, male    DOB: September 16, 1946,  MRN: [id number]  Chief Complaint  Patient presents with  . Foot Ulcer    F/U L leg ulcer Pt. states," it's getting better; smaller." tx: medihoney and unna boot applied by RN -pt denies N/V/F?Ch    72 y.o. male presents for wound care. History as above.  Review of Systems: Negative except as noted in the HPI. Denies N/V/F/Ch.  Past Medical History:  Diagnosis Date  . Atrial fibrillation ([place])   . Cellulitis   . CIDP (chronic inflammatory demyelinating polyneuropathy) ([place])   . Depression   . Diabetes ([place])   . Enlarged heart   . Gout     Current Outpatient Medications:  .  allopurinol (ZYLOPRIM) 100 MG tablet, Take 100 mg by mouth daily., Disp: , Rfl:  .  amoxicillin-clavulanate (AUGMENTIN) 875-125 MG tablet, Take 1 tablet by mouth 2 (two) times daily., Disp: 20 tablet, Rfl: 0 .  ARIPiprazole (ABILIFY) 2 MG tablet, Take 2 mg by mouth daily., Disp: , Rfl:  .  aspirin 81 MG tablet, Take 81 mg by mouth daily., Disp: , Rfl:  .  atorvastatin (LIPITOR) 10 MG tablet, Take 10 mg by mouth daily., Disp: , Rfl:  .  Bilberry 1000 MG CAPS, Take by mouth., Disp: , Rfl:  .  CINNAMON PO, Take by mouth daily., Disp: , Rfl:  .  ciprofloxacin (CIPRO) 500 MG tablet, Take 1 tablet (500 mg total) by mouth 2 (two) times daily., Disp: 28 tablet, Rfl: 0 .  clindamycin (CLEOCIN) 300 MG capsule, Take 1 capsule (300 mg total) by mouth 3 (three) times daily., Disp: 30 capsule, Rfl: 0 .  clotrimazole (LOTRIMIN) 1 % cream, Apply 1 application topically 2 (two) times daily., Disp: , Rfl:  .  collagenase (SANTYL) ointment, Apply 1 application topically daily., Disp: 15 g, Rfl: 5 .  cyanocobalamin (,VITAMIN B-12,) 1000 MCG/ML injection, Inject 1 mL (1,000 mcg total) into the muscle once. Vitamin B12 1 ml IM daily x 7 days then 1 ml IM weekly x 4 weeks then 1 ml IM monthly x 1 year., Disp: 1 mL, Rfl: 0 .  desvenlafaxine (PRISTIQ) 100 MG 24 hr  tablet, Take 100 mg by mouth daily., Disp: , Rfl:  .  fentaNYL (DURAGESIC - DOSED MCG/HR) 50 MCG/HR, Place 50 mcg onto the skin every 3 (three) days., Disp: , Rfl:  .  furosemide (LASIX) 40 MG tablet, Take 40 mg by mouth., Disp: , Rfl:  .  HYDROcodone-acetaminophen (NORCO) 10-325 MG tablet, Take 1 tablet by mouth every 4 (four) hours as needed., Disp: 20 tablet, Rfl: 0 .  Insulin Glargine (TOUJEO SOLOSTAR) 300 UNIT/ML SOPN, Inject into the skin., Disp: , Rfl:  .  L-Arginine 500 MG CAPS, Take by mouth., Disp: , Rfl:  .  Liraglutide (VICTOZA) 18 MG/3ML SOPN, Inject into the skin., Disp: , Rfl:  .  Lutein 40 MG CAPS, Take by mouth., Disp: , Rfl:  .  Lutein 6 MG TABS, Take by mouth., Disp: , Rfl:  .  LYSINE PO, Take 1,000 mg by mouth daily., Disp: , Rfl:  .  Meclizine HCl 25 MG CHEW, Chew by mouth., Disp: , Rfl:  .  metFORMIN (GLUCOPHAGE) 1000 MG tablet, Take 1,000 mg by mouth 2 (two) times daily with a meal., Disp: , Rfl:  .  oxycodone (OXY-IR) 5 MG capsule, Take 5 mg by mouth every 4 (four) hours as needed., Disp: , Rfl:  .  sulfamethoxazole-trimethoprim (  BACTRIM) 400-80 MG tablet, Take 1 tablet by mouth 2 (two) times daily., Disp: 28 tablet, Rfl: 0 .  vortioxetine HBr (TRINTELLIX) 10 MG TABS, Take 10 mg by mouth 1 day or 1 dose., Disp: , Rfl:   Social History   Tobacco Use  Smoking Status Former Smoker  . Last attempt to quit: 10/23/1969  . Years since quitting: 48.8  Smokeless Tobacco Never Used    Allergies  Allergen Reactions  . Other Cough   Objective:   Vitals:   09/02/18 1148  BP: (!) 158/82  Pulse: 81  Resp: 15  Temp: 97.8 F (36.6 C)   There is no height or weight on file to calculate BMI. Constitutional Well developed. Well nourished.  Vascular Dorsalis pedis pulses palpable bilaterally. Posterior tibial pulses palpable bilaterally. Capillary refill normal to all digits.  No cyanosis or clubbing noted. Pedal hair growth normal.  Neurologic Normal speech. Oriented  to person, place, and time. Protective sensation absent  Dermatologic Nails thickened, dystrophic, elongated.  Wound Location: L leg Wound Base: Mixed Granular/Fibrotic Peri-wound: Intact Exudate: Moderate amount Serosanguinous exudate Wound Measurements: -9/3: 6.5*7 -9/17: 5*6 -9/24: 5*6 -10/1: 5*4 -10/8: 4.5*3 -10/14: 4x3 -10/21: 2.5x3.5 -10/28: 2.5x3.5 -11/7: 2.5x3 11/11: 2x3  Orthopedic: No pain to palpation either foot.   Radiographs: None today Assessment:   1. Chronic ulcer of left leg, limited to breakdown of skin (HCC)   2. Venous (peripheral) insufficiency   3. Other granulomatous disorders of the skin and subcutaneous tissue   4. Localized edema    Plan:  Patient was evaluated and treated and all questions answered.  Ulcer L Leg -Excess granulation tissue and wound base cauterized to promote healing  Procedure: Chemical Cauterization of Granulation Tissue Rationale: Cauterize granular wound base to promote healing.  Wound Measurements: 2 cm x 3 cm   Instrumentation: Silver nitrate stick x3 Dressing: Dry, sterile, compression dressing. Disposition: Patient tolerated procedure well. Patient to return in 1 week for follow-up.  Venous Insufficiency L Leg -Reapply multilayer compression wrap.  Procedure: Multilayer Compression dressing Rationale: venous insufficiency Technique: Unna boot, cast padding, Coban compression dressing applied Disposition: Patient tolerated procedure well.   Return in about 1 week (around 09/09/2018) for Wound Care, Left.

## 2018-09-04 DIAGNOSIS — I442 Atrioventricular block, complete: Secondary | ICD-10-CM | POA: Diagnosis not present

## 2018-09-04 DIAGNOSIS — I5022 Chronic systolic (congestive) heart failure: Secondary | ICD-10-CM | POA: Diagnosis not present

## 2018-09-04 DIAGNOSIS — J9811 Atelectasis: Secondary | ICD-10-CM | POA: Diagnosis not present

## 2018-09-04 DIAGNOSIS — E1165 Type 2 diabetes mellitus with hyperglycemia: Secondary | ICD-10-CM | POA: Diagnosis not present

## 2018-09-04 DIAGNOSIS — I361 Nonrheumatic tricuspid (valve) insufficiency: Secondary | ICD-10-CM | POA: Diagnosis not present

## 2018-09-04 DIAGNOSIS — G6181 Chronic inflammatory demyelinating polyneuritis: Secondary | ICD-10-CM | POA: Diagnosis not present

## 2018-09-04 DIAGNOSIS — I480 Paroxysmal atrial fibrillation: Secondary | ICD-10-CM | POA: Diagnosis not present

## 2018-09-04 DIAGNOSIS — L03116 Cellulitis of left lower limb: Secondary | ICD-10-CM | POA: Diagnosis not present

## 2018-09-04 DIAGNOSIS — R0600 Dyspnea, unspecified: Secondary | ICD-10-CM | POA: Diagnosis not present

## 2018-09-04 DIAGNOSIS — E1142 Type 2 diabetes mellitus with diabetic polyneuropathy: Secondary | ICD-10-CM | POA: Diagnosis not present

## 2018-09-04 DIAGNOSIS — E1121 Type 2 diabetes mellitus with diabetic nephropathy: Secondary | ICD-10-CM | POA: Diagnosis not present

## 2018-09-04 DIAGNOSIS — S81802D Unspecified open wound, left lower leg, subsequent encounter: Secondary | ICD-10-CM | POA: Diagnosis not present

## 2018-09-04 DIAGNOSIS — E1169 Type 2 diabetes mellitus with other specified complication: Secondary | ICD-10-CM | POA: Diagnosis not present

## 2018-09-04 DIAGNOSIS — Z982 Presence of cerebrospinal fluid drainage device: Secondary | ICD-10-CM | POA: Diagnosis not present

## 2018-09-04 DIAGNOSIS — G4733 Obstructive sleep apnea (adult) (pediatric): Secondary | ICD-10-CM | POA: Diagnosis not present

## 2018-09-04 DIAGNOSIS — R001 Bradycardia, unspecified: Secondary | ICD-10-CM | POA: Diagnosis not present

## 2018-09-04 DIAGNOSIS — F319 Bipolar disorder, unspecified: Secondary | ICD-10-CM | POA: Diagnosis not present

## 2018-09-04 DIAGNOSIS — E1101 Type 2 diabetes mellitus with hyperosmolarity with coma: Secondary | ICD-10-CM | POA: Diagnosis not present

## 2018-09-04 DIAGNOSIS — M86071 Acute hematogenous osteomyelitis, right ankle and foot: Secondary | ICD-10-CM | POA: Diagnosis not present

## 2018-09-04 DIAGNOSIS — I517 Cardiomegaly: Secondary | ICD-10-CM | POA: Diagnosis not present

## 2018-09-04 DIAGNOSIS — I110 Hypertensive heart disease with heart failure: Secondary | ICD-10-CM | POA: Diagnosis not present

## 2018-09-05 ENCOUNTER — Other Ambulatory Visit: Payer: Self-pay

## 2018-09-05 ENCOUNTER — Encounter (HOSPITAL_COMMUNITY): Payer: Self-pay | Admitting: *Deleted

## 2018-09-05 ENCOUNTER — Inpatient Hospital Stay (HOSPITAL_COMMUNITY)
Admission: AD | Admit: 2018-09-05 | Discharge: 2018-09-10 | DRG: 224 | Disposition: A | Discharge status: Home Health | Payer: Medicare HMO | Source: Other Acute Inpatient Hospital | Attending: Internal Medicine | Admitting: Internal Medicine

## 2018-09-05 DIAGNOSIS — I447 Left bundle-branch block, unspecified: Secondary | ICD-10-CM | POA: Diagnosis not present

## 2018-09-05 DIAGNOSIS — F329 Major depressive disorder, single episode, unspecified: Secondary | ICD-10-CM | POA: Diagnosis present

## 2018-09-05 DIAGNOSIS — E785 Hyperlipidemia, unspecified: Secondary | ICD-10-CM | POA: Diagnosis not present

## 2018-09-05 DIAGNOSIS — I517 Cardiomegaly: Secondary | ICD-10-CM | POA: Diagnosis not present

## 2018-09-05 DIAGNOSIS — Z7982 Long term (current) use of aspirin: Secondary | ICD-10-CM

## 2018-09-05 DIAGNOSIS — I420 Dilated cardiomyopathy: Secondary | ICD-10-CM | POA: Diagnosis present

## 2018-09-05 DIAGNOSIS — I2510 Atherosclerotic heart disease of native coronary artery without angina pectoris: Secondary | ICD-10-CM | POA: Diagnosis present

## 2018-09-05 DIAGNOSIS — I361 Nonrheumatic tricuspid (valve) insufficiency: Secondary | ICD-10-CM | POA: Diagnosis not present

## 2018-09-05 DIAGNOSIS — Z8249 Family history of ischemic heart disease and other diseases of the circulatory system: Secondary | ICD-10-CM | POA: Diagnosis not present

## 2018-09-05 DIAGNOSIS — I483 Typical atrial flutter: Principal | ICD-10-CM | POA: Diagnosis present

## 2018-09-05 DIAGNOSIS — I8311 Varicose veins of right lower extremity with inflammation: Secondary | ICD-10-CM | POA: Diagnosis present

## 2018-09-05 DIAGNOSIS — Z6841 Body Mass Index (BMI) 40.0 and over, adult: Secondary | ICD-10-CM | POA: Diagnosis not present

## 2018-09-05 DIAGNOSIS — I480 Paroxysmal atrial fibrillation: Secondary | ICD-10-CM | POA: Diagnosis not present

## 2018-09-05 DIAGNOSIS — Z794 Long term (current) use of insulin: Secondary | ICD-10-CM | POA: Diagnosis not present

## 2018-09-05 DIAGNOSIS — R001 Bradycardia, unspecified: Secondary | ICD-10-CM

## 2018-09-05 DIAGNOSIS — Z95810 Presence of automatic (implantable) cardiac defibrillator: Secondary | ICD-10-CM

## 2018-09-05 DIAGNOSIS — I5022 Chronic systolic (congestive) heart failure: Secondary | ICD-10-CM | POA: Diagnosis not present

## 2018-09-05 DIAGNOSIS — Z79891 Long term (current) use of opiate analgesic: Secondary | ICD-10-CM

## 2018-09-05 DIAGNOSIS — I4892 Unspecified atrial flutter: Secondary | ICD-10-CM | POA: Diagnosis not present

## 2018-09-05 DIAGNOSIS — M109 Gout, unspecified: Secondary | ICD-10-CM | POA: Diagnosis not present

## 2018-09-05 DIAGNOSIS — I5043 Acute on chronic combined systolic (congestive) and diastolic (congestive) heart failure: Secondary | ICD-10-CM | POA: Diagnosis present

## 2018-09-05 DIAGNOSIS — I272 Pulmonary hypertension, unspecified: Secondary | ICD-10-CM | POA: Diagnosis not present

## 2018-09-05 DIAGNOSIS — I890 Lymphedema, not elsewhere classified: Secondary | ICD-10-CM | POA: Diagnosis not present

## 2018-09-05 DIAGNOSIS — I5042 Chronic combined systolic (congestive) and diastolic (congestive) heart failure: Secondary | ICD-10-CM | POA: Diagnosis not present

## 2018-09-05 DIAGNOSIS — R0600 Dyspnea, unspecified: Secondary | ICD-10-CM | POA: Diagnosis not present

## 2018-09-05 DIAGNOSIS — Z79899 Other long term (current) drug therapy: Secondary | ICD-10-CM | POA: Diagnosis not present

## 2018-09-05 DIAGNOSIS — I4891 Unspecified atrial fibrillation: Secondary | ICD-10-CM | POA: Diagnosis not present

## 2018-09-05 DIAGNOSIS — G4733 Obstructive sleep apnea (adult) (pediatric): Secondary | ICD-10-CM | POA: Diagnosis not present

## 2018-09-05 DIAGNOSIS — J90 Pleural effusion, not elsewhere classified: Secondary | ICD-10-CM | POA: Diagnosis not present

## 2018-09-05 DIAGNOSIS — I442 Atrioventricular block, complete: Secondary | ICD-10-CM | POA: Diagnosis present

## 2018-09-05 DIAGNOSIS — Z87891 Personal history of nicotine dependence: Secondary | ICD-10-CM

## 2018-09-05 DIAGNOSIS — R1310 Dysphagia, unspecified: Secondary | ICD-10-CM | POA: Diagnosis present

## 2018-09-05 DIAGNOSIS — I081 Rheumatic disorders of both mitral and tricuspid valves: Secondary | ICD-10-CM | POA: Diagnosis not present

## 2018-09-05 DIAGNOSIS — I8312 Varicose veins of left lower extremity with inflammation: Secondary | ICD-10-CM | POA: Diagnosis present

## 2018-09-05 DIAGNOSIS — S81802A Unspecified open wound, left lower leg, initial encounter: Secondary | ICD-10-CM | POA: Diagnosis present

## 2018-09-05 DIAGNOSIS — G6181 Chronic inflammatory demyelinating polyneuritis: Secondary | ICD-10-CM | POA: Diagnosis not present

## 2018-09-05 DIAGNOSIS — Z89411 Acquired absence of right great toe: Secondary | ICD-10-CM

## 2018-09-05 DIAGNOSIS — N5089 Other specified disorders of the male genital organs: Secondary | ICD-10-CM | POA: Diagnosis present

## 2018-09-05 DIAGNOSIS — E1142 Type 2 diabetes mellitus with diabetic polyneuropathy: Secondary | ICD-10-CM | POA: Diagnosis present

## 2018-09-05 DIAGNOSIS — E119 Type 2 diabetes mellitus without complications: Secondary | ICD-10-CM

## 2018-09-05 DIAGNOSIS — I371 Nonrheumatic pulmonary valve insufficiency: Secondary | ICD-10-CM | POA: Diagnosis not present

## 2018-09-05 DIAGNOSIS — E1165 Type 2 diabetes mellitus with hyperglycemia: Secondary | ICD-10-CM | POA: Diagnosis not present

## 2018-09-05 DIAGNOSIS — W19XXXA Unspecified fall, initial encounter: Secondary | ICD-10-CM | POA: Diagnosis present

## 2018-09-05 DIAGNOSIS — I8390 Asymptomatic varicose veins of unspecified lower extremity: Secondary | ICD-10-CM | POA: Diagnosis present

## 2018-09-05 DIAGNOSIS — J9811 Atelectasis: Secondary | ICD-10-CM | POA: Diagnosis not present

## 2018-09-05 DIAGNOSIS — I878 Other specified disorders of veins: Secondary | ICD-10-CM | POA: Diagnosis present

## 2018-09-05 DIAGNOSIS — I428 Other cardiomyopathies: Secondary | ICD-10-CM | POA: Diagnosis not present

## 2018-09-05 DIAGNOSIS — E1101 Type 2 diabetes mellitus with hyperosmolarity with coma: Secondary | ICD-10-CM | POA: Diagnosis not present

## 2018-09-05 LAB — CBC
HCT: 44 % (ref 39.0–52.0)
Hemoglobin: 14.2 g/dL (ref 13.0–17.0)
MCH: 29.2 pg (ref 26.0–34.0)
MCHC: 32.3 g/dL (ref 30.0–36.0)
MCV: 90.5 fL (ref 80.0–100.0)
Platelets: 157 10*3/uL (ref 150–400)
RBC: 4.86 MIL/uL (ref 4.22–5.81)
RDW: 12.9 % (ref 11.5–15.5)
WBC: 7.6 10*3/uL (ref 4.0–10.5)
nRBC: 0 % (ref 0.0–0.2)

## 2018-09-05 LAB — TROPONIN I: Troponin I: 0.04 ng/mL (ref <0.03)

## 2018-09-05 LAB — GLUCOSE, CAPILLARY: Glucose-Capillary: 138 mg/dL — ABNORMAL HIGH (ref 70–99)

## 2018-09-05 LAB — CREATININE, SERUM
Creatinine, Ser: 1.26 mg/dL — ABNORMAL HIGH (ref 0.61–1.24)
GFR calc Af Amer: >60 mL/min (ref >60)
GFR calc non Af Amer: 54 mL/min — ABNORMAL LOW (ref >60)

## 2018-09-05 LAB — MRSA PCR SCREENING: MRSA by PCR: NEGATIVE

## 2018-09-05 LAB — BRAIN NATRIURETIC PEPTIDE: B Natriuretic Peptide: 87.7 pg/mL (ref 0.0–100.0)

## 2018-09-05 MED ORDER — ASPIRIN EC 81 MG PO TBEC
81.0000 mg | DELAYED_RELEASE_TABLET | Freq: Every day | ORAL | Status: DC
  Administered 2018-09-06 – 2018-09-08 (×3): 81 mg via ORAL
  Filled 2018-09-05 (×3): qty 1

## 2018-09-05 MED ORDER — NITROGLYCERIN 0.4 MG SL SUBL: 0.4000 mg | SUBLINGUAL_TABLET | SUBLINGUAL | Status: DC | PRN

## 2018-09-05 MED ORDER — ACETAMINOPHEN 325 MG PO TABS
650.0000 mg | ORAL_TABLET | ORAL | Status: DC | PRN
  Administered 2018-09-06: 650 mg via ORAL

## 2018-09-05 MED ORDER — HYDROCODONE-ACETAMINOPHEN 10-325 MG PO TABS: 1.0000 | ORAL_TABLET | ORAL | Status: DC | PRN

## 2018-09-05 MED ORDER — VORTIOXETINE HBR 20 MG PO TABS
20.0000 mg | ORAL_TABLET | Freq: Every day | ORAL | Status: DC
  Administered 2018-09-06 – 2018-09-10 (×5): 20 mg via ORAL
  Filled 2018-09-05 (×5): qty 1

## 2018-09-05 MED ORDER — VORTIOXETINE HBR 10 MG PO TABS: 10.0000 mg | ORAL_TABLET | ORAL | Status: DC

## 2018-09-05 MED ORDER — ONDANSETRON HCL 4 MG/2ML IJ SOLN: 4.0000 mg | Freq: Four times a day (QID) | INTRAMUSCULAR | Status: DC | PRN

## 2018-09-05 MED ORDER — ATORVASTATIN CALCIUM 10 MG PO TABS
10.0000 mg | ORAL_TABLET | Freq: Every day | ORAL | Status: DC
  Administered 2018-09-06 – 2018-09-10 (×5): 10 mg via ORAL
  Filled 2018-09-05 (×5): qty 1

## 2018-09-05 MED ORDER — HEPARIN BOLUS VIA INFUSION
3700.0000 [IU] | Freq: Once | INTRAVENOUS | Status: AC
  Administered 2018-09-05: 3700 [IU] via INTRAVENOUS
  Filled 2018-09-05: qty 3700

## 2018-09-05 MED ORDER — DESVENLAFAXINE SUCCINATE ER 100 MG PO TB24: 100.0000 mg | ORAL_TABLET | Freq: Every day | ORAL | Status: DC

## 2018-09-05 MED ORDER — ALLOPURINOL 100 MG PO TABS
100.0000 mg | ORAL_TABLET | Freq: Every day | ORAL | Status: DC
  Administered 2018-09-06 – 2018-09-10 (×5): 100 mg via ORAL
  Filled 2018-09-05 (×5): qty 1

## 2018-09-05 MED ORDER — ARIPIPRAZOLE 2 MG PO TABS
2.0000 mg | ORAL_TABLET | Freq: Every day | ORAL | Status: DC
  Administered 2018-09-06 – 2018-09-10 (×5): 2 mg via ORAL
  Filled 2018-09-05 (×5): qty 1

## 2018-09-05 MED ORDER — ASPIRIN EC 81 MG PO TBEC: 81.0000 mg | DELAYED_RELEASE_TABLET | Freq: Every day | ORAL | Status: DC

## 2018-09-05 MED ORDER — HEPARIN (PORCINE) 25000 UT/250ML-% IV SOLN
1900.0000 [IU]/h | INTRAVENOUS | Status: DC
  Administered 2018-09-05: 1700 [IU]/h via INTRAVENOUS
  Filled 2018-09-05 (×2): qty 250

## 2018-09-05 MED ORDER — HEPARIN SODIUM (PORCINE) 5000 UNIT/ML IJ SOLN: 5000.0000 [IU] | Freq: Three times a day (TID) | INTRAMUSCULAR | Status: DC

## 2018-09-05 MED ORDER — INSULIN ASPART 100 UNIT/ML ~~LOC~~ SOLN
0.0000 [IU] | Freq: Three times a day (TID) | SUBCUTANEOUS | Status: DC
  Administered 2018-09-06 – 2018-09-10 (×8): 2 [IU] via SUBCUTANEOUS
  Administered 2018-09-10: 3 [IU] via SUBCUTANEOUS

## 2018-09-05 MED ORDER — LYSINE 1000 MG PO TABS: 1000.0000 mg | ORAL_TABLET | Freq: Every day | ORAL | Status: DC

## 2018-09-05 NOTE — H&P (Signed)
ADMISSION HISTORY & PHYSICAL  Patient Name: Johnathan Wright Date of Encounter: 09/05/2018 Primary Care Physician: Johnathan Brome, MD Cardiologist: No primary care provider on file.  Chief Complaint   Low heart rate  Patient Profile   72 year old male patient with a history of A. 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  Echocardiogram revealed new cardiomyopathy with EF 30 to 35% and he was transferred for further cardiac work-up.  HPI   This is a 72 y.o. male with a past medical history significant for A. 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  Echocardiogram revealed new cardiomyopathy with EF 30 to 35% and he was transferred for further cardiac work-up.  [name] has home health care and his nurse noted that his heart rate was very low.  Then he called his doctor who directed him to the emergency department at [facility].  There he had work-up including EKG which showed question A. fib and old left bundle branch block.  Cardiology was consulted.  Echocardiogram was performed which showed an EF of 30 to 35%, probable septal, inferior and mid to distal lateral wall hypokinesis, dilated atria and moderate pulmonary hypertension.  Labs indicated creatinine of 1 and normal H&H.  He also apparently was taken for nuclear stress testing, however that result is not available.  Currently he is asymptomatic, lying supine in no distress.  On telemetry heart rates in the 40s and what appears to be atrial flutter.  An EKG is being performed.  Records from [facility] in 2016 show that he had a cardiac catheterization.  At the time he was found to have an EF of 35%.  There was a report  of minor coronary artery disease with an aneurysmal LAD and a mild mid LAD stenosis.  PMHx   Past Medical History:  Diagnosis Date  . Atrial fibrillation ([place])   . Cellulitis   . CIDP (chronic inflammatory demyelinating polyneuropathy) ([place])   . Depression   . Diabetes ([place])   . Enlarged heart   . Gout     Past Surgical History:  Procedure Laterality Date  . APPENDECTOMY    . VOCAL CORD LATERALIZATION, ENDOSCOPIC APPROACH W/ MLB      FAMHx   Family History  Problem Relation Age of Onset  . Dementia Mother   . Heart disease Mother     SOCHx    reports that he quit smoking about 48 years ago. He has never used smokeless tobacco. He reports that he drinks alcohol. He reports that he does not use drugs.  Outpatient Medications   No current facility-administered medications on file prior to encounter.    Current Outpatient Medications on File Prior to Encounter  Medication Sig Dispense Refill  . allopurinol (ZYLOPRIM) 100 MG tablet Take 100 mg by mouth daily.    Marland Kitchen amoxicillin-clavulanate (AUGMENTIN) 875-125 MG tablet Take 1 tablet by mouth 2 (two) times daily. 20 tablet 0  . ARIPiprazole (ABILIFY) 2 MG tablet Take 2 mg by mouth daily.    Marland Kitchen aspirin 81 MG tablet Take 81 mg by mouth daily.    Marland Kitchen atorvastatin (LIPITOR) 10 MG tablet Take 10 mg by mouth daily.    . Bilberry 1000 MG CAPS Take by mouth.    Marland Kitchen  CINNAMON PO Take by mouth daily.    . ciprofloxacin (CIPRO) 500 MG tablet Take 1 tablet (500 mg total) by mouth 2 (two) times daily. 28 tablet 0  . clindamycin (CLEOCIN) 300 MG capsule Take 1 capsule (300 mg total) by mouth 3 (three) times daily. 30 capsule 0  . clotrimazole (LOTRIMIN) 1 % cream Apply 1 application topically 2 (two) times daily.    . collagenase (SANTYL) ointment Apply 1 application topically daily. 15 g 5  . cyanocobalamin (,VITAMIN B-12,) 1000 MCG/ML injection Inject 1 mL (1,000 mcg total) into the muscle once. Vitamin B12 1 ml IM daily x 7 days then 1 ml IM  weekly x 4 weeks then 1 ml IM monthly x 1 year. 1 mL 0  . desvenlafaxine (PRISTIQ) 100 MG 24 hr tablet Take 100 mg by mouth daily.    . fentaNYL (DURAGESIC - DOSED MCG/HR) 50 MCG/HR Place 50 mcg onto the skin every 3 (three) days.    . furosemide (LASIX) 40 MG tablet Take 40 mg by mouth.    Marland Kitchen HYDROcodone-acetaminophen (NORCO) 10-325 MG tablet Take 1 tablet by mouth every 4 (four) hours as needed. 20 tablet 0  . Insulin Glargine (TOUJEO SOLOSTAR) 300 UNIT/ML SOPN Inject into the skin.    Marland Kitchen L-Arginine 500 MG CAPS Take by mouth.    . Liraglutide (VICTOZA) 18 MG/3ML SOPN Inject into the skin.    . Lutein 40 MG CAPS Take by mouth.    . Lutein 6 MG TABS Take by mouth.    . LYSINE PO Take 1,000 mg by mouth daily.    . Meclizine HCl 25 MG CHEW Chew by mouth.    . metFORMIN (GLUCOPHAGE) 1000 MG tablet Take 1,000 mg by mouth 2 (two) times daily with a meal.    . oxycodone (OXY-IR) 5 MG capsule Take 5 mg by mouth every 4 (four) hours as needed.    . sulfamethoxazole-trimethoprim (BACTRIM) 400-80 MG tablet Take 1 tablet by mouth 2 (two) times daily. 28 tablet 0  . vortioxetine HBr (TRINTELLIX) 10 MG TABS Take 10 mg by mouth 1 day or 1 dose.      Inpatient Medications    Scheduled Meds:   Continuous Infusions:   PRN Meds:    ALLERGIES   Allergies  Allergen Reactions  . Other Cough    ROS   Pertinent items noted in HPI and remainder of comprehensive ROS otherwise negative.  Vitals   Vitals:   09/05/18 1959  BP: (!) 149/65  Pulse: (!) 40  Resp: 18  Temp: 98.1 F (36.7 C)  TempSrc: Oral  SpO2: 96%  Weight: (!) 173.7 kg  Height: 6\' 2"  (1.88 m)   No intake or output data in the 24 hours ending 09/05/18 2027 Filed Weights   09/05/18 1959  Weight: (!) 173.7 kg    Physical Exam   General appearance: alert, no distress and morbidly obese Neck: JVD - 3 cm above sternal notch, no carotid bruit and thyroid not enlarged, symmetric, no tenderness/mass/nodules Lungs: diminished  breath sounds bilaterally Heart: Regular bradycardia, no murmur Abdomen: Morbidly obese, soft, nontender, positive bowel sounds, no fluid wave Extremities: edema 1-2+ bilateral, venous stasis dermatitis noted and Status post right great toe amputation Pulses: 1+ pulses Skin: Pale, warm, dry Neurologic: Mental status: Alert, oriented, thought content appropriate Psych: Pleasant  Labs   No results found for this or any previous visit (from the past 48 hour(s)).  ECG   Atrial flutter with slow ventricular response  at 71- Personally Reviewed  Telemetry   Atrial flutter with slow ventricular response- Personally Reviewed  Radiology   No results found.  Cardiac Studies   N/A  Assessment   Principal Problem:   Acute on chronic combined systolic and diastolic CHF (congestive heart failure) (HCC) Active Problems:   CIDP (chronic inflammatory demyelinating polyneuropathy) (HCC)   LBBB (left bundle branch block)   Morbid obesity (HCC)   Paroxysmal atrial fibrillation (HCC)   Type 2 diabetes mellitus ([place])   Varicose veins of lower extremity   Plan   1. Johnathan Wright presented with bradycardia and appears to be in atrial flutter with slow ventricular response.  Apparently has a history of paroxysmal A. fib in the past as well.  He was thought to have a new onset cardiomyopathy however records from [facility] in 2016 indicated an LVEF of 35% at that time.  He underwent heart catheterization which showed no significant obstructive coronary disease.  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  He has left bundle branch block is chronic.  He will not likely need a repeat heart catheterization.  Would hold any AV nodal blocking medications.  He may need EP consultation in the a.m.  Keep n.p.o. after midnight.  Time Spent Directly with Patient:  I have spent a total of 45 minutes with patient reviewing hospital notes, telemetry, EKGs, labs and examining the patient as well as  establishing an assessment and plan that was discussed with the patient.  > 50% of time was spent in direct patient care.   Length of Stay:  LOS: 0 days   Pixie Casino, MD, [facility] [place] Director of the Advanced Lipid Disorders &  Cardiovascular Risk Reduction Clinic Diplomate of the American Board of Clinical Lipidology Attending Cardiologist  Direct Dial: [phone]  Fax: [phone]  Website:  [email or web address] Jeania Nater 09/05/2018, 8:27 PM

## 2018-09-05 NOTE — Progress Notes (Addendum)
ANTICOAGULATION CONSULT NOTE - Initial Consult  Pharmacy Consult for heparin Indication: atrial fibrillation  Allergies  Allergen Reactions  . Other Cough    Patient Measurements: Height: 6\' 2"  (188 cm) Weight: (!) 382 lb 14.4 oz (173.7 kg) IBW/kg (Calculated) : 82.2 Heparin Dosing Weight: 124 kg  Vital Signs: Temp: 98.1 F (36.7 C) (11/14 1959) Temp Source: Oral (11/14 1959) BP: 149/65 (11/14 1959) Pulse Rate: 40 (11/14 1959)  Labs: No results for input(s): HGB, HCT, PLT, APTT, LABPROT, INR, HEPARINUNFRC, HEPRLOWMOCWT, CREATININE, CKTOTAL, CKMB, TROPONINI in the last 72 hours.  CrCl cannot be calculated (No successful lab value found.).   Medical History: Past Medical History:  Diagnosis Date  . Atrial fibrillation ([place])   . Cellulitis   . CIDP (chronic inflammatory demyelinating polyneuropathy) ([place])   . Depression   . Diabetes ([place])   . Enlarged heart   . Gout    Assessment: 44 yom with hx of atrial fibrillation, presenting with bradycardia in AFib. No anticoag PTA.  Last received enoxaparin 85 mg (0.5 mg/kg for DVT ppx) on 11/13 at 2345. CBC WNL at OSH. No s/sx of bleeding.  Goal of Therapy:  Heparin level 0.3-0.7 units/ml Monitor platelets by anticoagulation protocol: Yes   Plan:  Give 3700 units bolus x 1 Start heparin infusion at 1700 units/hr Check anti-Xa level in 8 hours and daily while on heparin Continue to monitor H&H and platelets  Antonietta Jewel, PharmD Clinical Pharmacist  Pager: [phone] Phone: [phone] 09/05/2018,8:36 PM

## 2018-09-05 NOTE — Progress Notes (Signed)
Patient ID: Johnathan Wright, male   DOB: [birth year], 72 y.o.   MRN: [id number]   Patient presents at Dr. Eleanora Neighbor request to be casted for a custom molded balance braces with [facility] Certified Pedorthist.  Patient will return in 4 weeks to be fitted.

## 2018-09-06 ENCOUNTER — Encounter (HOSPITAL_COMMUNITY)
Admission: AD | Disposition: A | Discharge status: Home Health | Payer: Self-pay | Source: Other Acute Inpatient Hospital | Attending: Cardiology

## 2018-09-06 DIAGNOSIS — I480 Paroxysmal atrial fibrillation: Secondary | ICD-10-CM

## 2018-09-06 DIAGNOSIS — R001 Bradycardia, unspecified: Secondary | ICD-10-CM

## 2018-09-06 DIAGNOSIS — I428 Other cardiomyopathies: Secondary | ICD-10-CM

## 2018-09-06 HISTORY — PX: LEFT HEART CATH AND CORONARY ANGIOGRAPHY: CATH118249

## 2018-09-06 LAB — BASIC METABOLIC PANEL
Anion gap: 8 (ref 5–15)
BUN: 21 mg/dL (ref 8–23)
CO2: 28 mmol/L (ref 22–32)
Calcium: 8.7 mg/dL — ABNORMAL LOW (ref 8.9–10.3)
Chloride: 104 mmol/L (ref 98–111)
Creatinine, Ser: 1.18 mg/dL (ref 0.61–1.24)
GFR calc Af Amer: >60 mL/min (ref >60)
GFR calc non Af Amer: 59 mL/min — ABNORMAL LOW (ref >60)
Glucose, Bld: 127 mg/dL — ABNORMAL HIGH (ref 70–99)
Potassium: 4 mmol/L (ref 3.5–5.1)
Sodium: 140 mmol/L (ref 135–145)

## 2018-09-06 LAB — TROPONIN I
Troponin I: 0.03 ng/mL (ref <0.03)
Troponin I: 0.03 ng/mL (ref <0.03)

## 2018-09-06 LAB — CBC
HCT: 41.8 % (ref 39.0–52.0)
Hemoglobin: 13.6 g/dL (ref 13.0–17.0)
MCH: 29.3 pg (ref 26.0–34.0)
MCHC: 32.5 g/dL (ref 30.0–36.0)
MCV: 90.1 fL (ref 80.0–100.0)
Platelets: 149 10*3/uL — ABNORMAL LOW (ref 150–400)
RBC: 4.64 MIL/uL (ref 4.22–5.81)
RDW: 13.1 % (ref 11.5–15.5)
WBC: 6.7 10*3/uL (ref 4.0–10.5)
nRBC: 0 % (ref 0.0–0.2)

## 2018-09-06 LAB — PROTIME-INR
INR: 1.38
Prothrombin Time: 16.9 seconds — ABNORMAL HIGH (ref 11.4–15.2)

## 2018-09-06 LAB — GLUCOSE, CAPILLARY
Glucose-Capillary: 127 mg/dL — ABNORMAL HIGH (ref 70–99)
Glucose-Capillary: 113 mg/dL — ABNORMAL HIGH (ref 70–99)
Glucose-Capillary: 105 mg/dL — ABNORMAL HIGH (ref 70–99)
Glucose-Capillary: 143 mg/dL — ABNORMAL HIGH (ref 70–99)

## 2018-09-06 LAB — SURGICAL PCR SCREEN
MRSA, PCR: NEGATIVE
Staphylococcus aureus: NEGATIVE

## 2018-09-06 LAB — HEPARIN LEVEL (UNFRACTIONATED): Heparin Unfractionated: 0.28 IU/mL — ABNORMAL LOW (ref 0.30–0.70)

## 2018-09-06 SURGERY — LEFT HEART CATH AND CORONARY ANGIOGRAPHY
Anesthesia: LOCAL

## 2018-09-06 MED ORDER — IOHEXOL 350 MG/ML SOLN
INTRAVENOUS | Status: DC | PRN
  Administered 2018-09-06: 85 mL via INTRA_ARTERIAL

## 2018-09-06 MED ORDER — HEPARIN SODIUM (PORCINE) 1000 UNIT/ML IJ SOLN
INTRAMUSCULAR | Status: DC | PRN
  Administered 2018-09-06: 8000 [IU] via INTRAVENOUS

## 2018-09-06 MED ORDER — SODIUM CHLORIDE 0.9% FLUSH: 3.0000 mL | Freq: Two times a day (BID) | INTRAVENOUS | Status: DC

## 2018-09-06 MED ORDER — HEPARIN (PORCINE) IN NACL 1000-0.9 UT/500ML-% IV SOLN
INTRAVENOUS | Status: AC
  Filled 2018-09-06: qty 1000

## 2018-09-06 MED ORDER — LIDOCAINE HCL (PF) 1 % IJ SOLN
INTRAMUSCULAR | Status: DC | PRN
  Administered 2018-09-06: 2 mL

## 2018-09-06 MED ORDER — MIDAZOLAM HCL 2 MG/2ML IJ SOLN
INTRAMUSCULAR | Status: DC | PRN
  Administered 2018-09-06 (×2): 1 mg via INTRAVENOUS

## 2018-09-06 MED ORDER — SODIUM CHLORIDE 0.9 % IV SOLN
80.0000 mg | INTRAVENOUS | Status: DC
  Filled 2018-09-06: qty 2

## 2018-09-06 MED ORDER — MIDAZOLAM HCL 2 MG/2ML IJ SOLN
INTRAMUSCULAR | Status: AC
  Filled 2018-09-06: qty 2

## 2018-09-06 MED ORDER — ACETAMINOPHEN 325 MG PO TABS
ORAL_TABLET | ORAL | Status: AC
  Filled 2018-09-06: qty 2

## 2018-09-06 MED ORDER — VERAPAMIL HCL 2.5 MG/ML IV SOLN
INTRAVENOUS | Status: AC
  Filled 2018-09-06: qty 2

## 2018-09-06 MED ORDER — SODIUM CHLORIDE 0.9 % IV SOLN: 250.0000 mL | INTRAVENOUS | Status: DC

## 2018-09-06 MED ORDER — SODIUM CHLORIDE 0.9 % IV SOLN: 250.0000 mL | INTRAVENOUS | Status: DC | PRN

## 2018-09-06 MED ORDER — SODIUM CHLORIDE 0.9 % IV SOLN: INTRAVENOUS | Status: DC

## 2018-09-06 MED ORDER — SODIUM CHLORIDE 0.9% FLUSH: 3.0000 mL | INTRAVENOUS | Status: DC | PRN

## 2018-09-06 MED ORDER — MUPIROCIN 2 % EX OINT: 1.0000 "application " | TOPICAL_OINTMENT | Freq: Two times a day (BID) | CUTANEOUS | Status: DC

## 2018-09-06 MED ORDER — FENTANYL CITRATE (PF) 100 MCG/2ML IJ SOLN
INTRAMUSCULAR | Status: AC
  Filled 2018-09-06: qty 2

## 2018-09-06 MED ORDER — VERAPAMIL HCL 2.5 MG/ML IV SOLN
INTRAVENOUS | Status: DC | PRN
  Administered 2018-09-06: 10 mL via INTRA_ARTERIAL

## 2018-09-06 MED ORDER — ASPIRIN 81 MG PO CHEW: 81.0000 mg | CHEWABLE_TABLET | Freq: Every day | ORAL | Status: DC

## 2018-09-06 MED ORDER — CHLORHEXIDINE GLUCONATE 4 % EX LIQD
CUTANEOUS | Status: AC
  Administered 2018-09-06: 14:00:00
  Filled 2018-09-06: qty 15

## 2018-09-06 MED ORDER — CHLORHEXIDINE GLUCONATE 4 % EX LIQD
60.0000 mL | Freq: Once | CUTANEOUS | Status: DC
  Administered 2018-09-06: 4 via TOPICAL

## 2018-09-06 MED ORDER — LIDOCAINE HCL (PF) 1 % IJ SOLN
INTRAMUSCULAR | Status: AC
  Filled 2018-09-06: qty 30

## 2018-09-06 MED ORDER — ONDANSETRON HCL 4 MG/2ML IJ SOLN: 4.0000 mg | Freq: Four times a day (QID) | INTRAMUSCULAR | Status: DC | PRN

## 2018-09-06 MED ORDER — DEXTROSE 5 % IV SOLN
3.0000 g | INTRAVENOUS | Status: DC
  Filled 2018-09-06: qty 3000

## 2018-09-06 MED ORDER — SODIUM CHLORIDE 0.9% FLUSH
3.0000 mL | INTRAVENOUS | Status: DC | PRN
  Administered 2018-09-10: 3 mL via INTRAVENOUS
  Filled 2018-09-06: qty 3

## 2018-09-06 MED ORDER — SODIUM CHLORIDE 0.9% FLUSH
3.0000 mL | Freq: Two times a day (BID) | INTRAVENOUS | Status: DC
  Administered 2018-09-06 – 2018-09-10 (×6): 3 mL via INTRAVENOUS

## 2018-09-06 MED ORDER — HEPARIN (PORCINE) IN NACL 1000-0.9 UT/500ML-% IV SOLN
INTRAVENOUS | Status: DC | PRN
  Administered 2018-09-06 (×2): 500 mL

## 2018-09-06 MED ORDER — ACETAMINOPHEN 325 MG PO TABS: 650.0000 mg | ORAL_TABLET | ORAL | Status: DC | PRN

## 2018-09-06 MED ORDER — SODIUM CHLORIDE 0.9 % IV SOLN: INTRAVENOUS | Status: AC

## 2018-09-06 SURGICAL SUPPLY — 14 items
CATH INFINITI 5FR JL4 (CATHETERS) ×2 IMPLANT
CATH INFINITI JR4 5F (CATHETERS) ×2 IMPLANT
CATH LAUNCHER 5F EBU3.5 (CATHETERS) ×2 IMPLANT
CATH OPTITORQUE TIG 4.0 5F (CATHETERS) ×2 IMPLANT
DEVICE RAD COMP TR BAND LRG (VASCULAR PRODUCTS) ×2 IMPLANT
ELECT DEFIB PAD ADLT CADENCE (PAD) ×2 IMPLANT
GLIDESHEATH SLEND SS 6F .021 (SHEATH) ×2 IMPLANT
GUIDEWIRE INQWIRE 1.5J.035X260 (WIRE) ×1 IMPLANT
HOVERMATT SINGLE USE (MISCELLANEOUS) ×2 IMPLANT
INQWIRE 1.5J .035X260CM (WIRE) ×2
KIT HEART LEFT (KITS) ×2 IMPLANT
PACK CARDIAC CATHETERIZATION (CUSTOM PROCEDURE TRAY) ×2 IMPLANT
TRANSDUCER W/STOPCOCK (MISCELLANEOUS) ×2 IMPLANT
TUBING CIL FLEX 10 FLL-RA (TUBING) ×2 IMPLANT

## 2018-09-06 NOTE — Progress Notes (Signed)
Patient ID: Johnathan Wright, male   DOB: 30-May-1946, 72 y.o.   MRN: [id number]   Patient presents at Dr Price's request to be measured for diabetic shoes and inserts with [facility] Certified Pedorthist.  Patient will be called when shoes and inserts arrive to schedule a fitting.

## 2018-09-06 NOTE — Progress Notes (Signed)
ANTICOAGULATION CONSULT NOTE - Follow Up Consult  Pharmacy Consult for heparin Indication: atrial fibrillation  Labs: Recent Labs    09/05/18 2242 09/06/18 0424 09/06/18 0545  HGB 14.2 13.6  --   HCT 44.0 41.8  --   PLT 157 149*  --   HEPARINUNFRC  --   --  0.28*  CREATININE 1.26* 1.18  --   TROPONINI 0.04* 0.03*  --     Assessment: 72yo male slightly subtherapeutic on heparin with initial dosing for Afib.  Goal of Therapy:  Heparin level 0.3-0.7 units/ml   Plan:  Will increase heparin gtt by ~1 units/kg/hr to 1900 units/hr and check level in 6 hours.    Wynona Neat, PharmD, BCPS  09/06/2018,6:32 AM

## 2018-09-06 NOTE — Progress Notes (Signed)
Responded to PIV consult. Staff present to take pt for procedure; RN states PIV access will be obtained by Cath lab

## 2018-09-06 NOTE — Interval H&P Note (Signed)
Cath Lab Visit (complete for each Cath Lab visit)  Clinical Evaluation Leading to the Procedure:   ACS: No.  Non-ACS:    Anginal Classification: CCS II  Anti-ischemic medical therapy: No Therapy  Non-Invasive Test Results: No non-invasive testing performed  Prior CABG: No previous CABG      History and Physical Interval Note:  09/06/2018 1:54 PM  Johnathan Wright  has presented today for surgery, with the diagnosis of Chest Pain  The various methods of treatment have been discussed with the patient and family. After consideration of risks, benefits and other options for treatment, the patient has consented to  Procedure(s): LEFT HEART CATH AND CORONARY ANGIOGRAPHY (N/A) as a surgical intervention .  The patient's history has been reviewed, patient examined, no change in status, stable for surgery.  I have reviewed the patient's chart and labs.  Questions were answered to the patient's satisfaction.     Shelva Majestic

## 2018-09-06 NOTE — Progress Notes (Addendum)
Progress Note  Patient Name: Johnathan Wright Date of Encounter: 09/06/2018  Primary Cardiologist: No primary care provider on file.   Subjective   Patient has no complaints at this time. He denies any chest pain or shortness of breath.  Inpatient Medications    Scheduled Meds: . allopurinol  100 mg Oral Daily  . ARIPiprazole  2 mg Oral Daily  . aspirin EC  81 mg Oral Daily  . atorvastatin  10 mg Oral Daily  . insulin aspart  0-15 Units Subcutaneous TID WC  . vortioxetine HBr  20 mg Oral Daily   Continuous Infusions: . heparin 1,900 Units/hr (09/06/18 1610)   PRN Meds: acetaminophen, HYDROcodone-acetaminophen, nitroGLYCERIN, ondansetron (ZOFRAN) IV   Vital Signs    Vitals:   09/05/18 1959 09/06/18 0034 09/06/18 0401 09/06/18 0832  BP: (!) 149/65 129/73 140/72 137/63  Pulse: (!) 40 (!) 40 (!) 40 (!) 39  Resp: 18 18 20 18   Temp: 98.1 F (36.7 C) 98 F (36.7 C) 97.8 F (36.6 C) 98.2 F (36.8 C)  TempSrc: Oral   Oral  SpO2: 96% 93% 96% 94%  Weight: (!) 173.7 kg     Height: 6\' 2"  (1.88 m)       Intake/Output Summary (Last 24 hours) at 09/06/2018 1117 Last data filed at 09/06/2018 0657 Gross per 24 hour  Intake 172.47 ml  Output -  Net 172.47 ml   Filed Weights   09/05/18 1959  Weight: (!) 173.7 kg    Telemetry    Atrial flutter with slow ventricular response, heart rates in the 30s to 40s. - Personally Reviewed  Physical Exam   GEN: Morbidly obese Caucasian male resting comfortably. Alert and in no acute distress.   Neck: Supple. Cardiac: Bradycardic with regular rhythm. No murmurs, rubs, or gallops.  Respiratory: Clear to auscultation bilaterally. No wheezes, rhonchi, or rales. GI: Abdomen soft, obese, and non-tender. MS: Venous stasis dermatitis of right lower extremity noted. Left lower extremity wrapped at time of exam. S/p right great toe amputation. Skin: Warm and dry. Neuro:  No focal deficits. Psych: Normal affect.  Labs     Chemistry Recent Labs  Lab 09/05/18 2242 09/06/18 0424  NA  --  140  K  --  4.0  CL  --  104  CO2  --  28  GLUCOSE  --  127*  BUN  --  21  CREATININE 1.26* 1.18  CALCIUM  --  8.7*  GFRNONAA 54* 59*  GFRAA >60 >60  ANIONGAP  --  8     Hematology Recent Labs  Lab 09/05/18 2242 09/06/18 0424  WBC 7.6 6.7  RBC 4.86 4.64  HGB 14.2 13.6  HCT 44.0 41.8  MCV 90.5 90.1  MCH 29.2 29.3  MCHC 32.3 32.5  RDW 12.9 13.1  PLT 157 149*    Cardiac Enzymes Recent Labs  Lab 09/05/18 2242 09/06/18 0424  TROPONINI 0.04* 0.03*   No results for input(s): TROPIPOC in the last 168 hours.   BNP Recent Labs  Lab 09/05/18 2242  BNP 87.7     DDimer No results for input(s): DDIMER in the last 168 hours.   Radiology    No results found.  Cardiac Studies   Cardiac Catheterization 07/29/2015 [facility]): Angiographic Findings: - LMCA: Normal. - LAD: Aneurysmal. - LCx: Normal. - RCA: Focal stenosis.  Lesion on Mid RCA: Mid subsection.25% stenosis 8 mm length .  Diagnostic Procedure Summary: - There is minor CAD. - Moderate global LV  dysfunction - EF 35%  Diagnostic Procedure Recommendations: - Medical therapy for LV dysfunction.  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 Echocardiogram revealed new cardiomyopathy with EF 30-35% and he was transferred to [facility] for further cardiac work-up.  Assessment & Plan    1. Atrial Flutter with Slow Ventricular Response - Telemetry shows atrial flutter with heart rates in the 30s to 40s. 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 Medical therapy for LV dysfunction was recommended at that time. However, it does not appear that  the patient has been taking any ACE/ARB or beta-blocker.  - Echo done at [facility] yesterday showed LVEF of 30-35%. Endocardium was difficult to visualize but hypokinesis of the septum, inferior wall, and mid to distal lateral wall is suspected. - I called and spoke with Dr. Nevada Crane, one of the Internal Medicine physicians at [facility], who read the results of the stress test to me. Lexiscan Myoview yesterday showed a large perfusion defect involving the apex and extending into the inferior wall.  - Patient asymptomatic at this time. - Patient is scheduled for a cardiac catheterization. Patient is NPO and orders have been placed. - Patient will also likely need chronic anticoagulation after catheterization. - EP has been consulted.   2. Cardiomyopathy with EF of 30-35% - Echo showed LVEF of 30-35%. - Will need medical therapy for LV dysfunction following cardiac catheterization. Patient may also require AICD. - EP has been consulted.    For questions or updates, please contact [place] Please consult [email or web address] for contact info under      Signed, Darreld Mclean, PA-C  09/06/2018, 11:17 AM    Personally seen and examined. Agree with above.  72 year old morbidly obese male with healing left thigh wound, prior right toe amputation, here with new onset bradycardia in the setting of atrial flutter.  Currently without chest pain without shortness of breath, no syncope.  GEN: Well nourished, well developed, in no acute distress Obese HEENT: normal  Neck: no JVD, carotid bruits, or masses Cardiac: Bradycardic slightly irregular; no murmurs, rubs, or gallops,no edema  Respiratory:  clear to auscultation bilaterally, normal work of breathing GI: soft, nontender, nondistended, + BS MS: no deformity or atrophy  Skin: warm and dry, no rash, left leg wound dressed, right toe amputation Neuro:  Alert and Oriented x 3, Strength and sensation are intact Psych: euthymic mood, full  affect  Lab work, EKG, telemetry personally reviewed.  Typical atrial flutter with slow ventricular response.  Prior catheterization 2016 25% lesion RCA.  EF 35% at that time.  Assessment and plan:   Atrial flutter with slow ventricular response -Given his recent bradycardia and unclear timeline of atrial flutter, make sense for [place] to proceed with cardiac catheterization to ensure that there is no evidence of ischemia/advancement of right coronary artery disease for instance.  Risks and benefits of cardiac catheterization including stroke heart attack death renal impairment bleeding have been discussed and he is willing to proceed.  Morbid obesity -Continue to encourage weight loss  Typical atrial flutter - Unknown chronicity.  His bradycardia has been detected by the home health wound nurse earlier this week.  Previous to this encounter, no evidence of bradycardia. - Depending on  results of cardiac catheterization, will likely warrant pacemaker implantation.  Of course there is mildly increased risk of infection given his healing left leg wound. -EP is currently seeing.  Left bundle branch block - May benefit from left ventricular lead  Mildly elevated troponin -May be secondary to his underlying cardiomyopathy.  He is not complaining of any chest discomfort typical of ACS.  Dilated cardiomyopathy -This is been present since 2015.  Cardiac catheterization at that time at [facility] showed no evidence of significant coronary artery disease.  In review of prior cardiology notes however, I do not see any beta-blocker or ACE inhibitor medications utilized.  This may be because of prior issues with blood pressure possibly, unsure. Candee Furbish, MD

## 2018-09-06 NOTE — Progress Notes (Signed)
Patient had a  Transient slurred speech, after hi woke up.Dr Claiborne Billings was notfifed, and spoke with the patient. His speech cleared up after several minutes. Pt. Stated that he felt that he can get his words out.   He seems to be fine now.   Condom catheter was also placed to help him with his voiding.   He verbally agreed to this.

## 2018-09-06 NOTE — Consult Note (Addendum)
Cardiology Consultation:   Patient ID: Johnathan Wright MRN: [id number]; DOB: 1946/01/11  Admit date: 09/05/2018 Date of Consult: 09/06/2018  Primary Care Provider: Rochel Brome, MD Primary Cardiologist: new Dr. Debara Pickett Primary Electrophysiologist:  None    Patient Profile:   Johnathan Wright is a 72 y.o. male with a hx of DM, cellulitis,  w/non-healing wound >> R great toe amputation, gout, morbid obesity, CIDP (chronic inflammatory demyelinating polyneuropathy), LBBB, who is being seen today for the evaluation of bradycardia at the request of Dr. Marlou Porch.  History of Present Illness:   Johnathan Wright was referred to [facility] by his PMD for bradycardia.  His PMD visit prompted apparently by home health RN who found his pulse slow, without clear symptoms.  At the ER in review of record HR ranged 35-42 and LBBB they report as old, no noted home meds with rate/nodal blocking properties.  Echo done at [facility] 09/05/18 Very limited LV appears normal in size,  Mod reduced systolic function, , endocardium difficult to visualize however hypokinesis of septum, IW, and mid-distal lat wall suspected, EF 30-34% RV  Mildly dilated, low systolic function, RVSP 80-99, both atria are dilated, no significant VHD is described  09/05/18 lexiscan stress test completed Images pending at the time of the dictation  LABS K+ 4.2 BUN/Creat 26/1.00 WBC 7.3 H/H 14/43 Plts 149  The patient has not noted any change to how he feels, no CP, palpitations or SOB.  No unusual weakness, no dizzy spells, no near syncope or syncope.   Past Medical History:  Diagnosis Date  . Atrial fibrillation ([place])   . Cellulitis   . CIDP (chronic inflammatory demyelinating polyneuropathy) ([place])   . Depression   . Diabetes ([place])   . Enlarged heart   . Gout     Past Surgical History:  Procedure Laterality Date  . APPENDECTOMY    . VOCAL CORD LATERALIZATION, ENDOSCOPIC APPROACH W/ MLB       Home Medications:    Prior to Admission medications   Medication Sig Start Date End Date Taking? Authorizing Provider  allopurinol (ZYLOPRIM) 100 MG tablet Take 100 mg by mouth daily.   Yes [provider]  ARIPiprazole (ABILIFY) 2 MG tablet Take 2 mg by mouth daily.   Yes [provider]  atorvastatin (LIPITOR) 10 MG tablet Take 10 mg by mouth daily.   Yes [provider]  Bilberry 1000 MG CAPS Take by mouth.   Yes [provider]  furosemide (LASIX) 40 MG tablet Take 20 mg by mouth.    Yes [provider]  Insulin Glargine (TOUJEO SOLOSTAR) 300 UNIT/ML SOPN Inject into the skin.   Yes [provider]  Lutein 40 MG CAPS Take by mouth.   Yes [provider]  Lutein 6 MG TABS Take by mouth.   Yes [provider]  LYSINE PO Take 1,000 mg by mouth daily.   Yes [provider]  vortioxetine HBr (TRINTELLIX) 10 MG TABS Take 20 mg by mouth daily.    Yes [provider]  clotrimazole (LOTRIMIN) 1 % cream Apply 1 application topically 2 (two) times daily.    [provider]  desvenlafaxine (PRISTIQ) 100 MG 24 hr tablet Take 100 mg by mouth daily.    [provider]  fentaNYL (DURAGESIC - DOSED MCG/HR) 50 MCG/HR Place 50 mcg onto the skin every 3 (three) days.    [provider]  L-Arginine 500 MG CAPS Take by mouth.    [provider]  Liraglutide (VICTOZA) 18 MG/3ML SOPN Inject into the skin.    [provider]  Meclizine HCl 25 MG CHEW Chew by mouth.    [provider]  metFORMIN (GLUCOPHAGE) 1000 MG tablet Take 1,000 mg by mouth 2 (two) times daily with a meal.    [provider]  oxycodone (OXY-IR) 5 MG capsule Take 5 mg by mouth every 4 (four) hours as needed.    [provider]    Inpatient Medications: Scheduled Meds: . allopurinol  100 mg Oral Daily  . ARIPiprazole  2 mg Oral Daily  . aspirin EC  81 mg Oral Daily  . atorvastatin  10 mg Oral Daily   . insulin aspart  0-15 Units Subcutaneous TID WC  . vortioxetine HBr  20 mg Oral Daily   Continuous Infusions: . heparin 1,900 Units/hr (09/06/18 6301)   PRN Meds: acetaminophen, HYDROcodone-acetaminophen, nitroGLYCERIN, ondansetron (ZOFRAN) IV  Allergies:    Allergies  Allergen Reactions  . Other Cough    Social History:   Social History   Socioeconomic History  . Marital status: Divorced    Spouse name: Not on file  . Number of children: Not on file  . Years of education: Not on file  . Highest education level: Not on file  Occupational History  . Not on file  Social Needs  . Financial resource strain: Not on file  . Food insecurity:    Worry: Not on file    Inability: Not on file  . Transportation needs:    Medical: Not on file    Non-medical: Not on file  Tobacco Use  . Smoking status: Former Smoker    Last attempt to quit: 10/23/1969    Years since quitting: 48.9  . Smokeless tobacco: Never Used  Substance and Sexual Activity  . Alcohol use: Yes    Alcohol/week: 0.0 Wright drinks    Comment: 6 pack a week  . Drug use: No  . Sexual activity: Not on file  Lifestyle  . Physical activity:    Days per week: Not on file    Minutes per session: Not on file  . Stress: Not on file  Relationships  . Social connections:    Talks on phone: Not on file    Gets together: Not on file    Attends religious service: Not on file    Active member of club or organization: Not on file    Attends meetings of clubs or organizations: Not on file    Relationship status: Not on file  . Intimate partner violence:    Fear of current or ex partner: Not on file    Emotionally abused: Not on file    Physically abused: Not on file    Forced sexual activity: Not on file  Other Topics Concern  . Not on file  Social History Narrative  . Not on file    Family History:   Family History  Problem Relation Age of Onset  . Dementia Mother   . Heart disease Mother      ROS:   Please see the history of present illness.  All other ROS reviewed and negative.     Physical Exam/Data:   Vitals:   09/05/18 1959 09/06/18 0034 09/06/18 0401 09/06/18 0832  BP: (!) 149/65 129/73 140/72 137/63  Pulse: (!) 40 (!) 40 (!) 40 (!) 39  Resp: 18 18 20 18   Temp: 98.1 F (36.7 C) 98 F (36.7 C) 97.8 F (36.6 C)  98.2 F (36.8 C)  TempSrc: Oral   Oral  SpO2: 96% 93% 96% 94%  Weight: (!) 173.7 kg     Height: 6\' 2"  (1.88 m)       Intake/Output Summary (Last 24 hours) at 09/06/2018 1105 Last data filed at 09/06/2018 0657 Gross per 24 hour  Intake 172.47 ml  Output -  Net 172.47 ml   Filed Weights   09/05/18 1959  Weight: (!) 173.7 kg   Body mass index is 49.16 kg/m.  General:  Well nourished, well developed, morbidly obese, in no acute distress HEENT: normal Lymph: no adenopathy Neck: JVD is difficult to assess Endocrine:  No thryomegaly Vascular: No carotid bruits Cardiac:  irreg-irreg, bradycardic; no murmurs, gallops or wheezes Lungs:  CTA b/l , no wheezing, rhonchi or rales  Abd: soft, nontender, obese Ext: LLE ACE wrap in place, +1 edema, chronic looking skin changes, R great toe amputated Musculoskeletal:  No deformities Skin: warm and dry  Neuro:  no focal abnormalities noted Psych:  Normal affect, very pleasent  EKG:  The EKG was personally reviewed and demonstrates:   AFlutter, 40bpm, LBBB, QRS 146ms  Telemetry:  Telemetry was personally reviewed and demonstrates:   AFlutter 30's-40's  Relevant CV Studies:  07/28/15: LHC Conclusions Diagnostic Procedure Summary There is minor CAD. Moderate global LV dysfunction --EF 35%  Laboratory Data:  Chemistry Recent Labs  Lab 09/05/18 2242 09/06/18 0424  NA  --  140  K  --  4.0  CL  --  104  CO2  --  28  GLUCOSE  --  127*  BUN  --  21  CREATININE 1.26* 1.18  CALCIUM  --  8.7*  GFRNONAA 54* 59*  GFRAA >60 >60  ANIONGAP  --  8    No results for input(s): PROT, ALBUMIN, AST, ALT,  ALKPHOS, BILITOT in the last 168 hours. Hematology Recent Labs  Lab 09/05/18 2242 09/06/18 0424  WBC 7.6 6.7  RBC 4.86 4.64  HGB 14.2 13.6  HCT 44.0 41.8  MCV 90.5 90.1  MCH 29.2 29.3  MCHC 32.3 32.5  RDW 12.9 13.1  PLT 157 149*   Cardiac Enzymes Recent Labs  Lab 09/05/18 2242 09/06/18 0424  TROPONINI 0.04* 0.03*   No results for input(s): TROPIPOC in the last 168 hours.  BNP Recent Labs  Lab 09/05/18 2242  BNP 87.7    DDimer No results for input(s): DDIMER in the last 168 hours.  Radiology/Studies:  No results found.  Assessment and Plan:   1. Bradycardia     Unclear onset timing, he has [facility] RN that saw him Friday of last week without reports of slow HR, and his podiatrist this Monday again no mention to him of slow HR, until Wed when [facility] RN noted     No appreciable symptoms     Despite lack of symptoms, outside of severe RCA disease, likely he will need a device  If no significant CAD, will plan for CRT-D implant  Dr. Curt Bears has seen and examined the patient, discuss possibility of PPM possibly ICD, discussed implant procedure, its possible risks and benefits.  The patient is agreeable to proceed if needed   2. NICM     The patient's CM looks to be NICM with LHC in 2016 with no obstructive disease and EF then of 25%, I don;t see that he has been on GDMT, certainly can not at this juncture be on BB. He had WMA on his echo(beyond septal dyssynchrony) and  a stress test in [place] with unknown result. (cardiology team is trying to track down), consult note mentions looking at prelim stress images only were abnormal.     He is very sedentary, spends most of his days in his recliner, denies any SOB     Fluid status is difficult to assess with body habitus, BNP was only 87  3. AFlutter     Looks like he carries hx of AFib     CHA2DS2Vasc is at least 4, currently on a heparin gtt, though not anticoagulated out patient     He will need [place]  4. Stress test from  [facility] came bak abnormal     Large perfusion defect  At apex extending to IW     Planned for cath      For questions or updates, please contact [place] Please consult [email or web address] for contact info under     Signed, Baldwin Jamaica, PA-C  09/06/2018 11:05 AM  I have seen and examined this patient with Johnathan Wright.  Agree with above, note added to reflect my findings.  On exam, bradycardic, no murmurs, lungs clear.  Patient admitted to the hospital with bradycardia.  He has a pre-existing left bundle branch block and apparent atrial flutter.  He was found to have systolic heart failure.  Left heart catheterization shows no evidence of coronary artery disease.  He will likely need pacemaker implant in the future.  We will have him return at a later date for likely CRT D implantation.  Will M. Camnitz MD 09/06/2018 5:16 PM

## 2018-09-07 LAB — CBC
HCT: 41.6 % (ref 39.0–52.0)
Hemoglobin: 13.2 g/dL (ref 13.0–17.0)
MCH: 28.7 pg (ref 26.0–34.0)
MCHC: 31.7 g/dL (ref 30.0–36.0)
MCV: 90.4 fL (ref 80.0–100.0)
Platelets: 168 10*3/uL (ref 150–400)
RBC: 4.6 MIL/uL (ref 4.22–5.81)
RDW: 13 % (ref 11.5–15.5)
WBC: 6.8 10*3/uL (ref 4.0–10.5)
nRBC: 0 % (ref 0.0–0.2)

## 2018-09-07 LAB — GLUCOSE, CAPILLARY
Glucose-Capillary: 128 mg/dL — ABNORMAL HIGH (ref 70–99)
Glucose-Capillary: 111 mg/dL — ABNORMAL HIGH (ref 70–99)
Glucose-Capillary: 135 mg/dL — ABNORMAL HIGH (ref 70–99)
Glucose-Capillary: 130 mg/dL — ABNORMAL HIGH (ref 70–99)

## 2018-09-07 LAB — BASIC METABOLIC PANEL
Anion gap: 5 (ref 5–15)
BUN: 18 mg/dL (ref 8–23)
CO2: 29 mmol/L (ref 22–32)
Calcium: 8.9 mg/dL (ref 8.9–10.3)
Chloride: 104 mmol/L (ref 98–111)
Creatinine, Ser: 1.17 mg/dL (ref 0.61–1.24)
GFR calc Af Amer: >60 mL/min (ref >60)
GFR calc non Af Amer: 60 mL/min — ABNORMAL LOW (ref >60)
Glucose, Bld: 128 mg/dL — ABNORMAL HIGH (ref 70–99)
Potassium: 4.3 mmol/L (ref 3.5–5.1)
Sodium: 138 mmol/L (ref 135–145)

## 2018-09-07 MED ORDER — FUROSEMIDE 10 MG/ML IJ SOLN
40.0000 mg | Freq: Two times a day (BID) | INTRAMUSCULAR | Status: DC
  Administered 2018-09-07 – 2018-09-10 (×7): 40 mg via INTRAVENOUS
  Filled 2018-09-07 (×7): qty 4

## 2018-09-07 NOTE — Progress Notes (Signed)
Progress Note  Patient Name: Johnathan Wright Date of Encounter: 09/07/2018  Primary Cardiologist: Hilty  Subjective   Still with dyspnea Prior to admission very limited activity and ambulation due to CIDP  Inpatient Medications    Scheduled Meds: . allopurinol  100 mg Oral Daily  . ARIPiprazole  2 mg Oral Daily  . aspirin EC  81 mg Oral Daily  . atorvastatin  10 mg Oral Daily  . insulin aspart  0-15 Units Subcutaneous TID WC  . mupirocin ointment  1 application Nasal BID  . sodium chloride flush  3 mL Intravenous Q12H  . vortioxetine HBr  20 mg Oral Daily   Continuous Infusions: . sodium chloride     PRN Meds: sodium chloride, acetaminophen, acetaminophen, HYDROcodone-acetaminophen, nitroGLYCERIN, ondansetron (ZOFRAN) IV, sodium chloride flush   Vital Signs    Vitals:   09/06/18 2101 09/06/18 2131 09/07/18 0003 09/07/18 0428  BP: (!) 142/76 96/73 140/70 137/68  Pulse:   (!) 41 (!) 40  Resp:   20 18  Temp:   97.9 F (36.6 C) 98.4 F (36.9 C)  TempSrc:   Oral Oral  SpO2:   91% 94%  Weight:    (!) 177.3 kg  Height:        Intake/Output Summary (Last 24 hours) at 09/07/2018 0906 Last data filed at 09/07/2018 0444 Gross per 24 hour  Intake 243 ml  Output 1100 ml  Net -857 ml   Filed Weights   09/05/18 1959 09/07/18 0428  Weight: (!) 173.7 kg (!) 177.3 kg    Telemetry    Atrial flutter with slow ventricular response, heart rates in the 30s to 40s. - Personally Reviewed  Physical Exam   Affect appropriate Morbidly obese white male  HEENT: normal Neck supple with no adenopathy JVP normal no bruits no thyromegaly Lungs clear with no wheezing and good diaphragmatic motion Heart:  S1/S2 no murmur, no rub, gallop or click PMI normal Abdomen: benighn, BS positve, no tenderness, no AAA no bruit.  No HSM or HJR Distal pulses intact with no bruits Plus 2 bilateral edema with wraps on scrotal edema  Neuro non-focal Skin warm and dry No muscular  weakness   Labs    Chemistry Recent Labs  Lab 09/05/18 2242 09/06/18 0424 09/07/18 0430  NA  --  140 138  K  --  4.0 4.3  CL  --  104 104  CO2  --  28 29  GLUCOSE  --  127* 128*  BUN  --  21 18  CREATININE 1.26* 1.18 1.17  CALCIUM  --  8.7* 8.9  GFRNONAA 54* 59* 60*  GFRAA >60 >60 >60  ANIONGAP  --  8 5     Hematology Recent Labs  Lab 09/05/18 2242 09/06/18 0424 09/07/18 0430  WBC 7.6 6.7 6.8  RBC 4.86 4.64 4.60  HGB 14.2 13.6 13.2  HCT 44.0 41.8 41.6  MCV 90.5 90.1 90.4  MCH 29.2 29.3 28.7  MCHC 32.3 32.5 31.7  RDW 12.9 13.1 13.0  PLT 157 149* 168    Cardiac Enzymes Recent Labs  Lab 09/05/18 2242 09/06/18 0424 09/06/18 1034  TROPONINI 0.04* 0.03* 0.03*   No results for input(s): TROPIPOC in the last 168 hours.   BNP Recent Labs  Lab 09/05/18 2242  BNP 87.7     DDimer No results for input(s): DDIMER in the last 168 hours.   Radiology    No results found.  Cardiac Studies   Cardiac Catheterization 07/29/2015 (  [facility]): Angiographic Findings: - LMCA: Normal. - LAD: Aneurysmal. - LCx: Normal. - RCA: Focal stenosis.  Lesion on Mid RCA: Mid subsection.25% stenosis 8 mm length .  Diagnostic Procedure Summary: - There is minor CAD. - Moderate global LV dysfunction - EF 35%  Diagnostic Procedure Recommendations: - Medical therapy for LV dysfunction.  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 Echocardiogram revealed new cardiomyopathy with EF 30-35% and he was transferred to [facility] for further cardiac work-up. Cath with no CAD Being considered for CRT-AICD   Assessment & Plan    1. Atrial Flutter with Slow Ventricular Response- Dr Caryl Comes to see over weekend appears that he will   Need CRT device before d/c given bradycardia but this is not clear from Dr Curt Bears notes  Keep on heparin Until this is clarified   2. Cardiomyopathy with EF of 30-35% - Echo showed LVEF of 30-35%. Cath with no CAD no right heart cath done LVEDP 24 mmHg and clinically still grossly volume overloaded  Was on 20 mg lasix daily at home Will write for lasix 40 mg iv bid for now    For questions or updates, please contact [facility] Please consult [email or web address] for contact info under      Signed, Jenkins Rouge, MD  09/07/2018, 9:06 AM

## 2018-09-08 DIAGNOSIS — I4892 Unspecified atrial flutter: Secondary | ICD-10-CM

## 2018-09-08 LAB — GLUCOSE, CAPILLARY
Glucose-Capillary: 132 mg/dL — ABNORMAL HIGH (ref 70–99)
Glucose-Capillary: 141 mg/dL — ABNORMAL HIGH (ref 70–99)
Glucose-Capillary: 115 mg/dL — ABNORMAL HIGH (ref 70–99)
Glucose-Capillary: 126 mg/dL — ABNORMAL HIGH (ref 70–99)

## 2018-09-08 LAB — CBC
HCT: 44.3 % (ref 39.0–52.0)
Hemoglobin: 13.8 g/dL (ref 13.0–17.0)
MCH: 28.3 pg (ref 26.0–34.0)
MCHC: 31.2 g/dL (ref 30.0–36.0)
MCV: 90.8 fL (ref 80.0–100.0)
Platelets: 152 10*3/uL (ref 150–400)
RBC: 4.88 MIL/uL (ref 4.22–5.81)
RDW: 12.8 % (ref 11.5–15.5)
WBC: 6.7 10*3/uL (ref 4.0–10.5)
nRBC: 0 % (ref 0.0–0.2)

## 2018-09-08 MED ORDER — SODIUM CHLORIDE 0.9 % IV SOLN
INTRAVENOUS | Status: DC
  Administered 2018-09-08: via INTRAVENOUS

## 2018-09-08 MED ORDER — SODIUM CHLORIDE 0.9 % IV SOLN
INTRAVENOUS | Status: DC
  Administered 2018-09-09: 06:00:00 via INTRAVENOUS

## 2018-09-08 MED ORDER — DEXTROSE 5 % IV SOLN
3.0000 g | INTRAVENOUS | Status: DC
  Administered 2018-09-09: 3 g via INTRAVENOUS
  Filled 2018-09-08: qty 3

## 2018-09-08 MED ORDER — SODIUM CHLORIDE 0.9 % IV SOLN
80.0000 mg | INTRAVENOUS | Status: DC
  Filled 2018-09-08: qty 2

## 2018-09-08 NOTE — Progress Notes (Signed)
Progress Note  Patient Name: Johnathan Wright Date of Encounter: 09/08/2018  Primary Cardiologist: Hilty  Subjective   Dyspnea persists slow diuresis activity limited by neuropathic disease LE;s   Inpatient Medications    Scheduled Meds: . allopurinol  100 mg Oral Daily  . ARIPiprazole  2 mg Oral Daily  . aspirin EC  81 mg Oral Daily  . atorvastatin  10 mg Oral Daily  . furosemide  40 mg Intravenous BID  . insulin aspart  0-15 Units Subcutaneous TID WC  . sodium chloride flush  3 mL Intravenous Q12H  . vortioxetine HBr  20 mg Oral Daily   Continuous Infusions: . sodium chloride     PRN Meds: sodium chloride, acetaminophen, acetaminophen, HYDROcodone-acetaminophen, nitroGLYCERIN, ondansetron (ZOFRAN) IV, sodium chloride flush   Vital Signs    Vitals:   09/07/18 0428 09/07/18 1349 09/07/18 2055 09/08/18 0601  BP: 137/68 117/61 127/62 (!) 117/41  Pulse: (!) 40 (!) 40 (!) 41 (!) 40  Resp: 18  17 14   Temp: 98.4 F (36.9 C) 98.6 F (37 C) 98.1 F (36.7 C) 98 F (36.7 C)  TempSrc: Oral Oral Oral Oral  SpO2: 94% 99% 98% 98%  Weight: (!) 177.3 kg   (!) 170.5 kg  Height:        Intake/Output Summary (Last 24 hours) at 09/08/2018 0849 Last data filed at 09/07/2018 2121 Gross per 24 hour  Intake 840 ml  Output 1952 ml  Net -1112 ml   Filed Weights   09/05/18 1959 09/07/18 0428 09/08/18 0601  Weight: (!) 173.7 kg (!) 177.3 kg (!) 170.5 kg    Telemetry    Atrial flutter with slow ventricular response, heart rates in the 30s to 40s. - Personally Reviewed  Physical Exam   Affect appropriate Morbidly obese white male  HEENT: normal Neck supple with no adenopathy JVP normal no bruits no thyromegaly Lungs clear with no wheezing and good diaphragmatic motion Heart:  S1/S2 no murmur, no rub, gallop or click PMI normal Abdomen: benighn, BS positve, no tenderness, no AAA no bruit.  No HSM or HJR Distal pulses intact with no bruits Plus 2 bilateral edema with  wraps on scrotal edema  Neuro non-focal Skin warm and dry Amputation toes right foot  Weak LE;s    Labs    Chemistry Recent Labs  Lab 09/05/18 2242 09/06/18 0424 09/07/18 0430  NA  --  140 138  K  --  4.0 4.3  CL  --  104 104  CO2  --  28 29  GLUCOSE  --  127* 128*  BUN  --  21 18  CREATININE 1.26* 1.18 1.17  CALCIUM  --  8.7* 8.9  GFRNONAA 54* 59* 60*  GFRAA >60 >60 >60  ANIONGAP  --  8 5     Hematology Recent Labs  Lab 09/06/18 0424 09/07/18 0430 09/08/18 0538  WBC 6.7 6.8 6.7  RBC 4.64 4.60 4.88  HGB 13.6 13.2 13.8  HCT 41.8 41.6 44.3  MCV 90.1 90.4 90.8  MCH 29.3 28.7 28.3  MCHC 32.5 31.7 31.2  RDW 13.1 13.0 12.8  PLT 149* 168 152    Cardiac Enzymes Recent Labs  Lab 09/05/18 2242 09/06/18 0424 09/06/18 1034  TROPONINI 0.04* 0.03* 0.03*   No results for input(s): TROPIPOC in the last 168 hours.   BNP Recent Labs  Lab 09/05/18 2242  BNP 87.7     DDimer No results for input(s): DDIMER in the last 168 hours.  Radiology    No results found.  Cardiac Studies   Cardiac Catheterization 07/29/2015 [facility]): Angiographic Findings: - LMCA: Normal. - LAD: Aneurysmal. - LCx: Normal. - RCA: Focal stenosis.  Lesion on Mid RCA: Mid subsection.25% stenosis 8 mm length .  Diagnostic Procedure Summary: - There is minor CAD. - Moderate global LV dysfunction - EF 35%  Diagnostic Procedure Recommendations: - Medical therapy for LV dysfunction.  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 Echocardiogram revealed new cardiomyopathy with EF 30-35% and he was transferred to [facility] for further cardiac work-up. Cath with no CAD Being considered for CRT-AICD   Assessment & Plan    1. Atrial  Flutter with Slow Ventricular Response- Dr Caryl Comes to see over weekend appears that he will  Need CRT device before d/c given bradycardia but this is not clear from Dr Curt Bears notes  Keep on heparin Until this is clarified  His HR dips into the 20's at night He needs more diuresis but I think it would be preferable To put device in this admission   2. Cardiomyopathy with EF of 30-35% - Echo showed LVEF of 30-35%. Cath with no CAD no right heart cath done LVEDP 24 mmHg and clinically still grossly volume overloaded  Lasix now 40 iv bid Over liter diuresis yesterday continue    For questions or updates, please contact [place] Please consult [email or web address] for contact info under      Signed, Jenkins Rouge, MD  09/08/2018, 8:49 AM

## 2018-09-08 NOTE — Progress Notes (Addendum)
Progress Note  Patient Name: Johnathan Wright Date of Encounter: 09/08/2018  Primary Cardiologist: Hilty  Primary Electrophysiologist: [facility]   Patient Profile     72 y.o. male admitted with bradycardia (heart rates 30s and 40s and found to be in atrial flutter.  While he carries a diagnosis of atrial fibrillation, review of electrocardiograms Epic back to 2017 all described atrial flutter not fibrillation.  LHC 10/16 [facility]) EF 35% without obstructive disease   known left bundle branch block Ejection fraction 30-35% described as new echocardiogram    Left heart catheterization 11/19 no obstructive disease   Morbid obesity and likely sleep apnea Diabetes with peripheral neuropathy and vasculopathy Subjective   No chest pain or shortness of breath; poor understanding  Inpatient Medications    Scheduled Meds: . allopurinol  100 mg Oral Daily  . ARIPiprazole  2 mg Oral Daily  . aspirin EC  81 mg Oral Daily  . atorvastatin  10 mg Oral Daily  . furosemide  40 mg Intravenous BID  . insulin aspart  0-15 Units Subcutaneous TID WC  . sodium chloride flush  3 mL Intravenous Q12H  . vortioxetine HBr  20 mg Oral Daily   Continuous Infusions: . sodium chloride     PRN Meds: sodium chloride, acetaminophen, acetaminophen, HYDROcodone-acetaminophen, nitroGLYCERIN, ondansetron (ZOFRAN) IV, sodium chloride flush   Vital Signs    Vitals:   09/07/18 0428 09/07/18 1349 09/07/18 2055 09/08/18 0601  BP: 137/68 117/61 127/62 (!) 117/41  Pulse: (!) 40 (!) 40 (!) 41 (!) 40  Resp: 18  17 14   Temp: 98.4 F (36.9 C) 98.6 F (37 C) 98.1 F (36.7 C) 98 F (36.7 C)  TempSrc: Oral Oral Oral Oral  SpO2: 94% 99% 98% 98%  Weight: (!) 177.3 kg   (!) 170.5 kg  Height:        Intake/Output Summary (Last 24 hours) at 09/08/2018 1115 Last data filed at 09/08/2018 0900 Gross per 24 hour  Intake 840 ml  Output 1951 ml  Net -1111 ml   Filed Weights   09/05/18 1959 09/07/18 0428 09/08/18 0601    Weight: (!) 173.7 kg (!) 177.3 kg (!) 170.5 kg    Telemetry    Atrial flutter with a atrial cycle length of 370 ms- Personally Reviewed  ECG    Atrial flutter with 4: 1 conduction- Personally Reviewed  Physical Exam    GEN: No acute distress 280 pounds.   Neck: JVD 8 cm Cardiac: RRR, no  murmurs, rubs, or gallops.  Respiratory: Clear to auscultation bilaterally. GI: Soft, nontender, non-distended  MS: 1-2+  edema; No deformity. Neuro:  Nonfocal  Psych: Normal affect  Skin Warm and dry; healing wound on his left leg  Labs    Chemistry Recent Labs  Lab 09/05/18 2242 09/06/18 0424 09/07/18 0430  NA  --  140 138  K  --  4.0 4.3  CL  --  104 104  CO2  --  28 29  GLUCOSE  --  127* 128*  BUN  --  21 18  CREATININE 1.26* 1.18 1.17  CALCIUM  --  8.7* 8.9  GFRNONAA 54* 59* 60*  GFRAA >60 >60 >60  ANIONGAP  --  8 5     Hematology Recent Labs  Lab 09/06/18 0424 09/07/18 0430 09/08/18 0538  WBC 6.7 6.8 6.7  RBC 4.64 4.60 4.88  HGB 13.6 13.2 13.8  HCT 41.8 41.6 44.3  MCV 90.1 90.4 90.8  MCH 29.3 28.7  28.3  MCHC 32.5 31.7 31.2  RDW 13.1 13.0 12.8  PLT 149* 168 152    Cardiac Enzymes Recent Labs  Lab 09/05/18 2242 09/06/18 0424 09/06/18 1034  TROPONINI 0.04* 0.03* 0.03*   No results for input(s): TROPIPOC in the last 168 hours.   BNP Recent Labs  Lab 09/05/18 2242  BNP 87.7     DDimer No results for input(s): DDIMER in the last 168 hours.   Radiology    No results found.  Cardiac Studies   As above   Assessment & Plan    Atrial flutter with a slow atrial cycle length-persistent  Left bundle branch block  Cardiomyopathy-nonischemic-chronic  Medical therapy limited by bradycardia   The patient has ongoing atrial flutter dating back a couple of years.  It is unclear what his sinus node function is like underlying; hence, prior to proceeding with pacemaker implantation would favor illumination of the flutter circuit.  I reviewed with  Dr. Elliot Cousin who agrees with this approach.  The alternative would be to proceed with device implantation and then undertake flutter ablation.  It seems the former approach allows potentially initiation of appropriate medical therapy for his cardiomyopathy which is currently limited.  We will begin anticoagulation with apixaban; we will schedule him for TEE tomorrow and flutter ablation/cardioversion thereafter.    I will begin therapy with low-dose ARB and Aldactone, the combination instead of Entresto for maximal benefit.  Reviewed with patient.  In the event that he needs device implantation as noted by Dr. Carlyn Reichert, CRT would be appropriate.  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  Hence, would anticipate long-term or least intermediate term anticoagulation     Signed, Virl Axe, MD  09/08/2018, 11:15 AM

## 2018-09-09 ENCOUNTER — Inpatient Hospital Stay (HOSPITAL_COMMUNITY): Payer: Medicare HMO | Admitting: Certified Registered"

## 2018-09-09 ENCOUNTER — Encounter (HOSPITAL_COMMUNITY)
Admission: AD | Disposition: A | Discharge status: Home Health | Payer: Self-pay | Source: Other Acute Inpatient Hospital | Attending: Cardiology

## 2018-09-09 ENCOUNTER — Encounter: Payer: Medicare HMO | Admitting: Podiatry

## 2018-09-09 ENCOUNTER — Encounter (HOSPITAL_COMMUNITY): Payer: Self-pay | Admitting: Cardiovascular Disease

## 2018-09-09 ENCOUNTER — Inpatient Hospital Stay (HOSPITAL_COMMUNITY)
Admission: AD | Admit: 2018-09-09 | Discharge: 2018-09-09 | Disposition: A | Discharge status: Home / Self Care | Payer: Medicare HMO | Source: Other Acute Inpatient Hospital | Attending: Internal Medicine | Admitting: Internal Medicine

## 2018-09-09 DIAGNOSIS — I4892 Unspecified atrial flutter: Secondary | ICD-10-CM

## 2018-09-09 DIAGNOSIS — I4891 Unspecified atrial fibrillation: Secondary | ICD-10-CM

## 2018-09-09 DIAGNOSIS — I361 Nonrheumatic tricuspid (valve) insufficiency: Secondary | ICD-10-CM

## 2018-09-09 DIAGNOSIS — I5022 Chronic systolic (congestive) heart failure: Secondary | ICD-10-CM

## 2018-09-09 HISTORY — PX: TEE WITHOUT CARDIOVERSION: SHX5443

## 2018-09-09 HISTORY — PX: BIV ICD INSERTION CRT-D: EP1195

## 2018-09-09 LAB — CBC
HCT: 44.9 % (ref 39.0–52.0)
Hemoglobin: 14.3 g/dL (ref 13.0–17.0)
MCH: 28.2 pg (ref 26.0–34.0)
MCHC: 31.8 g/dL (ref 30.0–36.0)
MCV: 88.6 fL (ref 80.0–100.0)
Platelets: 190 10*3/uL (ref 150–400)
RBC: 5.07 MIL/uL (ref 4.22–5.81)
RDW: 13 % (ref 11.5–15.5)
WBC: 7.1 10*3/uL (ref 4.0–10.5)
nRBC: 0 % (ref 0.0–0.2)

## 2018-09-09 LAB — BASIC METABOLIC PANEL
Anion gap: 10 (ref 5–15)
BUN: 22 mg/dL (ref 8–23)
CO2: 28 mmol/L (ref 22–32)
Calcium: 9 mg/dL (ref 8.9–10.3)
Chloride: 101 mmol/L (ref 98–111)
Creatinine, Ser: 1.34 mg/dL — ABNORMAL HIGH (ref 0.61–1.24)
GFR calc Af Amer: 59 mL/min — ABNORMAL LOW (ref >60)
GFR calc non Af Amer: 51 mL/min — ABNORMAL LOW (ref >60)
Glucose, Bld: 129 mg/dL — ABNORMAL HIGH (ref 70–99)
Potassium: 4 mmol/L (ref 3.5–5.1)
Sodium: 139 mmol/L (ref 135–145)

## 2018-09-09 LAB — GLUCOSE, CAPILLARY
Glucose-Capillary: 124 mg/dL — ABNORMAL HIGH (ref 70–99)
Glucose-Capillary: 133 mg/dL — ABNORMAL HIGH (ref 70–99)
Glucose-Capillary: 118 mg/dL — ABNORMAL HIGH (ref 70–99)
Glucose-Capillary: 198 mg/dL — ABNORMAL HIGH (ref 70–99)

## 2018-09-09 SURGERY — BIV ICD INSERTION CRT-D

## 2018-09-09 SURGERY — ECHOCARDIOGRAM, TRANSESOPHAGEAL
Anesthesia: Monitor Anesthesia Care

## 2018-09-09 MED ORDER — IOPAMIDOL (ISOVUE-370) INJECTION 76%
INTRAVENOUS | Status: DC | PRN
  Administered 2018-09-09: 25 mL via INTRAVENOUS

## 2018-09-09 MED ORDER — MIDAZOLAM HCL 5 MG/5ML IJ SOLN
INTRAMUSCULAR | Status: AC
  Filled 2018-09-09: qty 5

## 2018-09-09 MED ORDER — LIDOCAINE HCL (PF) 1 % IJ SOLN
INTRAMUSCULAR | Status: DC | PRN
  Administered 2018-09-09: 60 mL

## 2018-09-09 MED ORDER — HEPARIN (PORCINE) IN NACL 1000-0.9 UT/500ML-% IV SOLN
INTRAVENOUS | Status: DC | PRN
  Administered 2018-09-09: 500 mL

## 2018-09-09 MED ORDER — PROPOFOL 500 MG/50ML IV EMUL
INTRAVENOUS | Status: DC | PRN
  Administered 2018-09-09: 75 ug/kg/min via INTRAVENOUS

## 2018-09-09 MED ORDER — HEPARIN BOLUS VIA INFUSION
2000.0000 [IU] | Freq: Once | INTRAVENOUS | Status: AC
  Administered 2018-09-09: 2000 [IU] via INTRAVENOUS
  Filled 2018-09-09: qty 2000

## 2018-09-09 MED ORDER — CHLORHEXIDINE GLUCONATE 4 % EX LIQD
60.0000 mL | Freq: Once | CUTANEOUS | Status: AC
  Administered 2018-09-09: 4 via TOPICAL

## 2018-09-09 MED ORDER — DEXTROSE 5 % IV SOLN
3.0000 g | INTRAVENOUS | Status: DC
  Filled 2018-09-09: qty 3000

## 2018-09-09 MED ORDER — ONDANSETRON HCL 4 MG/2ML IJ SOLN: 4.0000 mg | Freq: Four times a day (QID) | INTRAMUSCULAR | Status: DC | PRN

## 2018-09-09 MED ORDER — IOPAMIDOL (ISOVUE-370) INJECTION 76%
INTRAVENOUS | Status: AC
  Filled 2018-09-09: qty 50

## 2018-09-09 MED ORDER — PERFLUTREN LIPID MICROSPHERE
INTRAVENOUS | Status: AC
  Filled 2018-09-09: qty 10

## 2018-09-09 MED ORDER — CHLORHEXIDINE GLUCONATE 4 % EX LIQD: 60.0000 mL | Freq: Once | CUTANEOUS | Status: DC

## 2018-09-09 MED ORDER — CEFAZOLIN SODIUM-DEXTROSE 2-4 GM/100ML-% IV SOLN
2.0000 g | Freq: Four times a day (QID) | INTRAVENOUS | Status: AC
  Administered 2018-09-09 – 2018-09-10 (×3): 2 g via INTRAVENOUS
  Filled 2018-09-09 (×4): qty 100

## 2018-09-09 MED ORDER — LIDOCAINE HCL (PF) 1 % IJ SOLN
INTRAMUSCULAR | Status: AC
  Filled 2018-09-09: qty 60

## 2018-09-09 MED ORDER — CHLORHEXIDINE GLUCONATE 4 % EX LIQD
CUTANEOUS | Status: AC
  Administered 2018-09-09: 4 via TOPICAL
  Filled 2018-09-09: qty 15

## 2018-09-09 MED ORDER — ACETAMINOPHEN 325 MG PO TABS: 325.0000 mg | ORAL_TABLET | ORAL | Status: DC | PRN

## 2018-09-09 MED ORDER — PROPOFOL 10 MG/ML IV BOLUS
INTRAVENOUS | Status: DC | PRN
  Administered 2018-09-09 (×2): 20 mg via INTRAVENOUS

## 2018-09-09 MED ORDER — BUTAMBEN-TETRACAINE-BENZOCAINE 2-2-14 % EX AERO
INHALATION_SPRAY | CUTANEOUS | Status: DC | PRN
  Administered 2018-09-09: 2 via TOPICAL

## 2018-09-09 MED ORDER — FENTANYL CITRATE (PF) 100 MCG/2ML IJ SOLN
INTRAMUSCULAR | Status: AC
  Filled 2018-09-09: qty 2

## 2018-09-09 MED ORDER — HEPARIN (PORCINE) IN NACL 1000-0.9 UT/500ML-% IV SOLN
INTRAVENOUS | Status: AC
  Filled 2018-09-09: qty 500

## 2018-09-09 MED ORDER — FENTANYL CITRATE (PF) 100 MCG/2ML IJ SOLN
INTRAMUSCULAR | Status: DC | PRN
  Administered 2018-09-09: 12.5 ug via INTRAVENOUS
  Administered 2018-09-09: 25 ug via INTRAVENOUS
  Administered 2018-09-09 (×2): 12.5 ug via INTRAVENOUS
  Administered 2018-09-09: 25 ug via INTRAVENOUS
  Administered 2018-09-09: 12.5 ug via INTRAVENOUS

## 2018-09-09 MED ORDER — SODIUM CHLORIDE 0.9 % IV SOLN
80.0000 mg | INTRAVENOUS | Status: AC
  Administered 2018-09-09: 80 mg
  Filled 2018-09-09: qty 2

## 2018-09-09 MED ORDER — MIDAZOLAM HCL 5 MG/5ML IJ SOLN
INTRAMUSCULAR | Status: DC | PRN
  Administered 2018-09-09: 1 mg via INTRAVENOUS
  Administered 2018-09-09: 2 mg via INTRAVENOUS
  Administered 2018-09-09 (×4): 1 mg via INTRAVENOUS

## 2018-09-09 MED ORDER — SODIUM CHLORIDE 0.9 % IV SOLN
INTRAVENOUS | Status: DC
  Administered 2018-09-09: 14:00:00 via INTRAVENOUS

## 2018-09-09 MED ORDER — PERFLUTREN LIPID MICROSPHERE
1.0000 mL | INTRAVENOUS | Status: DC | PRN
  Administered 2018-09-09: 2 mL via INTRAVENOUS
  Filled 2018-09-09: qty 10

## 2018-09-09 MED ORDER — HEPARIN (PORCINE) 25000 UT/250ML-% IV SOLN
1800.0000 [IU]/h | INTRAVENOUS | Status: DC
  Administered 2018-09-09: 1800 [IU]/h via INTRAVENOUS
  Filled 2018-09-09: qty 250

## 2018-09-09 SURGICAL SUPPLY — 21 items
CABLE SURGICAL S-101-97-12 (CABLE) ×2 IMPLANT
CATH ACUITYPRO 45CM EH 9F (CATHETERS) ×2 IMPLANT
CATH ACUITYPRO IC 90 7F (CATHETERS) ×1
CATH GD CS-IC 90 CVD 60X7FR (CATHETERS) ×1 IMPLANT
CATH HEX JOS 2-5-2 65CM 6F REP (CATHETERS) ×2 IMPLANT
HOVERMATT SINGLE USE (MISCELLANEOUS) ×2 IMPLANT
ICD VIGILANT DF4 G247 (ICD Generator) ×2 IMPLANT
INGEVITY MRI 7741-52CM (Lead) ×2 IMPLANT
KIT ESSENTIALS PG (KITS) ×2 IMPLANT
LEAD ACUITY X4 4674 (Lead) ×2 IMPLANT
LEAD PACING INGEVITY MRI 52CM (Lead) ×1 IMPLANT
LEAD RELIANCE G DF4 0293 (Lead) ×2 IMPLANT
PAD PRO RADIOLUCENT 2001M-C (PAD) ×2 IMPLANT
SHEATH CLASSIC 7F (SHEATH) ×2 IMPLANT
SHEATH CLASSIC 9.5F (SHEATH) ×2 IMPLANT
SHEATH CLASSIC 9F (SHEATH) ×2 IMPLANT
SHIELD RADPAD SCOOP 12X17 (MISCELLANEOUS) ×4 IMPLANT
TRAY PACEMAKER INSERTION (PACKS) ×2 IMPLANT
WIRE ACUITY WHISPER EDS 4648 (WIRE) ×2 IMPLANT
WIRE LUGE 182CM (WIRE) ×2 IMPLANT
WIRE MAILMAN 182CM (WIRE) ×2 IMPLANT

## 2018-09-09 NOTE — Interval H&P Note (Signed)
History and Physical Interval Note:  09/09/2018 2:53 PM  Johnathan Wright  has presented today for surgery, with the diagnosis of aflutter  The various methods of treatment have been discussed with the patient and family. After consideration of risks, benefits and other options for treatment, the patient has consented to  Procedure(s): BIV ICD INSERTION CRT-D (N/A) as a surgical intervention .  The patient's history has been reviewed, patient examined, no change in status, stable for surgery.  I have reviewed the patient's chart and labs.  Questions were answered to the patient's satisfaction.     Cristopher Peru

## 2018-09-09 NOTE — CV Procedure (Signed)
    PROCEDURE NOTE:  Procedure:  Transesophageal echocardiogram Operator:  Fransico Him, MD Indications:  Atrial flutter Complications: None  During this procedure the patient is administered a total of Propofol 310 mg to achieve and maintain moderate conscious sedation.  The patient's heart rate, blood pressure, and oxygen saturation are monitored continuously during the procedure by anesthesia  Results: Dilatedl LV with severe LV dysfunction with EF 25-30% with diffuse hypokinesis Normal RV size and function Normal RA Severely dilated  LA with significant spontaneous echo contrast throughout.  The LA appendage is not well visualized due to shadowing form a prominent ridge.  There are no obvious large thrombi in the appendage but cannot rule out small thrombus with absolute certainty even with the use of definity contrast.  The emptying velocity is 40-60cm/sec which is good. Normal TV with mild TR Normal PV with trivial PR Normal MV with trivial MR Moderately thickened trileaflet AV Lipomatous interatrial septum with no evidence of shunt by colorflow dopper  Normal thoracic and ascending aorta.  The patient tolerated the procedure well and was transferred back to their room in stable condition.  Signed: Fransico Him, MD [facility]

## 2018-09-09 NOTE — Progress Notes (Signed)
ANTICOAGULATION CONSULT NOTE - Follow Up Consult  Pharmacy Consult for heparin Indication: atrial fibrillation  Labs: Recent Labs    09/06/18 1034  09/07/18 0430 09/08/18 0538 09/09/18 0411  HGB  --    < > 13.2 13.8 14.3  HCT  --   --  41.6 44.3 44.9  PLT  --   --  168 152 190  CREATININE  --   --  1.17  --   --   TROPONINI 0.03*  --   --   --   --    < > = values in this interval not displayed.    Assessment: 72yoM with AFib to start on IV heparin. Discussed with EP team and they request only a half-dose bolus at pt will potentially need EP procedures today. Pt previously subtherapeutic on 1700 units/hr.  Goal of Therapy:  Heparin level 0.3-0.7 units/ml   Plan:  -Heparin 2000 units x1 then 1800 units/hr -Check heparin level in 8 hours  Arrie Senate, PharmD, BCPS Clinical Pharmacist [phone] Please check AMION for all [place] numbers 09/09/2018

## 2018-09-09 NOTE — Anesthesia Postprocedure Evaluation (Signed)
Anesthesia Post Note  Patient: Johnathan Wright  Procedure(s) Performed: TRANSESOPHAGEAL ECHOCARDIOGRAM (TEE) (N/A )     Patient location during evaluation: PACU Anesthesia Type: MAC Level of consciousness: awake and alert Pain management: pain level controlled Vital Signs Assessment: post-procedure vital signs reviewed and stable Respiratory status: spontaneous breathing, nonlabored ventilation, respiratory function stable and patient connected to nasal cannula oxygen Cardiovascular status: stable and blood pressure returned to baseline Postop Assessment: no apparent nausea or vomiting Anesthetic complications: no    Last Vitals:  Vitals:   09/09/18 1137 09/09/18 1401  BP: 125/68 124/61  Pulse:  (!) 43  Resp:    Temp:  36.9 C  SpO2:  95%    Last Pain:  Vitals:   09/09/18 1401  TempSrc: Oral  PainSc:                  Elianie Hubers DAVID

## 2018-09-09 NOTE — Transfer of Care (Signed)
Immediate Anesthesia Transfer of Care Note  Patient: Johnathan Wright  Procedure(s) Performed: TRANSESOPHAGEAL ECHOCARDIOGRAM (TEE) (N/A )  Patient Location: Endoscopy Unit  Anesthesia Type:MAC  Level of Consciousness: drowsy and patient cooperative  Airway & Oxygen Therapy: Patient Spontanous Breathing and Patient connected to nasal cannula oxygen  Post-op Assessment: Report given to RN and Post -op Vital signs reviewed and stable  Post vital signs: Reviewed and stable  Last Vitals:  Vitals Value Taken Time  BP 74/26 09/09/2018 11:03 AM  Temp 36.4 C 09/09/2018 11:02 AM  Pulse 42 09/09/2018 11:05 AM  Resp 12 09/09/2018 11:05 AM  SpO2 99 % 09/09/2018 11:05 AM  Vitals shown include unvalidated device data.  Last Pain:  Vitals:   09/09/18 1102  TempSrc: Oral  PainSc: 0-No pain      Patients Stated Pain Goal: 0 (90/30/09 2330)  Complications: No apparent anesthesia complications

## 2018-09-09 NOTE — Progress Notes (Addendum)
Progress Note  Patient Name: Johnathan Wright Date of Encounter: 09/09/2018  Primary Cardiologist: No primary care provider on file. Has seen Dr. Geraldo Pitter in 2016. Lives in [place] and wants to follow there.   Subjective   Johnathan Wright says that he is feeling fairly well.  He denies shortness of breath.  Although he cannot feel his lower legs, his wife states that his lower extremity edema is a little improved.  The patient does not use his legs and only gets around minimally with the use of a walker, so he says that he rarely has exertional symptoms as he does not get to exert himself very much.  Inpatient Medications    Scheduled Meds: . [MAR Hold] allopurinol  100 mg Oral Daily  . [MAR Hold] ARIPiprazole  2 mg Oral Daily  . [MAR Hold] atorvastatin  10 mg Oral Daily  . [MAR Hold] furosemide  40 mg Intravenous BID  . gentamicin irrigation  80 mg Irrigation On Call  . [MAR Hold] insulin aspart  0-15 Units Subcutaneous TID WC  . [MAR Hold] sodium chloride flush  3 mL Intravenous Q12H  . [MAR Hold] vortioxetine HBr  20 mg Oral Daily   Continuous Infusions: . [MAR Hold] sodium chloride    . sodium chloride 20 mL/hr at 09/08/18 2354  . sodium chloride Stopped (09/09/18 1057)  .  ceFAZolin (ANCEF) IV    . heparin 1,800 Units/hr (09/09/18 0900)   PRN Meds: [MAR Hold] sodium chloride, [MAR Hold] acetaminophen, [MAR Hold] acetaminophen, [MAR Hold] HYDROcodone-acetaminophen, [MAR Hold] nitroGLYCERIN, [MAR Hold] ondansetron (ZOFRAN) IV, perflutren lipid microspheres (DEFINITY) IV suspension, [MAR Hold] sodium chloride flush   Vital Signs    Vitals:   09/09/18 0957 09/09/18 1102 09/09/18 1105 09/09/18 1107  BP:  (!) 85/59 (!) 74/26 (!) 100/49  Pulse:  (!) 38 (!) 41 (!) 41  Resp:  15 11 10   Temp: 98.1 F (36.7 C) 97.6 F (36.4 C)    TempSrc: Oral Oral    SpO2:  97% 98% 96%  Weight:      Height:        Intake/Output Summary (Last 24 hours) at 09/09/2018 1119 Last data filed  at 09/09/2018 1057 Gross per 24 hour  Intake 1099.55 ml  Output 3450 ml  Net -2350.45 ml   Filed Weights   09/07/18 0428 09/08/18 0601 09/09/18 0434  Weight: (!) 177.3 kg (!) 170.5 kg (!) 167.3 kg    Telemetry    Atrial flutter in the 40s-50s with dips into the 30s- Personally Reviewed  ECG    No new tracings- Personally Reviewed  Physical Exam   GEN:  Obese male, no acute distress.   Neck: No JVD Cardiac:  Irregularly irregular, bradycardic, no murmurs, rubs, or gallops.  Respiratory: Clear to auscultation bilaterally. GI: Soft, nontender, non-distended  MS:  Trace-1+ lower leg edema; left lower leg is wrapped.  Amputation of toes on right foot Neuro:  Nonfocal  Psych: Normal affect   Labs    Chemistry Recent Labs  Lab 09/05/18 2242 09/06/18 0424 09/07/18 0430  NA  --  140 138  K  --  4.0 4.3  CL  --  104 104  CO2  --  28 29  GLUCOSE  --  127* 128*  BUN  --  21 18  CREATININE 1.26* 1.18 1.17  CALCIUM  --  8.7* 8.9  GFRNONAA 54* 59* 60*  GFRAA >60 >60 >60  ANIONGAP  --  8 5  Hematology Recent Labs  Lab 09/07/18 0430 09/08/18 0538 09/09/18 0411  WBC 6.8 6.7 7.1  RBC 4.60 4.88 5.07  HGB 13.2 13.8 14.3  HCT 41.6 44.3 44.9  MCV 90.4 90.8 88.6  MCH 28.7 28.3 28.2  MCHC 31.7 31.2 31.8  RDW 13.0 12.8 13.0  PLT 168 152 190    Cardiac Enzymes Recent Labs  Lab 09/05/18 2242 09/06/18 0424 09/06/18 1034  TROPONINI 0.04* 0.03* 0.03*   No results for input(s): TROPIPOC in the last 168 hours.   BNP Recent Labs  Lab 09/05/18 2242  BNP 87.7     DDimer No results for input(s): DDIMER in the last 168 hours.   Radiology    No results found.  Cardiac Studies   LEFT HEART CATH AND CORONARY ANGIOGRAPHY 09/06/2018  Conclusion    Prox LAD lesion is 20% stenosed.  No significant coronary obstructive disease with mild 20% irregularity in the LAD after the takeoff of the first diagonal vessel; normal ramus intermediate, left circumflex, and  RCA.  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  LVEDP 23 mm.  RECOMMENDATION: Guideline directed medical therapy for HFrEF.  The patient should undergo an evaluation for obstructive sleep apnea since in the laboratory he clearly had apneic events with desaturation and had significant intrathoracic pressure changes during his breathing spells.  Hhe will be undergoing BiVICD placement later today  Recommend Aspirin 81mg  daily for moderate CAD.   Cardiac Catheterization 07/29/2015 [facility]): Angiographic Findings: - LMCA: Normal. - LAD: Aneurysmal. - LCx: Normal. - RCA: Focal stenosis.  Lesion on Mid RCA: Mid subsection.25% stenosis 8 mm length .  Diagnostic Procedure Summary: - There is minor CAD. - Moderate global LV dysfunction - EF 35%  Diagnostic Procedure Recommendations: - Medical therapy for LV dysfunction.  Patient Profile     72 y.o. 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 Echocardiogram revealed new cardiomyopathy with EF 30-35% and he was transferred to [facility] for further cardiac work-up. Cath with no CAD Being considered for CRT-AICD.  Assessment & Plan    Atrial flutter with slow ventricular response -Several years of atrial flutter. Now with rates in the 40's  And dipping into the 30's -EF 30-35%. LBBB. Morbid obesity. Likely OSA.  -Seen by Dr. Caryl Comes 11/17. Discussed illumination of the flutter circuit before proceeding with pacemaker implantation. Plan was to begin anticoagulation, check TEE and follow with ablation/cardioversion after, however, TEE done today could not rule out small thrombus with absolute certainty even with the use of definity contrast. So will probably not be able to proceed with ablation.  -Per Dr. Caryl Comes and Dr. Curt Bears,  if device is needed would consider CRT-P with his CM and LBBB. A device would allow for titration of meds to treat Cardiomyopathy.  -Pt is NPO. Will check with EP as to whether he will undergo PPM today.   Cardiomyopathy with EF 30-35% -Left heart catheterization 11/19 no obstructive disease. LVEDP 24 mmHg and clinically volume overloaded.  -Diuresing with IV lasix 40 mg BID. 3L UOP in prior 24 hrs and net negative 3.5L fluid balance since admission. Wt is down significantly to 167.3 kg from a high of 177.3 kg.  -Continue with current diuresis -No metabolic labs since 67/89/38. Will order daily BMet while diuresing.   For questions or updates, please contact [place] Please  consult [email or web address] for contact info under    Signed, Daune Perch, NP  09/09/2018, 11:19 AM    Patient seen   Agree with plans as noted by Maryla Morrow above    Pt to have BiV ICD today   Dorris Carnes

## 2018-09-09 NOTE — Consult Note (Signed)
[place] Nurse wound consult note Reason for Consult:Trauma wound from fall to left anterior lower leg.  Wound type:trauma Pressure Injury POA: NA Measurement: 3 cm x 2 cm x 0.2 cm  Wound OFH:QRFXJ red Drainage (amount, consistency, odor) minimal serosanguinous  No  Periwound:Edema.  Wears Unna boot to facilitate healing and manage edema. Dressing procedure/placement/frequency: Clean left anterior lower leg with NS.  Apply Xeroform gauze to wound bed.  Cover with dry dressing.  Bedside RN to perform  Then, ortho tech to wrap from below toes to below knee with zinc layer secured with self adherent coban.  Change Monday and Thursday.  Will not follow at this time.  Please re-consult if needed.  Domenic Moras MSN, RN, FNP-BC CWON Wound, Ostomy, Continence Nurse Pager [phone]

## 2018-09-09 NOTE — Progress Notes (Addendum)
Electrophysiology Rounding Note  Patient Name: Johnathan Wright Date of Encounter: 09/09/2018  Primary Cardiologist: Hilty Electrophysiologist: Curt Bears   Subjective   The patient is doing well today.  At this time, the patient denies chest pain, shortness of breath, or any new concerns.  Inpatient Medications    Scheduled Meds: . allopurinol  100 mg Oral Daily  . ARIPiprazole  2 mg Oral Daily  . atorvastatin  10 mg Oral Daily  . furosemide  40 mg Intravenous BID  . insulin aspart  0-15 Units Subcutaneous TID WC  . sodium chloride flush  3 mL Intravenous Q12H  . vortioxetine HBr  20 mg Oral Daily   Continuous Infusions: . sodium chloride    . heparin 1,800 Units/hr (09/09/18 0900)   PRN Meds: sodium chloride, acetaminophen, acetaminophen, HYDROcodone-acetaminophen, nitroGLYCERIN, ondansetron (ZOFRAN) IV, sodium chloride flush   Vital Signs    Vitals:   09/09/18 1105 09/09/18 1107 09/09/18 1110 09/09/18 1120  BP: (!) 74/26 (!) 100/49 (!) 103/47 121/60  Pulse: (!) 41 (!) 41 (!) 40 (!) 40  Resp: 11 10 17 13   Temp:      TempSrc:      SpO2: 98% 96% 95% 97%  Weight:      Height:        Intake/Output Summary (Last 24 hours) at 09/09/2018 1147 Last data filed at 09/09/2018 1057 Gross per 24 hour  Intake 1099.55 ml  Output 3450 ml  Net -2350.45 ml   Filed Weights   09/07/18 0428 09/08/18 0601 09/09/18 0434  Weight: (!) 177.3 kg (!) 170.5 kg (!) 167.3 kg    Physical Exam    GEN- The patient is obese appearing, alert and oriented x 3 today.   Head- normocephalic, atraumatic Eyes-  Sclera clear, conjunctiva pink Ears- hearing intact Oropharynx- clear Neck- supple Lungs- Clear to ausculation bilaterally, normal work of breathing Heart- Bradycardic regular rate and rhythm  GI- soft, NT, ND, + BS Extremities- no clubbing, cyanosis, or edema Skin- no rash or lesion Psych- euthymic mood, full affect Neuro- strength and sensation are intact  Labs     CBC Recent Labs    09/08/18 0538 09/09/18 0411  WBC 6.7 7.1  HGB 13.8 14.3  HCT 44.3 44.9  MCV 90.8 88.6  PLT 152 093   Basic Metabolic Panel Recent Labs    09/07/18 0430  NA 138  K 4.3  CL 104  CO2 29  GLUCOSE 128*  BUN 18  CREATININE 1.17  CALCIUM 8.9     Telemetry    Atrial flutter with V rates 40's (personally reviewed)  Radiology    No results found.   Assessment & Plan    1.  Typical atrial flutter with slow ventricular rate Plan TEE/flutter ablation today  After ablation will convert Heparin to Eliquis  2.  Bradycardia If remains bradycardic post ablation, will need CRTP Risks, benefits reviewed with patient who wishes to proceed if necessary  3.  Chronic systolic heart failure/LBBB CRTP as above if remains bradycardic post ablation   For questions or updates, please contact [place] Please consult [email or web address] for contact info under Cardiology/STEMI.  Signed, Chanetta Marshall, NP  09/09/2018, 11:47 AM   EP attending Patient seen and examined.  Agree with the findings as noted above.  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  He is undergone TEE which could not be ruled out for a  left atrial appendage thrombus.  Unfortunately his ventricular rates are slow and he will do poorly without back-up pacing.  Because his ejection fraction is 25 to 30%, and because of the concern that it will not improve, it would be most appropriate at this point time despite a lack of medical therapy for his heart failure to proceed with a by V ICD insertion rather than place a biventricular pacemaker have to come back later to upgrade the patient.  Once he has been systemically anticoagulated, we would consider flutter ablation as an outpatient unless he reverts to sinus rhythm.  I discussed all the above with the patient including the risks, goals, benefits, and expectations of  biventricular ICD insertion and the patient is willing to proceed.  Cristopher Peru, MD

## 2018-09-09 NOTE — Anesthesia Preprocedure Evaluation (Addendum)
Anesthesia Evaluation  Patient identified by MRN, date of birth, ID band Patient awake    Reviewed: Allergy & Precautions, NPO status , Patient's Chart, lab work & pertinent test results  History of Anesthesia Complications Negative for: history of anesthetic complications  Airway Mallampati: II  TM Distance: >3 FB Neck ROM: Full    Dental  (+) Dental Advisory Given, Teeth Intact   Pulmonary neg pulmonary ROS, former smoker,    breath sounds clear to auscultation       Cardiovascular +CHF  + dysrhythmias Atrial Fibrillation  Rhythm:Regular Rate:Bradycardia   '19 Cath - Prox LAD lesion is 20% stenosed.  EKG - Marked bradycardia 40 bpm, appears junctional. Rhythm is regular except 1 ectopic with compensitory pause. 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)    GI/Hepatic Neg liver ROS,  Dysphagia    Endo/Other  diabetes, Type 2, Oral Hypoglycemic AgentsMorbid obesity  Renal/GU negative Renal ROS     Musculoskeletal  Gout    Abdominal (+) + obese,   Peds  Hematology negative hematology ROS (+)   Anesthesia Other Findings Dysphonia   Reproductive/Obstetrics                            Anesthesia Physical Anesthesia Plan  ASA: III  Anesthesia Plan: MAC   Post-op Pain Management:    Induction: Intravenous  PONV Risk Score and Plan: 1 and Propofol infusion and Treatment may vary due to age or medical condition  Airway Management Planned: Nasal Cannula and Natural Airway  Additional Equipment: None  Intra-op Plan:   Post-operative Plan:   Informed Consent: I have reviewed the patients History and Physical, chart, labs and discussed the procedure including the risks, benefits and alternatives for the proposed anesthesia with the patient or authorized representative who has  indicated his/her understanding and acceptance.   Dental advisory given  Plan Discussed with: CRNA and Anesthesiologist  Anesthesia Plan Comments:        Anesthesia Quick Evaluation

## 2018-09-09 NOTE — Plan of Care (Signed)
  Problem: Education: Goal: Knowledge of General Education information will improve Description Including pain rating scale, medication(s)/side effects and non-pharmacologic comfort measures Outcome: Progressing   Problem: Health Behavior/Discharge Planning: Goal: Ability to manage health-related needs will improve Outcome: Progressing   Problem: Clinical Measurements: Goal: Ability to maintain clinical measurements within normal limits will improve Outcome: Progressing Goal: Diagnostic test results will improve Outcome: Progressing Goal: Cardiovascular complication will be avoided Outcome: Progressing   Problem: Activity: Goal: Risk for activity intolerance will decrease Outcome: Progressing   Problem: Nutrition: Goal: Adequate nutrition will be maintained Outcome: Progressing   Problem: Coping: Goal: Level of anxiety will decrease Outcome: Progressing   Problem: Elimination: Goal: Will not experience complications related to bowel motility Outcome: Progressing Goal: Will not experience complications related to urinary retention Outcome: Progressing   Problem: Pain Managment: Goal: General experience of comfort will improve Outcome: Progressing   Problem: Safety: Goal: Ability to remain free from injury will improve Outcome: Progressing   Problem: Skin Integrity: Goal: Risk for impaired skin integrity will decrease Outcome: Progressing   Problem: Education: Goal: Understanding of CV disease, CV risk reduction, and recovery process will improve Outcome: Progressing Goal: Individualized Educational Video(s) Outcome: Progressing   Problem: Activity: Goal: Ability to return to baseline activity level will improve Outcome: Progressing   Problem: Cardiovascular: Goal: Ability to achieve and maintain adequate cardiovascular perfusion will improve Outcome: Progressing Goal: Vascular access site(s) Level 0-1 will be maintained Outcome: Progressing   Problem:  Health Behavior/Discharge Planning: Goal: Ability to safely manage health-related needs after discharge will improve Outcome: Progressing

## 2018-09-09 NOTE — Interval H&P Note (Signed)
History and Physical Interval Note:  09/09/2018 9:14 AM  Johnathan Wright  has presented today for surgery, with the diagnosis of atrial fibrillation  The various methods of treatment have been discussed with the patient and family. After consideration of risks, benefits and other options for treatment, the patient has consented to  Procedure(s): TRANSESOPHAGEAL ECHOCARDIOGRAM (TEE) (N/A) as a surgical intervention .  The patient's history has been reviewed, patient examined, no change in status, stable for surgery.  I have reviewed the patient's chart and labs.  Questions were answered to the patient's satisfaction.     Fransico Him

## 2018-09-09 NOTE — H&P (View-Only) (Signed)
Electrophysiology Rounding Note  Patient Name: Johnathan Wright Date of Encounter: 09/09/2018  Primary Cardiologist: Hilty Electrophysiologist: Curt Bears   Subjective   The patient is doing well today.  At this time, the patient denies chest pain, shortness of breath, or any new concerns.  Inpatient Medications    Scheduled Meds: . allopurinol  100 mg Oral Daily  . ARIPiprazole  2 mg Oral Daily  . atorvastatin  10 mg Oral Daily  . furosemide  40 mg Intravenous BID  . insulin aspart  0-15 Units Subcutaneous TID WC  . sodium chloride flush  3 mL Intravenous Q12H  . vortioxetine HBr  20 mg Oral Daily   Continuous Infusions: . sodium chloride    . heparin 1,800 Units/hr (09/09/18 0900)   PRN Meds: sodium chloride, acetaminophen, acetaminophen, HYDROcodone-acetaminophen, nitroGLYCERIN, ondansetron (ZOFRAN) IV, sodium chloride flush   Vital Signs    Vitals:   09/09/18 1105 09/09/18 1107 09/09/18 1110 09/09/18 1120  BP: (!) 74/26 (!) 100/49 (!) 103/47 121/60  Pulse: (!) 41 (!) 41 (!) 40 (!) 40  Resp: 11 10 17 13   Temp:      TempSrc:      SpO2: 98% 96% 95% 97%  Weight:      Height:        Intake/Output Summary (Last 24 hours) at 09/09/2018 1147 Last data filed at 09/09/2018 1057 Gross per 24 hour  Intake 1099.55 ml  Output 3450 ml  Net -2350.45 ml   Filed Weights   09/07/18 0428 09/08/18 0601 09/09/18 0434  Weight: (!) 177.3 kg (!) 170.5 kg (!) 167.3 kg    Physical Exam    GEN- The patient is obese appearing, alert and oriented x 3 today.   Head- normocephalic, atraumatic Eyes-  Sclera clear, conjunctiva pink Ears- hearing intact Oropharynx- clear Neck- supple Lungs- Clear to ausculation bilaterally, normal work of breathing Heart- Bradycardic regular rate and rhythm  GI- soft, NT, ND, + BS Extremities- no clubbing, cyanosis, or edema Skin- no rash or lesion Psych- euthymic mood, full affect Neuro- strength and sensation are intact  Labs     CBC Recent Labs    09/08/18 0538 09/09/18 0411  WBC 6.7 7.1  HGB 13.8 14.3  HCT 44.3 44.9  MCV 90.8 88.6  PLT 152 656   Basic Metabolic Panel Recent Labs    09/07/18 0430  NA 138  K 4.3  CL 104  CO2 29  GLUCOSE 128*  BUN 18  CREATININE 1.17  CALCIUM 8.9     Telemetry    Atrial flutter with V rates 40's (personally reviewed)  Radiology    No results found.   Assessment & Plan    1.  Typical atrial flutter with slow ventricular rate Plan TEE/flutter ablation today  After ablation will convert Heparin to Eliquis  2.  Bradycardia If remains bradycardic post ablation, will need CRTP Risks, benefits reviewed with patient who wishes to proceed if necessary  3.  Chronic systolic heart failure/LBBB CRTP as above if remains bradycardic post ablation   For questions or updates, please contact [place] Please consult [email or web address] for contact info under Cardiology/STEMI.  Signed, Chanetta Marshall, NP  09/09/2018, 11:47 AM   EP attending Patient seen and examined.  Agree with the findings as noted above.  Mr. 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  He is undergone TEE which could not be ruled out for a  left atrial appendage thrombus.  Unfortunately his ventricular rates are slow and he will do poorly without back-up pacing.  Because his ejection fraction is 25 to 30%, and because of the concern that it will not improve, it would be most appropriate at this point time despite a lack of medical therapy for his heart failure to proceed with a by V ICD insertion rather than place a biventricular pacemaker have to come back later to upgrade the patient.  Once he has been systemically anticoagulated, we would consider flutter ablation as an outpatient unless he reverts to sinus rhythm.  I discussed all the above with the patient including the risks, goals, benefits, and expectations of  biventricular ICD insertion and the patient is willing to proceed.  Cristopher Peru, MD

## 2018-09-09 NOTE — Anesthesia Procedure Notes (Signed)
Procedure Name: MAC Date/Time: 09/09/2018 10:22 AM Performed by: Lance Coon, CRNA Pre-anesthesia Checklist: Patient identified, Emergency Drugs available, Suction available, Patient being monitored and Timeout performed Patient Re-evaluated:Patient Re-evaluated prior to induction Oxygen Delivery Method: Nasal cannula

## 2018-09-10 ENCOUNTER — Encounter (HOSPITAL_COMMUNITY): Payer: Self-pay | Admitting: Internal Medicine

## 2018-09-10 ENCOUNTER — Telehealth: Payer: Self-pay | Admitting: Cardiology

## 2018-09-10 ENCOUNTER — Inpatient Hospital Stay (HOSPITAL_COMMUNITY): Payer: Medicare HMO

## 2018-09-10 LAB — GLUCOSE, CAPILLARY
Glucose-Capillary: 158 mg/dL — ABNORMAL HIGH (ref 70–99)
Glucose-Capillary: 130 mg/dL — ABNORMAL HIGH (ref 70–99)
Glucose-Capillary: 125 mg/dL — ABNORMAL HIGH (ref 70–99)

## 2018-09-10 LAB — CBC
HCT: 44 % (ref 39.0–52.0)
Hemoglobin: 14.3 g/dL (ref 13.0–17.0)
MCH: 29.2 pg (ref 26.0–34.0)
MCHC: 32.5 g/dL (ref 30.0–36.0)
MCV: 89.8 fL (ref 80.0–100.0)
Platelets: 158 10*3/uL (ref 150–400)
RBC: 4.9 MIL/uL (ref 4.22–5.81)
RDW: 13 % (ref 11.5–15.5)
WBC: 8.2 10*3/uL (ref 4.0–10.5)
nRBC: 0 % (ref 0.0–0.2)

## 2018-09-10 LAB — BASIC METABOLIC PANEL
Anion gap: 9 (ref 5–15)
BUN: 24 mg/dL — ABNORMAL HIGH (ref 8–23)
CO2: 32 mmol/L (ref 22–32)
Calcium: 8.8 mg/dL — ABNORMAL LOW (ref 8.9–10.3)
Chloride: 99 mmol/L (ref 98–111)
Creatinine, Ser: 1.7 mg/dL — ABNORMAL HIGH (ref 0.61–1.24)
GFR calc Af Amer: 44 mL/min — ABNORMAL LOW (ref >60)
GFR calc non Af Amer: 38 mL/min — ABNORMAL LOW (ref >60)
Glucose, Bld: 133 mg/dL — ABNORMAL HIGH (ref 70–99)
Potassium: 4.1 mmol/L (ref 3.5–5.1)
Sodium: 140 mmol/L (ref 135–145)

## 2018-09-10 MED ORDER — CARVEDILOL 3.125 MG PO TABS
3.1250 mg | ORAL_TABLET | Freq: Two times a day (BID) | ORAL | Status: DC
  Administered 2018-09-10: 3.125 mg via ORAL
  Filled 2018-09-10: qty 1

## 2018-09-10 MED ORDER — APIXABAN 5 MG PO TABS: 5.0000 mg | ORAL_TABLET | Freq: Two times a day (BID) | ORAL | 3 refills | Status: AC

## 2018-09-10 MED ORDER — CARVEDILOL 3.125 MG PO TABS: 3.1250 mg | ORAL_TABLET | Freq: Two times a day (BID) | ORAL | 3 refills | Status: AC

## 2018-09-10 NOTE — Discharge Instructions (Signed)
Supplemental Discharge Instructions for  Pacemaker/Defibrillator Patients  Activity No heavy lifting or vigorous activity with your left/right arm for 6 to 8 weeks.  Do not raise your left/right arm above your head for one week.  Gradually raise your affected arm as drawn below.           __       09/14/18                09/15/18                 09/16/18                  09/17/18  NO DRIVING for  1 week   ; you may begin driving on  40/98/11   .  WOUND CARE - Keep the wound area clean and dry.  Do not get this area wet for one week. No showers for one week; you may shower on  09/17/18   . - The tape/steri-strips on your wound will fall off; do not pull them off.  No bandage is needed on the site.  DO  NOT apply any creams, oils, or ointments to the wound area. - If you notice any drainage or discharge from the wound, any swelling or bruising at the site, or you develop a fever > 101? F after you are discharged home, call the office at once.  Special Instructions - You are still able to use cellular telephones; use the ear opposite the side where you have your pacemaker/defibrillator.  Avoid carrying your cellular phone near your device. - When traveling through airports, show security personnel your identification card to avoid being screened in the metal detectors.  Ask the security personnel to use the hand wand. - Avoid arc welding equipment, MRI testing (magnetic resonance imaging), TENS units (transcutaneous nerve stimulators).  Call the office for questions about other devices. - Avoid electrical appliances that are in poor condition or are not properly grounded. - Microwave ovens are safe to be near or to operate.  Additional information for defibrillator patients should your device go off: - If your device goes off ONCE and you feel fine afterward, notify the device clinic nurses. - If your device goes off ONCE and you do not feel well afterward, call 911. - If your device  goes off TWICE, call 911. - If your device goes off THREE times in one day, call 911.  DO NOT DRIVE YOURSELF OR A FAMILY MEMBER WITH A DEFIBRILLATOR TO THE HOSPITAL--CALL 911.     ________________________________________________________________________________  Please start apixaban (Eliquis) 5mg  two times per day on the evening of 09/11/18.      Heart Failure Heart failure means your heart has trouble pumping blood. This makes it hard for your body to work well. Heart failure is usually a long-term (chronic) condition. You must take good care of yourself and follow your doctor's treatment plan. Follow these instructions at home:  Take your heart medicine as told by your doctor. ? Do not stop taking medicine unless your doctor tells you to. ? Do not skip any dose of medicine. ? Refill your medicines before they run out. ? Take other medicines only as told by your doctor or pharmacist.  Stay active if told by your doctor. The elderly and people with severe heart failure should talk with a doctor about physical activity.  Eat heart-healthy foods. Choose foods that are without trans fat and are low in saturated fat,  cholesterol, and salt (sodium). This includes fresh or frozen fruits and vegetables, fish, lean meats, fat-free or low-fat dairy foods, whole grains, and high-fiber foods. Lentils and dried peas and beans (legumes) are also good choices.  Limit salt if told by your doctor.  Cook in a healthy way. Roast, grill, broil, bake, poach, steam, or stir-fry foods.  Limit fluids as told by your doctor.  Weigh yourself every morning. Do this after you pee (urinate) and before you eat breakfast. Write down your weight to give to your doctor.  Take your blood pressure and write it down if your doctor tells you to.  Ask your doctor how to check your pulse. Check your pulse as told.  Lose weight if told by your doctor.  Stop smoking or chewing tobacco. Do not use gum or patches  that help you quit without your doctor's approval.  Schedule and go to doctor visits as told.  Nonpregnant women should have no more than 1 drink a day. Men should have no more than 2 drinks a day. Talk to your doctor about drinking alcohol.  Stop illegal drug use.  Stay current with shots (immunizations).  Manage your health conditions as told by your doctor.  Learn to manage your stress.  Rest when you are tired.  If it is really hot outside: ? Avoid intense activities. ? Use air conditioning or fans, or get in a cooler place. ? Avoid caffeine and alcohol. ? Wear loose-fitting, lightweight, and light-colored clothing.  If it is really cold outside: ? Avoid intense activities. ? Layer your clothing. ? Wear mittens or gloves, a hat, and a scarf when going outside. ? Avoid alcohol.  Learn about heart failure and get support as needed.  Get help to maintain or improve your quality of life and your ability to care for yourself as needed. Contact a doctor if:  You gain weight quickly.  You are more short of breath than usual.  You cannot do your normal activities.  You tire easily.  You cough more than normal, especially with activity.  You have any or more puffiness (swelling) in areas such as your hands, feet, ankles, or belly (abdomen).  You cannot sleep because it is hard to breathe.  You feel like your heart is beating fast (palpitations).  You get dizzy or light-headed when you stand up. Get help right away if:  You have trouble breathing.  There is a change in mental status, such as becoming less alert or not being able to focus.  You have chest pain or discomfort.  You faint. This information is not intended to replace advice given to you by your health care provider. Make sure you discuss any questions you have with your health care provider. Document Released: 07/18/2008 Document Revised: 03/16/2016 Document Reviewed: 11/25/2012 Elsevier Interactive  Patient Education  2017 [place] With Less [employer] with less salt is one way to reduce the amount of sodium you get from food. Depending on your condition and overall health, your health care provider or diet and nutrition specialist (dietitian) may recommend that you reduce your sodium intake. Most people should have less than 2,300 milligrams (mg) of sodium each day. If you have high blood pressure (hypertension), you may need to limit your sodium to 1,500 mg each day. Follow the tips below to help reduce your sodium intake. What do I need to know about cooking with less salt? Shopping  Buy sodium-free or low-sodium products. Look  for the following words on food labels: ? Low-sodium. ? Sodium-free. ? Reduced-sodium. ? No salt added. ? Unsalted.  Buy fresh or frozen vegetables. Avoid canned vegetables.  Avoid buying meats or protein foods that have been injected with broth or saline solution.  Avoid cured or smoked meats, such as hot dogs, bacon, salami, ham, and bologna. Reading food labels  Check the food label before buying or using packaged ingredients.  Look for products with no more than 140 mg of sodium in one serving.  Do not choose foods with salt as one of the first three ingredients on the ingredients list. If salt is one of the first three ingredients, it usually means the item is high in sodium, because ingredients are listed in order of amount in the food item. Cooking  Use herbs, seasonings without salt, and spices as substitutes for salt in foods.  Use sodium-free baking soda when baking.  Grill, braise, or roast foods to add flavor with less salt.  Avoid adding salt to pasta, rice, or hot cereals while cooking.  Drain and rinse canned vegetables before use.  Avoid adding salt when cooking sweets and desserts.  Cook with low-sodium ingredients. What are some salt alternatives? The following are herbs, seasonings, and spices that can  be used instead of salt to give taste to your food. Herbs should be fresh or dried. Do not choose packaged mixes. Next to the name of the herb, spice, or seasoning are some examples of foods you can pair it with. Herbs  Bay leaves - Soups, meat and vegetable dishes, and spaghetti sauce.  Basil - [employer], soups, pasta, and fish dishes.  Cilantro - Meat, poultry, and vegetable dishes.  Chili powder - Marinades and Mexican dishes.  Chives - Salad dressings and potato dishes.  Cumin - Mexican dishes, couscous, and meat dishes.  Dill - Fish dishes, sauces, and salads.  Fennel - Meat and vegetable dishes, breads, and cookies.  Garlic (do not use garlic salt) - [place] dishes, meat dishes, salad dressings, and sauces.  Marjoram - Soups, potato dishes, and meat dishes.  Oregano - Pizza and spaghetti sauce.  Parsley - Salads, soups, pasta, and meat dishes.  Rosemary - [place] dishes, salad dressings, soups, and red meats.  Saffron - Fish dishes, pasta, and some poultry dishes.  Sage - Stuffings and sauces.  Tarragon - Fish and [employer].  Thyme - Stuffing, meat, and fish dishes. Seasonings  Lemon juice - Fish dishes, poultry dishes, vegetables, and salads.  Vinegar - Salad dressings, vegetables, and fish dishes. Spices  Cinnamon - Sweet dishes, such as cakes, cookies, and puddings.  Cloves - Gingerbread, puddings, and marinades for meats.  Curry - Vegetable dishes, fish and poultry dishes, and stir-fry dishes.  Ginger - Vegetables dishes, fish dishes, and stir-fry dishes.  Nutmeg - Pasta, vegetables, poultry, fish dishes, and custard. What are some low-sodium ingredients and foods?  Fresh or frozen fruits and vegetables with no sauce added.  Fresh or frozen whole meats, poultry, and fish with no sauce added.  Eggs.  Noodles, pasta, quinoa, rice.  Shredded or puffed wheat or puffed rice.  Regular or quick oats.  Milk, yogurt, hard cheeses, and  low-sodium cheeses. Good cheese choices include Swiss, [place]. Always check the label for the serving size and sodium content.  Unsalted butter or margarine.  Unsalted nuts.  Sherbet or ice cream (keep to  cup per serving).  Homemade pudding.  Sodium-free baking soda and baking  powder. This is not a complete list of low-sodium ingredients and foods. Contact your dietitian for more options. Summary  Cooking with less salt is one way to reduce the amount of sodium that you get from food.  Buy sodium-free or low-sodium products.  Check the food label before using or buying packaged ingredients.  Use herbs, seasonings without salt, and spices as substitutes for salt in foods. This information is not intended to replace advice given to you by your health care provider. Make sure you discuss any questions you have with your health care provider. Document Released: 10/09/2005 Document Revised: 10/17/2016 Document Reviewed: 10/17/2016 Elsevier Interactive Patient Education  2017 [employer].

## 2018-09-10 NOTE — Care Management Note (Signed)
Case Management Note  Patient Details  Name: Johnathan Wright MRN: [id number] Date of Birth: Jun 13, 1946  Subjective/Objective:  Pt presented for Acute  On chronic CHF-PTA from home with support of caregiver. Patient has life alert button. Per patient he was active with [facility] and wants to resume services.                  Action/Plan: CM did call [facility] to Verify and patient is active- will need resumption orders. CM did reach out to PA for RN/PT orders and wound care orders. Patient has transportation available and the Eliquis 30 day free card provided to patient. No further needs from CM at this time.   Expected Discharge Date:                  Expected Discharge Plan:  [place]  In-House Referral:  NA  Discharge planning Services  CM Consult  Post Acute Care Choice:  Home Health Choice offered to:  Patient  DME Arranged:  N/A DME Agency:  NA  HH Arranged:  RN, Disease Management, PT [place] Agency:  [facility]  Status of Service:  Completed, signed off  If discussed at [place] of Stay Meetings, dates discussed:    Additional Comments:  Bethena Roys, RN 09/10/2018, 12:17 PM

## 2018-09-10 NOTE — Care Management Important Message (Signed)
Important Message  Patient Details  Name: Johnathan Wright MRN: [id number] Date of Birth: 22-Aug-1946   Medicare Important Message Given:  Yes    Orbie Pyo 09/10/2018, 2:47 PM

## 2018-09-10 NOTE — Telephone Encounter (Signed)
Will call later as patient appears to not of been released from the hospital as of yet.

## 2018-09-10 NOTE — Evaluation (Signed)
Physical Therapy Evaluation Patient Details Name: Johnathan Wright MRN: [id number] DOB: June 20, 1946 Today's Date: 09/10/2018   History of Present Illness  Pt is a 72 y.o. male admitted 09/05/18 with bradycardia. Echo revealed new cardiomyopathy with EF 30-35%. Cardiac cath with no CAD. Pt s/p CRT-AICD placement 11/18. 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    Clinical Impression  Pt presents with an overall decrease in functional mobility secondary to above. PTA, pt lives with 24/7 private caregiver; pt ambulatory with RW, requires assist for some mobility and ADLs. Today, pt required modA for mobility; ambulatory with RW and supervision. SpO2 down to 91% on RA. Pt planning for d/c this afternoon with continued caregiver assist. If to remain admitted, will follow acutely to address established goals.    Follow Up Recommendations Home health PT;Supervision for mobility/OOB    Equipment Recommendations  None recommended by PT    Recommendations for Other Services       Precautions / Restrictions Precautions Precautions: Fall;ICD/Pacemaker Precaution Comments: LLE unna boot Restrictions Weight Bearing Restrictions: No      Mobility  Bed Mobility Overal bed mobility: Needs Assistance Bed Mobility: Supine to Sit     Supine to sit: Mod assist     General bed mobility comments: ModA for UE support to assist trunk elevation and scoot hips to EOB; increased time and effort. Pt able to assist with use of bed rail  Transfers Overall transfer level: Needs assistance Equipment used: Rolling walker (2 wheeled) Transfers: Sit to/from Stand Sit to Stand: Min guard;From elevated surface         General transfer comment: Pt inisisting on pulling on RW to use as "counter balance" despite education on safe technique; able to stand with min guard, heavy reliance on UE support on RW. Expect would require increased assist+1 from lower surface  height  Ambulation/Gait Ambulation/Gait assistance: Supervision Gait Distance (Feet): 12 Feet Assistive device: Rolling walker (2 wheeled) Gait Pattern/deviations: Step-to pattern;Trunk flexed Gait velocity: Decreased Gait velocity interpretation: <1.8 ft/sec, indicate of risk for recurrent falls General Gait Details: Slow amb of short distance with RW and supervision for safety; DOE 2/4, SpO2 91% on RA. 1x posterior LOB when pt turning to look behind him, pt able to self-correct with min guard. Educ on turning whole body vs. head turns   Financial trader Rankin (Stroke Patients Only)       Balance Overall balance assessment: Needs assistance   Sitting balance-Leahy Scale: Fair Sitting balance - Comments: Pt required modA to prevent posterior LOB sitting EOB with challenge; poor trunk control/strength     Standing balance-Leahy Scale: Fair Standing balance comment: Can static stand without UE support without challenge                             Pertinent Vitals/Pain Pain Assessment: Faces Faces Pain Scale: Hurts a little bit Pain Location: L shoulder Pain Descriptors / Indicators: Sore Pain Intervention(s): Monitored during session    Home Living Family/patient expects to be discharged to:: Private residence Living Arrangements: (Private 24-hr caregiver) Available Help at Discharge: Personal care attendant;Available 24 hours/day Type of Home: House Home Access: Stairs to enter Entrance Stairs-Rails: None Entrance Stairs-Number of Steps: 2-3 Home Layout: One level Home Equipment: Walker - 2 wheels;Walker - 4 wheels;Cane - single point;Grab bars - tub/shower  Prior Function Level of Independence: Needs assistance   Gait / Transfers Assistance Needed: Ambulatory short distances with RW. Pt reports 3x falls in past 3 months, all when not using RW. Caregiver drives. Caregiver occasionally assists bed mobility and  standing  ADL's / Homemaking Assistance Needed: Pt reports indep with toileting and bathing. Caregiver assist dressing, household tasks and errands        Hand Dominance        Extremity/Trunk Assessment   Upper Extremity Assessment Upper Extremity Assessment: Overall WFL for tasks assessed    Lower Extremity Assessment Lower Extremity Assessment: Generalized weakness;RLE deficits/detail;LLE deficits/detail RLE Deficits / Details: Previous R toe amputation; redness and edema LLE Deficits / Details: LLE in unna boot       Communication   Communication: No difficulties  Cognition Arousal/Alertness: Awake/alert Behavior During Therapy: WFL for tasks assessed/performed Overall Cognitive Status: Within Functional Limits for tasks assessed                                        General Comments General comments (skin integrity, edema, etc.): SpO2 91-96% on RA    Exercises     Assessment/Plan    PT Assessment Patient needs continued PT services  PT Problem List Decreased strength;Decreased activity tolerance;Decreased balance;Decreased mobility       PT Treatment Interventions DME instruction;Gait training;Stair training;Functional mobility training;Therapeutic activities;Therapeutic exercise;Balance training;Patient/family education    PT Goals (Current goals can be found in the Care Plan section)  Acute Rehab PT Goals Patient Stated Goal: Return home with continued assist from caregiver PT Goal Formulation: With patient Time For Goal Achievement: 09/24/18 Potential to Achieve Goals: Good    Frequency Min 3X/week   Barriers to discharge        Co-evaluation               AM-PAC PT "6 Clicks" Daily Activity  Outcome Measure Difficulty turning over in bed (including adjusting bedclothes, sheets and blankets)?: Unable Difficulty moving from lying on back to sitting on the side of the bed? : Unable Difficulty sitting down on and standing  up from a chair with arms (e.g., wheelchair, bedside commode, etc,.)?: Unable Help needed moving to and from a bed to chair (including a wheelchair)?: A Little Help needed walking in hospital room?: A Little Help needed climbing 3-5 steps with a railing? : A Lot 6 Click Score: 11    End of Session Equipment Utilized During Treatment: Gait belt Activity Tolerance: Patient tolerated treatment well Patient left: in chair;with call bell/phone within reach Nurse Communication: Mobility status PT Visit Diagnosis: Other abnormalities of gait and mobility (R26.89);Muscle weakness (generalized) (M62.81)    Time: 9892-1194 PT Time Calculation (min) (ACUTE ONLY): 28 min   Charges:   PT Evaluation $PT Eval Moderate Complexity: 1 Mod PT Treatments $Therapeutic Activity: 8-22 mins       Mabeline Caras, PT, DPT Acute Rehabilitation Services  Pager [phone] Office [phone]  Derry Lory 09/10/2018, 12:03 PM

## 2018-09-10 NOTE — Progress Notes (Signed)
Orthopedic Tech Progress Note Patient Details:  Johnathan Wright [birth year] [id number]  Ortho Devices Type of Ortho Device: Louretta Parma boot Ortho Device/Splint Location: lle Ortho Device/Splint Interventions: Ordered, Application, Adjustment   Post Interventions Patient Tolerated: Well Instructions Provided: Care of device, Adjustment of device   Karolee Stamps 09/10/2018, 10:13 AM

## 2018-09-10 NOTE — Care Management (Signed)
#   1.  S/W ANGELA   [facility]         #  [phone]   1. ELIQUIS   2.5 MG BID   COVER- YES CO-PAY- $ 8.50 TIER- 3 DRUG PRIOR APPROVAL- NO  2. ELIQUIS   5 MG BID COVER- YES CO-PAY- $ 8.50 TIER- 3 DRUG PRIOR APPROVAL- NO  PREFERRED PHARMACY : YES [facility]  [place]

## 2018-09-10 NOTE — Progress Notes (Signed)
Progress Note  Patient Name: Johnathan Wright Date of Encounter: 09/10/2018  Primary Cardiologist:  Has seen Dr. Geraldo Pitter in 2016. Lives in [place] and wants to follow there.   Subjective   Breathing is OK  NO CP   Inpatient Medications    Scheduled Meds: . allopurinol  100 mg Oral Daily  . ARIPiprazole  2 mg Oral Daily  . atorvastatin  10 mg Oral Daily  . furosemide  40 mg Intravenous BID  . insulin aspart  0-15 Units Subcutaneous TID WC  . sodium chloride flush  3 mL Intravenous Q12H  . vortioxetine HBr  20 mg Oral Daily   Continuous Infusions: . sodium chloride    .  ceFAZolin (ANCEF) IV 2 g (09/10/18 0726)   PRN Meds: sodium chloride, acetaminophen, HYDROcodone-acetaminophen, nitroGLYCERIN, ondansetron (ZOFRAN) IV, sodium chloride flush   Vital Signs    Vitals:   09/09/18 1818 09/09/18 1930 09/09/18 2114 09/10/18 0702  BP: 110/67 117/77 118/70 117/66  Pulse: 70  74 60  Resp:   13 13  Temp:   97.7 F (36.5 C) 98 F (36.7 C)  TempSrc:   Oral Oral  SpO2:  (!) 89% 96% 94%  Weight:    (!) 171 kg  Height:        Intake/Output Summary (Last 24 hours) at 09/10/2018 0849 Last data filed at 09/10/2018 0711 Gross per 24 hour  Intake 756.52 ml  Output 1900 ml  Net -1143.48 ml   Filed Weights   09/08/18 0601 09/09/18 0434 09/10/18 0702  Weight: (!) 170.5 kg (!) 167.3 kg (!) 171 kg    Telemetry    AV paced  - Personally Reviewed  ECG      Physical Exam   GEN:  Obese male, no acute distress.   Neck: No JVD Cardiac:  Irregularly irregular, bradycardic, no murmurs, rubs, or gallops.  Chest   Pacer site dry   Respiratory: Clear to auscultation bilaterally anteirorly   GI: Soft, nontender, non-distended  MS:  Trace-1+ lower leg edema; left lower leg is wrapped.  Amputation of toes on right foot  Chronic skin changes   Neuro:  Nonfocal  Psych: Normal affect   Labs    Chemistry Recent Labs  Lab 09/07/18 0430 09/09/18 1254 09/10/18 0349  NA 138  139 140  K 4.3 4.0 4.1  CL 104 101 99  CO2 29 28 32  GLUCOSE 128* 129* 133*  BUN 18 22 24*  CREATININE 1.17 1.34* 1.70*  CALCIUM 8.9 9.0 8.8*  GFRNONAA 60* 51* 38*  GFRAA >60 59* 44*  ANIONGAP 5 10 9      Hematology Recent Labs  Lab 09/08/18 0538 09/09/18 0411 09/10/18 0349  WBC 6.7 7.1 8.2  RBC 4.88 5.07 4.90  HGB 13.8 14.3 14.3  HCT 44.3 44.9 44.0  MCV 90.8 88.6 89.8  MCH 28.3 28.2 29.2  MCHC 31.2 31.8 32.5  RDW 12.8 13.0 13.0  PLT 152 190 158    Cardiac Enzymes Recent Labs  Lab 09/05/18 2242 09/06/18 0424 09/06/18 1034  TROPONINI 0.04* 0.03* 0.03*   No results for input(s): TROPIPOC in the last 168 hours.   BNP Recent Labs  Lab 09/05/18 2242  BNP 87.7     DDimer No results for input(s): DDIMER in the last 168 hours.   Radiology    No results found.  Cardiac Studies   LEFT HEART CATH AND CORONARY ANGIOGRAPHY 09/06/2018  Conclusion    Prox LAD lesion is 20% stenosed.  No significant coronary obstructive disease with mild 20% irregularity in the LAD after the takeoff of the first diagonal vessel; normal ramus intermediate, left circumflex, and RCA.  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  LVEDP 23 mm.  RECOMMENDATION: Guideline directed medical therapy for HFrEF.  The patient should undergo an evaluation for obstructive sleep apnea since in the laboratory he clearly had apneic events with desaturation and had significant intrathoracic pressure changes during his breathing spells.  Hhe will be undergoing BiVICD placement later today  Recommend Aspirin 81mg  daily for moderate CAD.   Cardiac Catheterization 07/29/2015 [facility]): Angiographic Findings: - LMCA: Normal. - LAD: Aneurysmal. - LCx: Normal. - RCA: Focal stenosis.  Lesion on Mid RCA: Mid subsection.25% stenosis 8 mm length .  Diagnostic Procedure Summary: - There is minor CAD. - Moderate global LV dysfunction - EF 35%  Diagnostic Procedure  Recommendations: - Medical therapy for LV dysfunction.  Patient Profile     72 y.o. 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 Echocardiogram revealed new cardiomyopathy with EF 30-35% and he was transferred to [facility] for further cardiac work-up. Cath with no CAD Being considered for CRT-AICD.  Assessment & Plan    Atrial flutter with slow ventricular response -Several years of atrial flutter. Now with rates in the 40's  And dipping into the 30's -Bradycardia   TEE yesterday susp for clot   Ablation defrred    He is now s/p BiV ICD   Converted to SR   Is AV paced Plan to start Eliquis as outpt tomorrow  5 bid   Cardiomyopathy with EF 30-35% -Left heart catheterization 11/19 no obstructive disease. LVEDP 24 mmHg and clinically volume overloaded.  Pt hs diuresed 4.7 L   Cr this am 1.7    WIll stop lasix   Renal function will need to be followed as outpt  On carvedilol   Hold other agents until renal function stabilizes  Follow as outpt  3  CAD   Minimal CAD    Would probably not use ASA if on anticoagulatnt 4  HL   Continue lipitor  5  Unsteadiness  Pt reports signif at times   WIll get PT to assess today prior to d/c    For questions or updates, please contact [facility] Please consult [email or web address] for contact info under    Signed, Dorris Carnes, MD  09/10/2018, 8:49 AM    Patient seen   Agree with plans as noted by Maryla Morrow above    Pt to have BiV ICD today   Dorris Carnes

## 2018-09-10 NOTE — Telephone Encounter (Signed)
New Message   Patient has a TOC appt scheduled 11/26 with Dr. Geraldo Pitter.

## 2018-09-10 NOTE — Progress Notes (Addendum)
Electrophysiology Rounding Note  Patient Name: Johnathan Wright Date of Encounter: 09/10/2018  Primary Cardiologist: Hilty Electrophysiologist: Curt Bears   Subjective   The patient is doing well today.  At this time, the patient denies chest pain, shortness of breath, or any new concerns.  Inpatient Medications    Scheduled Meds: . allopurinol  100 mg Oral Daily  . ARIPiprazole  2 mg Oral Daily  . atorvastatin  10 mg Oral Daily  . furosemide  40 mg Intravenous BID  . insulin aspart  0-15 Units Subcutaneous TID WC  . sodium chloride flush  3 mL Intravenous Q12H  . vortioxetine HBr  20 mg Oral Daily   Continuous Infusions: . sodium chloride    .  ceFAZolin (ANCEF) IV 2 g (09/09/18 2049)   PRN Meds: sodium chloride, acetaminophen, HYDROcodone-acetaminophen, nitroGLYCERIN, ondansetron (ZOFRAN) IV, sodium chloride flush   Vital Signs    Vitals:   09/09/18 1805 09/09/18 1818 09/09/18 1930 09/09/18 2114  BP:  110/67 117/77 118/70  Pulse: 70 70  74  Resp:    13  Temp:    97.7 F (36.5 C)  TempSrc:    Oral  SpO2:   (!) 89% 96%  Weight:      Height:        Intake/Output Summary (Last 24 hours) at 09/10/2018 0650 Last data filed at 09/09/2018 2116 Gross per 24 hour  Intake 756.52 ml  Output 1200 ml  Net -443.48 ml   Filed Weights   09/07/18 0428 09/08/18 0601 09/09/18 0434  Weight: (!) 177.3 kg (!) 170.5 kg (!) 167.3 kg    Physical Exam    GEN- The patient is elderly and obese appearing, alert and oriented x 3 today.   Head- normocephalic, atraumatic Eyes-  Sclera clear, conjunctiva pink Ears- hearing intact Oropharynx- clear Neck- supple Lungs- Clear to ausculation bilaterally, normal work of breathing Heart- Regular rate and rhythm (paced) GI- soft, NT, ND, + BS Extremities- no clubbing, cyanosis, or edema Skin- no rash or lesion Psych- euthymic mood, full affect Neuro- strength and sensation are intact  Labs    CBC Recent Labs    09/09/18 0411  09/10/18 0349  WBC 7.1 8.2  HGB 14.3 14.3  HCT 44.9 44.0  MCV 88.6 89.8  PLT 190 841   Basic Metabolic Panel Recent Labs    09/09/18 1254 09/10/18 0349  NA 139 140  K 4.0 4.1  CL 101 99  CO2 28 32  GLUCOSE 129* 133*  BUN 22 24*  CREATININE 1.34* 1.70*  CALCIUM 9.0 8.8*     Telemetry    AV pacing (personally reviewed)  Radiology    No results found.   Assessment & Plan    1.  Symptomatic bradycardia Doing well s/p CRTD implant CXR with leads in stable position Device interrogation pending Routine wound care and follow up  2.  Atrial flutter Converted to SR with manipulation of RA lead Start Eliquis 5mg  twice daily pm of 09/11/18.    3.  Chronic systolic heart failure Will start BB therapy now that back up pacing in place Also consider adding Entresto - not done today as Creatinine elevated this morning.   EP will sign off.  Routine wound care and follow up. Instructions and appts entered in AVS.   For questions or updates, please contact [place] Please consult [email or web address] for contact info under Cardiology/STEMI.  Signed, Chanetta Marshall, NP  09/10/2018, 6:50 AM   EP Attending  Patient seen and  examined. Agree with above. ICD interogation under my supervision demonstrates that his device is working normally. His CXR looks good. He can be discharged. Ok to start entresto, low dose coreg and Eliquis. Usual followup.  Mikle Bosworth.D.

## 2018-09-10 NOTE — Discharge Summary (Addendum)
Discharge Summary    Patient ID: Johnathan Wright,  MRN: [id number], DOB/AGE: 1946/08/20 72 y.o.  Admit date: 09/05/2018 Discharge date: 09/10/2018  Primary Care Provider: Rochel Wright Primary Cardiologist: Johnathan Lindau, MD  Discharge Diagnoses    Principal Problem:   Acute on chronic combined systolic and diastolic CHF (congestive heart failure) ([place]) Active Problems:   CIDP (chronic inflammatory demyelinating polyneuropathy) (HCC)   LBBB (left bundle branch block)   Morbid obesity (HCC)   Paroxysmal atrial fibrillation (HCC)   Type 2 diabetes mellitus ([place])   Varicose veins of lower extremity   Bradycardia with 31-40 beats per minute   Typical atrial flutter (HCC)   Atrial flutter (HCC)   Allergies Allergies  Allergen Reactions  . Other Cough    Cats    Diagnostic Studies/Procedures    BIV ICD insertion CRT-D 09/09/18: CONCLUSIONS:   1. Nonischemic cardiomyopathy with Left bundle-branch block/CHB and chronic New York Heart Association class III heart failure.   2. Successful biventricular ICD implantation.   3. No early apparent complications.   TEE 09/09/18: Study Conclusions  - Left ventricle: Systolic function was severely reduced. The   estimated ejection fraction was in the range of 25% to 30%.   Severe diffuse hypokinesis. - Aortic valve: Mildly thickened leaflets. No evidence of   vegetation. - Aorta: Ascending aorta diameter: 44 mm (ED). - Ascending aorta: The ascending aorta was moderately dilated. - Mitral valve: No evidence of vegetation. There was trivial   regurgitation. - Left atrium: The atrium was severely dilated. Acoustic contrast   opacification cannot exclude a small thrombus in the appendage.   There was severecontinuous spontaneous echo contrast (&quot;smoke&quot;) in   the cavity. The appendage was poorly visualized and small.   Emptying velocity was mildly reduced. - Atrial septum: There was increased thickness of the  septum,   consistent with lipomatous hypertrophy. - Tricuspid valve: No evidence of vegetation. There was mild   regurgitation. - Pulmonic valve: No evidence of vegetation. There was trivial   regurgitation. _____________   History of Present Illness      72 y.o. male with a past medical history significant for A. 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  Echocardiogram revealed new cardiomyopathy with EF 30 to 35% and he was transferred for further cardiac work-up.  [name] has home health care and his nurse noted that his heart rate was very low.  Then he called his doctor who directed him to the emergency department at [facility].  There he had work-up including EKG which showed question A. fib and old left bundle branch block.  Cardiology was consulted.  Echocardiogram was performed which showed an EF of 30 to 35%, probable septal, inferior and mid to distal lateral wall hypokinesis, dilated atria and moderate pulmonary hypertension.  Labs indicated creatinine of 1 and normal H&H.  He also apparently was taken for nuclear stress testing, however that result is not available.  Currently he is asymptomatic, lying supine in no distress.  On telemetry heart rates in the 40s and what appears to be atrial flutter. Records from [facility] in 2016 show that he had a cardiac catheterization.  At the time he was found to have an EF of 35%.  There was a report of minor coronary artery disease with an aneurysmal LAD and a mild mid  LAD stenosis.  Hospital Course     Consultants: Electrophysiology   1. Chronic non-ischemic cardiomyopathy: Patient presented to [facility] ([facility] with bradycardia. Echo revealed EF 30-35% . Patient underwent a NST which showed a large perfusion defect involving the apex and extending into the inferior  wall. Patient was then transferred to [facility] for further evaluation. He underwent a LHC 09/06/18 which showed minimal CAD with only 20% pLAD stenosis. He was diuresed with IV lasix which was discontinued prior to discharge given bump in Cr to 1.7. Weight at discharge was 171kg. He was started on carvedilol 3.125mg  BID.  - Continue carvedilol  - Consider entresto once Cr improved to baseline - will defer to outpatient - Will need BMET at follow-up visit for close monitoring of Cr.   2. Symptomatic bradycardia: patient was evaluated by electrophysiology and underwent CRTD implantation 09/09/18. CXR post procedure without PTX.  - Wound appointment scheduled for next week - Patient to see Dr. Curt Wright in 1 month  3. Atrial flutter: patient noted to be in atrial flutter with SVR on admission. Evaluated by EP and decision made to pursue ablation prior to PPM placement, however TEE revealed LA thrombus and ablation was aborted. Patient then converted to SR with adjustment of RA lead after PPM placement. Recommended to start eliquis 5mg  BID starting PM of 09/11/18.  - Continue carvedilol - Continue eliquis 5mg  BID for stroke ppx  4. CAD: noted to have minimal CAD on LHC this admission. ASA not imitated due to anticoagulation needs.  - Continue statin  5. HLD: - Continue lipitor  6. Deconditioning and unsteadiness: PT evaluated patient prior to discharge and recommended home PT - Case management assisted with coordinating home needs  7. LE wound: seen by wound care RN this admission and recommended for UNNA boots with plans to change on Monday's/Thursday's - Case management assisted with coordinating home RN needs for ongoing wound management.   8. DM type 2:  - Home medications continued at discharge _____________  Discharge Vitals Blood pressure 107/62, pulse 61, temperature 98.5 F (36.9 C), resp. rate 19, height 6\' 2"  (1.88 m), weight (!) 171 kg, SpO2 94 %.  Filed Weights   09/08/18 0601  09/09/18 0434 09/10/18 0702  Weight: (!) 170.5 kg (!) 167.3 kg (!) 171 kg    Labs & Radiologic Studies    CBC Recent Labs    09/09/18 0411 09/10/18 0349  WBC 7.1 8.2  HGB 14.3 14.3  HCT 44.9 44.0  MCV 88.6 89.8  PLT 190 016   Basic Metabolic Panel Recent Labs    09/09/18 1254 09/10/18 0349  NA 139 140  K 4.0 4.1  CL 101 99  CO2 28 32  GLUCOSE 129* 133*  BUN 22 24*  CREATININE 1.34* 1.70*  CALCIUM 9.0 8.8*   Liver Function Tests No results for input(s): AST, ALT, ALKPHOS, BILITOT, PROT, ALBUMIN in the last 72 hours. No results for input(s): LIPASE, AMYLASE in the last 72 hours. Cardiac Enzymes No results for input(s): CKTOTAL, CKMB, CKMBINDEX, TROPONINI in the last 72 hours. BNP Invalid input(s): POCBNP D-Dimer No results for input(s): DDIMER in the last 72 hours. Hemoglobin A1C No results for input(s): HGBA1C in the last 72 hours. Fasting Lipid Panel No results for input(s): CHOL, HDL, LDLCALC, TRIG, CHOLHDL, LDLDIRECT in the last 72 hours. Thyroid Function Tests No results for input(s): TSH, T4TOTAL, T3FREE, THYROIDAB in the last 72 hours.  Invalid input(s): FREET3 _____________  Dg Chest 2 View  Result Date: 09/10/2018  CLINICAL DATA:  ICD placement EXAM: CHEST - 2 VIEW COMPARISON:  09/04/2018 FINDINGS: LEFT subclavian AICD with leads projecting at RIGHT atrium, RIGHT ventricle and coronary sinus. VP shunt tubing traverses RIGHT hemithorax. Enlargement of cardiac silhouette. Atherosclerotic calcification aorta. Mediastinal contours and pulmonary vascularity otherwise normal. Lungs clear without infiltrate, pleural effusion or pneumothorax. IMPRESSION: LEFT subclavian AICD without acute abnormalities. Electronically Signed   By: Lavonia Dana M.D.   On: 09/10/2018 09:04   Disposition   Patient was seen and examined by Dr. Harrington Challenger who deemed patient as stable for discharge. Follow-up has been arranged. Discharge medications as listed below.   Follow-up Plans &  Appointments    Follow-up Information    [facility] Follow up on 09/18/2018.   Specialty:  Cardiology Why:  at [facility] information: [address] [place]       Constance Haw, MD Follow up on 10/22/2018.   Specialty:  Cardiology Why:  at 9:15AM Contact information: [address] [place] [address] [facility] Follow up.   Specialty:  [place] Why:  Registered Nurse, Physical Therapy Contact information: [address] [address] [phone]        Revankar, Reita Cliche, MD Follow up on 09/17/2018.   Specialty:  Cardiology Why:  Please arrive 15 minutes early for your 10:40am post-hospital cardiology follow-up appointment Contact information: [address] [place] [address] [phone]          Discharge Instructions    Diet - low sodium heart healthy   Complete by:  As directed    Increase activity slowly   Complete by:  As directed       Discharge Medications   Allergies as of 09/10/2018      Reactions   Other Cough   Cats      Medication List    STOP taking these medications   furosemide 40 MG tablet Commonly known as:  LASIX     TAKE these medications   allopurinol 100 MG tablet Commonly known as:  ZYLOPRIM Take 100 mg by mouth daily.   apixaban 5 MG Tabs tablet Commonly known as:  ELIQUIS Take 1 tablet (5 mg total) by mouth 2 (two) times daily. First dose PM of 09/11/18 Start taking on:  09/11/2018   ARIPiprazole 2 MG tablet Commonly known as:  ABILIFY Take 2 mg by mouth daily.   atorvastatin 10 MG tablet Commonly known as:  LIPITOR Take 10 mg by mouth daily.   Bilberry 1000 MG Caps Take by mouth.   carvedilol 3.125 MG tablet Commonly known as:  COREG Take 1 tablet (3.125 mg total) by mouth 2 (two) times daily with a meal.   clotrimazole 1 %  cream Commonly known as:  LOTRIMIN Apply 1 application topically 2 (two) times daily.   desvenlafaxine 100 MG 24 hr tablet Commonly known as:  PRISTIQ Take 100 mg by mouth daily.   fentaNYL 50 MCG/HR Commonly known as:  DURAGESIC - dosed mcg/hr Place 50 mcg onto the skin every 3 (three) days.   L-Arginine 500 MG Caps Take by mouth.   Lutein 6 MG Tabs Take by mouth.   Lutein 40 MG Caps Take by mouth.   LYSINE PO Take 1,000 mg by mouth daily.   Meclizine HCl 25 MG Chew Chew by mouth.   metFORMIN 1000  MG tablet Commonly known as:  GLUCOPHAGE Take 1,000 mg by mouth 2 (two) times daily with a meal.   oxycodone 5 MG capsule Commonly known as:  OXY-IR Take 5 mg by mouth every 4 (four) hours as needed.   TOUJEO SOLOSTAR 300 UNIT/ML Sopn Generic drug:  Insulin Glargine Inject into the skin.   TRINTELLIX 10 MG Tabs tablet Generic drug:  vortioxetine HBr Take 20 mg by mouth daily.   VICTOZA 18 MG/3ML Sopn Generic drug:  liraglutide Inject into the skin.           Outstanding Labs/Studies   Please check a BMET at follow-up appointment to monitor Cr  Duration of Discharge Encounter   Greater than 30 minutes including physician time.  Signed, Abigail Butts PA-C 09/10/2018, 1:46 PM  Pt seen and examined  I  Have amended note above by K Kroeger   Pt converted to SR   S/p BiV ICD  Will start anticoagulation tomorrow.    Outpt follow up as noted.  Dorris Carnes

## 2018-09-11 ENCOUNTER — Encounter (HOSPITAL_COMMUNITY): Payer: Self-pay | Admitting: Cardiology

## 2018-09-11 NOTE — Telephone Encounter (Signed)
Left voicemail for the patient to call the office for TCM call.  

## 2018-09-12 DIAGNOSIS — E1142 Type 2 diabetes mellitus with diabetic polyneuropathy: Secondary | ICD-10-CM | POA: Diagnosis not present

## 2018-09-12 DIAGNOSIS — S81802D Unspecified open wound, left lower leg, subsequent encounter: Secondary | ICD-10-CM | POA: Diagnosis not present

## 2018-09-12 DIAGNOSIS — F319 Bipolar disorder, unspecified: Secondary | ICD-10-CM | POA: Diagnosis not present

## 2018-09-12 DIAGNOSIS — Z982 Presence of cerebrospinal fluid drainage device: Secondary | ICD-10-CM | POA: Diagnosis not present

## 2018-09-12 DIAGNOSIS — I110 Hypertensive heart disease with heart failure: Secondary | ICD-10-CM | POA: Diagnosis not present

## 2018-09-12 DIAGNOSIS — G6181 Chronic inflammatory demyelinating polyneuritis: Secondary | ICD-10-CM | POA: Diagnosis not present

## 2018-09-12 DIAGNOSIS — I480 Paroxysmal atrial fibrillation: Secondary | ICD-10-CM | POA: Diagnosis not present

## 2018-09-12 DIAGNOSIS — G4733 Obstructive sleep apnea (adult) (pediatric): Secondary | ICD-10-CM | POA: Diagnosis not present

## 2018-09-12 DIAGNOSIS — E1121 Type 2 diabetes mellitus with diabetic nephropathy: Secondary | ICD-10-CM | POA: Diagnosis not present

## 2018-09-12 NOTE — Telephone Encounter (Signed)
Another voicemail was left requesting the patient call the office.

## 2018-09-13 NOTE — Telephone Encounter (Signed)
Final voicemail was left for the patient to call the office regarding TCM. Informed patient of appointment date, time and location via voicemail.

## 2018-09-16 ENCOUNTER — Ambulatory Visit (INDEPENDENT_AMBULATORY_CARE_PROVIDER_SITE_OTHER): Payer: Medicare HMO | Admitting: Podiatry

## 2018-09-16 ENCOUNTER — Encounter: Payer: Self-pay | Admitting: Podiatry

## 2018-09-16 VITALS — BP 110/60 | HR 60 | Resp 16

## 2018-09-16 DIAGNOSIS — I872 Venous insufficiency (chronic) (peripheral): Secondary | ICD-10-CM

## 2018-09-16 DIAGNOSIS — L97921 Non-pressure chronic ulcer of unspecified part of left lower leg limited to breakdown of skin: Secondary | ICD-10-CM

## 2018-09-16 DIAGNOSIS — Z950 Presence of cardiac pacemaker: Secondary | ICD-10-CM | POA: Diagnosis not present

## 2018-09-16 DIAGNOSIS — I5043 Acute on chronic combined systolic (congestive) and diastolic (congestive) heart failure: Secondary | ICD-10-CM | POA: Diagnosis not present

## 2018-09-16 DIAGNOSIS — N3946 Mixed incontinence: Secondary | ICD-10-CM | POA: Diagnosis not present

## 2018-09-16 DIAGNOSIS — I480 Paroxysmal atrial fibrillation: Secondary | ICD-10-CM | POA: Diagnosis not present

## 2018-09-16 NOTE — Progress Notes (Signed)
Subjective:  Patient ID: Johnathan Wright, male    DOB: 13-Oct-1946,  MRN: [id number]  Chief Complaint  Patient presents with  . Foot Ulcer    F/U L leg ulcer Pt.states," still looking good, like it's healin and getting smaller." Tx: medihoney, unna boot -pt denies N/v/F/Ch -w/ yellow drainage -Pt was recently Hospitalized for CHF and was applied a pacemaker   72 y.o. male presents for wound care. History as above.  Review of Systems: Negative except as noted in the HPI. Denies N/V/F/Ch.  Past Medical History:  Diagnosis Date  . Atrial fibrillation ([place])   . Cellulitis   . CIDP (chronic inflammatory demyelinating polyneuropathy) ([place])   . Depression   . Diabetes ([place])   . Enlarged heart   . Gout     Current Outpatient Medications:  .  allopurinol (ZYLOPRIM) 100 MG tablet, Take 100 mg by mouth daily., Disp: , Rfl:  .  apixaban (ELIQUIS) 5 MG TABS tablet, Take 1 tablet (5 mg total) by mouth 2 (two) times daily. First dose PM of 09/11/18, Disp: 60 tablet, Rfl: 3 .  ARIPiprazole (ABILIFY) 2 MG tablet, Take 2 mg by mouth daily., Disp: , Rfl:  .  atorvastatin (LIPITOR) 10 MG tablet, Take 10 mg by mouth daily., Disp: , Rfl:  .  Bilberry 1000 MG CAPS, Take by mouth., Disp: , Rfl:  .  carvedilol (COREG) 3.125 MG tablet, Take 1 tablet (3.125 mg total) by mouth 2 (two) times daily with a meal., Disp: 60 tablet, Rfl: 3 .  clotrimazole (LOTRIMIN) 1 % cream, Apply 1 application topically 2 (two) times daily., Disp: , Rfl:  .  desvenlafaxine (PRISTIQ) 100 MG 24 hr tablet, Take 100 mg by mouth daily., Disp: , Rfl:  .  fentaNYL (DURAGESIC - DOSED MCG/HR) 50 MCG/HR, Place 50 mcg onto the skin every 3 (three) days., Disp: , Rfl:  .  Insulin Glargine, 1 Unit Dial, (TOUJEO SOLOSTAR) 300 UNIT/ML SOPN, Inject into the skin. Using 30 units daily, Disp: , Rfl:  .  L-Arginine 500 MG CAPS, Take by mouth., Disp: , Rfl:  .  Liraglutide (VICTOZA) 18 MG/3ML SOPN, Inject into the skin., Disp: , Rfl:  .  Lutein  40 MG CAPS, Take by mouth., Disp: , Rfl:  .  Lutein 6 MG TABS, Take by mouth., Disp: , Rfl:  .  LYSINE PO, Take 1,000 mg by mouth daily., Disp: , Rfl:  .  Meclizine HCl 25 MG CHEW, Chew by mouth., Disp: , Rfl:  .  metFORMIN (GLUCOPHAGE) 1000 MG tablet, Take 1,000 mg by mouth 2 (two) times daily with a meal., Disp: , Rfl:  .  oxycodone (OXY-IR) 5 MG capsule, Take 5 mg by mouth every 4 (four) hours as needed., Disp: , Rfl:  .  vortioxetine HBr (TRINTELLIX) 10 MG TABS, Take 20 mg by mouth daily. , Disp: , Rfl:   Social History   Tobacco Use  Smoking Status Former Smoker  . Last attempt to quit: 10/23/1969  . Years since quitting: 48.9  Smokeless Tobacco Never Used    Allergies  Allergen Reactions  . Other Cough    Cats   Objective:   Vitals:   09/16/18 1339  BP: 110/60  Pulse: 60  Resp: 16   There is no height or weight on file to calculate BMI. Constitutional Well developed. Well nourished.  Vascular Dorsalis pedis pulses palpable bilaterally. Posterior tibial pulses palpable bilaterally. Capillary refill normal to all digits.  No cyanosis or clubbing noted.  Pedal hair growth normal.  Neurologic Normal speech. Oriented to person, place, and time. Protective sensation absent  Dermatologic Nails thickened, dystrophic, elongated.  Wound Location: L leg Wound Base: granular Peri-wound: Intact Exudate: Moderate amount Serosanguinous exudate Wound Measurements: -9/3: 6.5*7 -9/17: 5*6 -9/24: 5*6 -10/1: 5*4 -10/8: 4.5*3 -10/14: 4x3 -10/21: 2.5x3.5 -10/28: 2.5x3.5 -11/7: 2.5x3 11/11: 2x3 -11/25: 1.5x1  Orthopedic: No pain to palpation either foot.   Radiographs: None today Assessment:   1. Chronic ulcer of left leg, limited to breakdown of skin ([place])   2. Venous (peripheral) insufficiency    Plan:  Patient was evaluated and treated and all questions answered.  Ulcer L Leg -Selective debridement with dermal curette  Procedure: Selective Debridement of  Wound Rationale: Removal of devitalized tissue from the wound to promote healing.  Pre-Debridement Wound Measurements: 1.5 cm x 1 cm x 0.2 cm  Post-Debridement Wound Measurements: same as pre-debridement. Type of Debridement: sharp selective Tissue Removed: Devitalized soft-tissue Dressing: Dry, sterile, compression dressing. Disposition: Patient tolerated procedure well. Patient to return in 1 week for follow-up.   Venous Insufficiency L Leg -Reapply multilayer compression wrap.  Procedure: Multilayer Compression dressing Rationale: venous insufficiency Technique: Unna boot, cast padding, Coban compression dressing applied Disposition: Patient tolerated procedure well.    No follow-ups on file.

## 2018-09-17 ENCOUNTER — Encounter: Payer: Self-pay | Admitting: Cardiology

## 2018-09-17 ENCOUNTER — Ambulatory Visit (INDEPENDENT_AMBULATORY_CARE_PROVIDER_SITE_OTHER): Payer: Medicare HMO | Admitting: Cardiology

## 2018-09-17 VITALS — BP 116/68 | HR 60 | Ht 74.0 in | Wt 379.0 lb

## 2018-09-17 DIAGNOSIS — S81802D Unspecified open wound, left lower leg, subsequent encounter: Secondary | ICD-10-CM | POA: Diagnosis not present

## 2018-09-17 DIAGNOSIS — Z982 Presence of cerebrospinal fluid drainage device: Secondary | ICD-10-CM | POA: Diagnosis not present

## 2018-09-17 DIAGNOSIS — G6181 Chronic inflammatory demyelinating polyneuritis: Secondary | ICD-10-CM | POA: Diagnosis not present

## 2018-09-17 DIAGNOSIS — G4733 Obstructive sleep apnea (adult) (pediatric): Secondary | ICD-10-CM | POA: Diagnosis not present

## 2018-09-17 DIAGNOSIS — I480 Paroxysmal atrial fibrillation: Secondary | ICD-10-CM | POA: Diagnosis not present

## 2018-09-17 DIAGNOSIS — I5043 Acute on chronic combined systolic (congestive) and diastolic (congestive) heart failure: Secondary | ICD-10-CM | POA: Diagnosis not present

## 2018-09-17 DIAGNOSIS — E1121 Type 2 diabetes mellitus with diabetic nephropathy: Secondary | ICD-10-CM | POA: Diagnosis not present

## 2018-09-17 DIAGNOSIS — F319 Bipolar disorder, unspecified: Secondary | ICD-10-CM | POA: Diagnosis not present

## 2018-09-17 DIAGNOSIS — I5022 Chronic systolic (congestive) heart failure: Secondary | ICD-10-CM | POA: Diagnosis not present

## 2018-09-17 DIAGNOSIS — I110 Hypertensive heart disease with heart failure: Secondary | ICD-10-CM | POA: Diagnosis not present

## 2018-09-17 DIAGNOSIS — E1142 Type 2 diabetes mellitus with diabetic polyneuropathy: Secondary | ICD-10-CM | POA: Diagnosis not present

## 2018-09-17 NOTE — Patient Instructions (Addendum)
Medication Instructions:  Your physician recommends that you continue on your current medications as directed. Please refer to the Current Medication list given to you today.  If you need a refill on your cardiac medications before your next appointment, please call your pharmacy.   Lab work: None  If you have labs (blood work) drawn today and your tests are completely normal, you will receive your results only by: Marland Kitchen MyChart Message (if you have MyChart) OR . A paper copy in the mail If you have any lab test that is abnormal or we need to change your treatment, we will call you to review the results.  Testing/Procedures: None  Follow-Up: At [facility], you and your health needs are our priority.  As part of our continuing mission to provide you with exceptional heart care, we have created designated Provider Care Teams.  These Care Teams include your primary Cardiologist (physician) and Advanced Practice Providers (APPs -  Physician Assistants and Nurse Practitioners) who all work together to provide you with the care you need, when you need it.  You will need a follow up appointment in 4 months.  Please call our office 2 months in advance to schedule this appointment.  You may see Jenean Lindau, MD or another member of our [facility] Provider Team in [place]: Jenne Campus, MD . Shirlee More, MD  Any Other Special Instructions Will Be Listed Below (If Applicable).

## 2018-09-17 NOTE — Progress Notes (Signed)
Cardiology Office Note:    Date:  09/17/2018   ID:  HUNTLEY KNOOP, DOB 09/21/1946, MRN [id number]  PCP:  Rochel Brome, MD  Cardiologist:  Jenean Lindau, MD   Referring MD: Rochel Brome, MD    ASSESSMENT:    1. Paroxysmal atrial fibrillation (HCC)   2. Acute on chronic combined systolic and diastolic CHF (congestive heart failure) ([place])   3. Chronic systolic heart failure ([place])   4. Morbid obesity ([place])    PLAN:    In order of problems listed above:  1. Primary prevention stressed with the patient.  Importance of compliance with diet and medication stressed and he vocalized understanding.  His blood pressure is stable.  Congestive heart failure education was given.  Diet was given for dyslipidemia and diabetes mellitus and obesity and risks explained and he plans to work on this aggressively to lose his weight. 2. I reviewed hospital records and his anatomically unremarkable coronary arteries.  He has a biventricular defibrillator in the office severely depressed systolic function and this is followed by electrophysiology colleagues 3. He will be seen in follow-up appointment in a month or earlier if he has any concerns.   Medication Adjustments/Labs and Tests Ordered: Current medicines are reviewed at length with the patient today.  Concerns regarding medicines are outlined above.  No orders of the defined types were placed in this encounter.  No orders of the defined types were placed in this encounter.    No chief complaint on file.    History of Present Illness:    Johnathan Wright is a 72 y.o. male.  Patient has been seen by me after long.  Of time.  He went to [facility] with congestive heart failure.  His coronary angiography revealed anatomically normal-appearing coronary arteries with severely depressed ejection fraction and bradycardia.  He underwent biventricular defibrillator.  He denies any problems at this time.  He is morbidly obese and leads a  sedentary lifestyle.  No chest pain orthopnea or PND.  He is breathing fine.  Past Medical History:  Diagnosis Date  . Atrial fibrillation ([place])   . Cellulitis   . CIDP (chronic inflammatory demyelinating polyneuropathy) ([place])   . Depression   . Diabetes ([place])   . Enlarged heart   . Gout     Past Surgical History:  Procedure Laterality Date  . APPENDECTOMY    . BIV ICD INSERTION CRT-D N/A 09/09/2018   Procedure: BIV ICD INSERTION CRT-D;  Surgeon: Evans Lance, MD;  Location: [facility];  Service: Cardiovascular;  Laterality: N/A;  . LEFT HEART CATH AND CORONARY ANGIOGRAPHY N/A 09/06/2018   Procedure: LEFT HEART CATH AND CORONARY ANGIOGRAPHY;  Surgeon: Troy Sine, MD;  Location: [facility];  Service: Cardiovascular;  Laterality: N/A;  . TEE WITHOUT CARDIOVERSION N/A 09/09/2018   Procedure: TRANSESOPHAGEAL ECHOCARDIOGRAM (TEE);  Surgeon: Sueanne Margarita, MD;  Location: [facility] ENDOSCOPY;  Service: Cardiovascular;  Laterality: N/A;  . VOCAL CORD LATERALIZATION, ENDOSCOPIC APPROACH W/ MLB      Current Medications: Current Meds  Medication Sig  . allopurinol (ZYLOPRIM) 100 MG tablet Take 100 mg by mouth daily.  Marland Kitchen apixaban (ELIQUIS) 5 MG TABS tablet Take 1 tablet (5 mg total) by mouth 2 (two) times daily. First dose PM of 09/11/18  . ARIPiprazole (ABILIFY) 2 MG tablet Take 2 mg by mouth daily.  Marland Kitchen atorvastatin (LIPITOR) 10 MG tablet Take 10 mg by mouth daily.  . Bilberry 1000 MG  CAPS Take by mouth.  . carvedilol (COREG) 3.125 MG tablet Take 1 tablet (3.125 mg total) by mouth 2 (two) times daily with a meal.  . clotrimazole (LOTRIMIN) 1 % cream Apply 1 application topically 2 (two) times daily.  . Insulin Glargine, 1 Unit Dial, (TOUJEO SOLOSTAR) 300 UNIT/ML SOPN Inject into the skin. Using 30 units daily  . L-Arginine 500 MG CAPS Take by mouth.  . Lutein 40 MG CAPS Take by mouth.  . Lutein 6 MG TABS Take by mouth.  . LYSINE PO Take 1,000 mg by mouth daily.  Marland Kitchen  vortioxetine HBr (TRINTELLIX) 10 MG TABS Take 20 mg by mouth daily.      Allergies:   Other   Social History   Socioeconomic History  . Marital status: Divorced    Spouse name: Not on file  . Number of children: Not on file  . Years of education: Not on file  . Highest education level: Not on file  Occupational History  . Not on file  Social Needs  . Financial resource strain: Not on file  . Food insecurity:    Worry: Not on file    Inability: Not on file  . Transportation needs:    Medical: Not on file    Non-medical: Not on file  Tobacco Use  . Smoking status: Former Smoker    Last attempt to quit: 10/23/1969    Years since quitting: 48.9  . Smokeless tobacco: Never Used  Substance and Sexual Activity  . Alcohol use: Yes    Alcohol/week: 0.0 standard drinks    Comment: 6 pack a week  . Drug use: No  . Sexual activity: Not on file  Lifestyle  . Physical activity:    Days per week: Not on file    Minutes per session: Not on file  . Stress: Not on file  Relationships  . Social connections:    Talks on phone: Not on file    Gets together: Not on file    Attends religious service: Not on file    Active member of club or organization: Not on file    Attends meetings of clubs or organizations: Not on file    Relationship status: Not on file  Other Topics Concern  . Not on file  Social History Narrative  . Not on file     Family History: The patient's family history includes Dementia in his mother; Heart disease in his mother.  ROS:   Please see the history of present illness.    All other systems reviewed and are negative.  EKGs/Labs/Other Studies Reviewed:    The following studies were reviewed today: I discussed my findings hospital admission details with the patient and questions were answered to his satisfaction.   Recent Labs: 09/05/2018: B Natriuretic Peptide 87.7 09/10/2018: BUN 24; Creatinine, Ser 1.70; Hemoglobin 14.3; Platelets 158; Potassium  4.1; Sodium 140  Recent Lipid Panel No results found for: CHOL, TRIG, HDL, CHOLHDL, VLDL, LDLCALC, LDLDIRECT  Physical Exam:    VS:  BP 116/68 (BP Location: Right Arm, Patient Position: Sitting, Cuff Size: Normal)   Pulse 60   Ht 6\' 2"  (1.88 m)   Wt (!) 379 lb (171.9 kg)   SpO2 98%   BMI 48.66 kg/m     Wt Readings from Last 3 Encounters:  09/17/18 (!) 379 lb (171.9 kg)  09/10/18 (!) 376 lb 15.8 oz (171 kg)     GEN: Patient is in no acute distress HEENT: Normal NECK: No  JVD; No carotid bruits LYMPHATICS: No lymphadenopathy CARDIAC: Hear sounds regular, 2/6 systolic murmur at the apex. RESPIRATORY:  Clear to auscultation without rales, wheezing or rhonchi  ABDOMEN: Soft, non-tender, non-distended MUSCULOSKELETAL:  No edema; No deformity  SKIN: Warm and dry NEUROLOGIC:  Alert and oriented x 3 PSYCHIATRIC:  Normal affect   Signed, Jenean Lindau, MD  09/17/2018 11:30 AM    [facility]

## 2018-09-18 ENCOUNTER — Ambulatory Visit (INDEPENDENT_AMBULATORY_CARE_PROVIDER_SITE_OTHER): Payer: Medicare HMO | Admitting: *Deleted

## 2018-09-18 DIAGNOSIS — I5022 Chronic systolic (congestive) heart failure: Secondary | ICD-10-CM | POA: Diagnosis not present

## 2018-09-18 DIAGNOSIS — I110 Hypertensive heart disease with heart failure: Secondary | ICD-10-CM | POA: Diagnosis not present

## 2018-09-18 DIAGNOSIS — R001 Bradycardia, unspecified: Secondary | ICD-10-CM | POA: Diagnosis not present

## 2018-09-18 DIAGNOSIS — S81802D Unspecified open wound, left lower leg, subsequent encounter: Secondary | ICD-10-CM | POA: Diagnosis not present

## 2018-09-18 DIAGNOSIS — I480 Paroxysmal atrial fibrillation: Secondary | ICD-10-CM | POA: Diagnosis not present

## 2018-09-18 DIAGNOSIS — G6181 Chronic inflammatory demyelinating polyneuritis: Secondary | ICD-10-CM | POA: Diagnosis not present

## 2018-09-18 DIAGNOSIS — G4733 Obstructive sleep apnea (adult) (pediatric): Secondary | ICD-10-CM | POA: Diagnosis not present

## 2018-09-18 DIAGNOSIS — E1142 Type 2 diabetes mellitus with diabetic polyneuropathy: Secondary | ICD-10-CM | POA: Diagnosis not present

## 2018-09-18 DIAGNOSIS — Z982 Presence of cerebrospinal fluid drainage device: Secondary | ICD-10-CM | POA: Diagnosis not present

## 2018-09-18 DIAGNOSIS — F319 Bipolar disorder, unspecified: Secondary | ICD-10-CM | POA: Diagnosis not present

## 2018-09-18 DIAGNOSIS — E1121 Type 2 diabetes mellitus with diabetic nephropathy: Secondary | ICD-10-CM | POA: Diagnosis not present

## 2018-09-18 LAB — CUP PACEART INCLINIC DEVICE CHECK
Date Time Interrogation Session: 20191127050000
HighPow Impedance: 63 Ohm
Implantable Lead Implant Date: 20191118
Implantable Lead Implant Date: 20191118
Implantable Lead Implant Date: 20191118
Implantable Lead Location: 753858
Implantable Lead Location: 753859
Implantable Lead Location: 753860
Implantable Lead Model: 293
Implantable Lead Model: 4674
Implantable Lead Model: 7741
Implantable Lead Serial Number: 1025951
Implantable Lead Serial Number: 442535
Implantable Lead Serial Number: 833002
Implantable Pulse Generator Implant Date: 20191118
Lead Channel Impedance Value: 403 Ohm
Lead Channel Impedance Value: 456 Ohm
Lead Channel Impedance Value: 799 Ohm
Lead Channel Pacing Threshold Amplitude: 0.9 V
Lead Channel Pacing Threshold Amplitude: 0.9 V
Lead Channel Pacing Threshold Amplitude: 1.3 V
Lead Channel Pacing Threshold Pulse Width: 0.4 ms
Lead Channel Pacing Threshold Pulse Width: 0.4 ms
Lead Channel Pacing Threshold Pulse Width: 0.4 ms
Lead Channel Sensing Intrinsic Amplitude: 14 mV
Lead Channel Sensing Intrinsic Amplitude: 16.7 mV
Lead Channel Sensing Intrinsic Amplitude: 8.4 mV
Lead Channel Setting Pacing Amplitude: 3.5 V
Lead Channel Setting Pacing Amplitude: 3.5 V
Lead Channel Setting Pacing Amplitude: 3.5 V
Lead Channel Setting Pacing Pulse Width: 0.4 ms
Lead Channel Setting Pacing Pulse Width: 0.4 ms
Lead Channel Setting Sensing Sensitivity: 0.5 mV
Lead Channel Setting Sensing Sensitivity: 1 mV
Pulse Gen Serial Number: 219271

## 2018-09-18 NOTE — Progress Notes (Signed)
Wound check appointment. Steri-strips removed. Wound without redness or edema. Incision edges approximated, wound well healed. Normal device function. Thresholds, sensing, and impedances consistent with implant measurements. Device programmed at 3.5V for extra safety margin until 3 month visit. Histogram distribution appropriate for patient and level of activity. 3 mode switches-- EGM appears AF+ Eliquis. No ventricular arrhythmias noted. Patient educated about wound care, arm mobility, lifting restrictions, shock plan. ROV with WC 10/28/18 to discuss AF ablation, ROV with [facility] 12/16/18

## 2018-09-20 DIAGNOSIS — S81802D Unspecified open wound, left lower leg, subsequent encounter: Secondary | ICD-10-CM | POA: Diagnosis not present

## 2018-09-20 DIAGNOSIS — F319 Bipolar disorder, unspecified: Secondary | ICD-10-CM | POA: Diagnosis not present

## 2018-09-20 DIAGNOSIS — E1121 Type 2 diabetes mellitus with diabetic nephropathy: Secondary | ICD-10-CM | POA: Diagnosis not present

## 2018-09-20 DIAGNOSIS — I110 Hypertensive heart disease with heart failure: Secondary | ICD-10-CM | POA: Diagnosis not present

## 2018-09-20 DIAGNOSIS — E1142 Type 2 diabetes mellitus with diabetic polyneuropathy: Secondary | ICD-10-CM | POA: Diagnosis not present

## 2018-09-20 DIAGNOSIS — Z982 Presence of cerebrospinal fluid drainage device: Secondary | ICD-10-CM | POA: Diagnosis not present

## 2018-09-20 DIAGNOSIS — I480 Paroxysmal atrial fibrillation: Secondary | ICD-10-CM | POA: Diagnosis not present

## 2018-09-20 DIAGNOSIS — G6181 Chronic inflammatory demyelinating polyneuritis: Secondary | ICD-10-CM | POA: Diagnosis not present

## 2018-09-20 DIAGNOSIS — G4733 Obstructive sleep apnea (adult) (pediatric): Secondary | ICD-10-CM | POA: Diagnosis not present

## 2018-09-24 DIAGNOSIS — I480 Paroxysmal atrial fibrillation: Secondary | ICD-10-CM | POA: Diagnosis not present

## 2018-09-24 DIAGNOSIS — Z982 Presence of cerebrospinal fluid drainage device: Secondary | ICD-10-CM | POA: Diagnosis not present

## 2018-09-24 DIAGNOSIS — G6181 Chronic inflammatory demyelinating polyneuritis: Secondary | ICD-10-CM | POA: Diagnosis not present

## 2018-09-24 DIAGNOSIS — E1121 Type 2 diabetes mellitus with diabetic nephropathy: Secondary | ICD-10-CM | POA: Diagnosis not present

## 2018-09-24 DIAGNOSIS — S81802D Unspecified open wound, left lower leg, subsequent encounter: Secondary | ICD-10-CM | POA: Diagnosis not present

## 2018-09-24 DIAGNOSIS — I110 Hypertensive heart disease with heart failure: Secondary | ICD-10-CM | POA: Diagnosis not present

## 2018-09-24 DIAGNOSIS — G4733 Obstructive sleep apnea (adult) (pediatric): Secondary | ICD-10-CM | POA: Diagnosis not present

## 2018-09-24 DIAGNOSIS — E1142 Type 2 diabetes mellitus with diabetic polyneuropathy: Secondary | ICD-10-CM | POA: Diagnosis not present

## 2018-09-24 DIAGNOSIS — F319 Bipolar disorder, unspecified: Secondary | ICD-10-CM | POA: Diagnosis not present

## 2018-09-27 DIAGNOSIS — I110 Hypertensive heart disease with heart failure: Secondary | ICD-10-CM | POA: Diagnosis not present

## 2018-09-27 DIAGNOSIS — E1121 Type 2 diabetes mellitus with diabetic nephropathy: Secondary | ICD-10-CM | POA: Diagnosis not present

## 2018-09-27 DIAGNOSIS — E1142 Type 2 diabetes mellitus with diabetic polyneuropathy: Secondary | ICD-10-CM | POA: Diagnosis not present

## 2018-09-27 DIAGNOSIS — F319 Bipolar disorder, unspecified: Secondary | ICD-10-CM | POA: Diagnosis not present

## 2018-09-27 DIAGNOSIS — Z982 Presence of cerebrospinal fluid drainage device: Secondary | ICD-10-CM | POA: Diagnosis not present

## 2018-09-27 DIAGNOSIS — G6181 Chronic inflammatory demyelinating polyneuritis: Secondary | ICD-10-CM | POA: Diagnosis not present

## 2018-09-27 DIAGNOSIS — S81802D Unspecified open wound, left lower leg, subsequent encounter: Secondary | ICD-10-CM | POA: Diagnosis not present

## 2018-09-27 DIAGNOSIS — G4733 Obstructive sleep apnea (adult) (pediatric): Secondary | ICD-10-CM | POA: Diagnosis not present

## 2018-09-27 DIAGNOSIS — I480 Paroxysmal atrial fibrillation: Secondary | ICD-10-CM | POA: Diagnosis not present

## 2018-09-30 ENCOUNTER — Encounter: Payer: Medicare HMO | Admitting: Podiatry

## 2018-09-30 DIAGNOSIS — R32 Unspecified urinary incontinence: Secondary | ICD-10-CM | POA: Diagnosis not present

## 2018-10-01 ENCOUNTER — Ambulatory Visit (INDEPENDENT_AMBULATORY_CARE_PROVIDER_SITE_OTHER): Payer: Medicare HMO | Admitting: Podiatry

## 2018-10-01 ENCOUNTER — Encounter: Payer: Self-pay | Admitting: Podiatry

## 2018-10-01 VITALS — BP 134/78 | HR 60 | Temp 96.7°F | Resp 16

## 2018-10-01 DIAGNOSIS — M1A0720 Idiopathic chronic gout, left ankle and foot, without tophus (tophi): Secondary | ICD-10-CM | POA: Diagnosis not present

## 2018-10-01 DIAGNOSIS — L97921 Non-pressure chronic ulcer of unspecified part of left lower leg limited to breakdown of skin: Secondary | ICD-10-CM | POA: Diagnosis not present

## 2018-10-01 DIAGNOSIS — I872 Venous insufficiency (chronic) (peripheral): Secondary | ICD-10-CM | POA: Diagnosis not present

## 2018-10-01 NOTE — Progress Notes (Signed)
Subjective:  Patient ID: Johnathan Wright, male    DOB: 27-Sep-1946,  MRN: [id number]  Chief Complaint  Patient presents with  . Foot Ulcer    F/U L leg ulcer Pt. stated," doing fine, it's looking better." Tx: medihoney and dressing change -pt denies N/V/F/Ch/drainage/swelling -w/ redness -FBS: 134   72 y.o. male presents for wound care. History as above. Complains of swelling and redness of the left great toe and foot  Review of Systems: Negative except as noted in the HPI. Denies N/V/F/Ch.  Past Medical History:  Diagnosis Date  . Atrial fibrillation ([place])   . Cellulitis   . CIDP (chronic inflammatory demyelinating polyneuropathy) ([place])   . Depression   . Diabetes ([place])   . Enlarged heart   . Gout     Current Outpatient Medications:  .  allopurinol (ZYLOPRIM) 100 MG tablet, Take 100 mg by mouth daily., Disp: , Rfl:  .  apixaban (ELIQUIS) 5 MG TABS tablet, Take 1 tablet (5 mg total) by mouth 2 (two) times daily. First dose PM of 09/11/18, Disp: 60 tablet, Rfl: 3 .  ARIPiprazole (ABILIFY) 2 MG tablet, Take 2 mg by mouth daily., Disp: , Rfl:  .  atorvastatin (LIPITOR) 10 MG tablet, Take 10 mg by mouth daily., Disp: , Rfl:  .  Bilberry 1000 MG CAPS, Take by mouth., Disp: , Rfl:  .  carvedilol (COREG) 3.125 MG tablet, Take 1 tablet (3.125 mg total) by mouth 2 (two) times daily with a meal., Disp: 60 tablet, Rfl: 3 .  clotrimazole (LOTRIMIN) 1 % cream, Apply 1 application topically 2 (two) times daily., Disp: , Rfl:  .  Insulin Glargine, 1 Unit Dial, (TOUJEO SOLOSTAR) 300 UNIT/ML SOPN, Inject into the skin. Using 30 units daily, Disp: , Rfl:  .  L-Arginine 500 MG CAPS, Take by mouth., Disp: , Rfl:  .  Lutein 40 MG CAPS, Take by mouth., Disp: , Rfl:  .  Lutein 6 MG TABS, Take by mouth., Disp: , Rfl:  .  LYSINE PO, Take 1,000 mg by mouth daily., Disp: , Rfl:  .  vortioxetine HBr (TRINTELLIX) 10 MG TABS, Take 20 mg by mouth daily. , Disp: , Rfl:   Social History   Tobacco Use    Smoking Status Former Smoker  . Last attempt to quit: 10/23/1969  . Years since quitting: 48.9  Smokeless Tobacco Never Used    Allergies  Allergen Reactions  . Other Cough    Cats   Objective:   Vitals:   10/01/18 1413  BP: 134/78  Pulse: 60  Resp: 16  Temp: (!) 96.7 F (35.9 C)   There is no height or weight on file to calculate BMI. Constitutional Well developed. Well nourished.  Vascular Dorsalis pedis pulses palpable bilaterally. Posterior tibial pulses palpable bilaterally. Capillary refill normal to all digits.  No cyanosis or clubbing noted. Pedal hair growth normal.  Neurologic Normal speech. Oriented to person, place, and time. Protective sensation absent  Dermatologic Nails thickened, dystrophic, elongated.  Wound Location: L leg Wound Base: granular Peri-wound: Intact Exudate: Moderate amount Serosanguinous exudate Wound Measurements: -9/3: 6.5*7 -9/17: 5*6 -9/24: 5*6 -10/1: 5*4 -10/8: 4.5*3 -10/14: 4x3 -10/21: 2.5x3.5 -10/28: 2.5x3.5 -11/7: 2.5x3 11/11: 2x3 -11/25: 1.5x1 -12/10: 1x0.5  Orthopedic: No pain to palpation either foot. Warmth erythema left 1st MPJ   Radiographs: None today Assessment:   1. Chronic ulcer of left leg, limited to breakdown of skin (HCC)   2. Venous (peripheral) insufficiency   3. Chronic gout  of left foot, unspecified cause    Plan:  Patient was evaluated and treated and all questions answered.  Ulcer L Leg -Selective debridement with tissue nipper  Procedure: Selective Debridement of Wound Rationale: Removal of devitalized tissue from the wound to promote healing.  Pre-Debridement Wound Measurements: 1 cm x 0.5 cm x 0.1 cm  Post-Debridement Wound Measurements: same as pre-debridement. Type of Debridement: sharp selective Tissue Removed: Devitalized soft-tissue Dressing: Dry, sterile, compression dressing. Disposition: Patient tolerated procedure well. Patient to return in 1 week for follow-up.     Venous Insufficiency L Leg -Reapply multilayer compression wrap.  Procedure: Multilayer Compression dressing Rationale: venous insufficiency Technique: Unna boot, cast padding, Coban compression dressing applied Disposition: Patient tolerated procedure well.    ?Gout Left Foot -Erythema but no pain.Local warmth -Will continue to monitor -Pt has hx of gout  No follow-ups on file.

## 2018-10-04 DIAGNOSIS — G4733 Obstructive sleep apnea (adult) (pediatric): Secondary | ICD-10-CM | POA: Diagnosis not present

## 2018-10-04 DIAGNOSIS — I480 Paroxysmal atrial fibrillation: Secondary | ICD-10-CM | POA: Diagnosis not present

## 2018-10-04 DIAGNOSIS — F319 Bipolar disorder, unspecified: Secondary | ICD-10-CM | POA: Diagnosis not present

## 2018-10-04 DIAGNOSIS — Z982 Presence of cerebrospinal fluid drainage device: Secondary | ICD-10-CM | POA: Diagnosis not present

## 2018-10-04 DIAGNOSIS — S81802D Unspecified open wound, left lower leg, subsequent encounter: Secondary | ICD-10-CM | POA: Diagnosis not present

## 2018-10-04 DIAGNOSIS — I110 Hypertensive heart disease with heart failure: Secondary | ICD-10-CM | POA: Diagnosis not present

## 2018-10-04 DIAGNOSIS — E1121 Type 2 diabetes mellitus with diabetic nephropathy: Secondary | ICD-10-CM | POA: Diagnosis not present

## 2018-10-04 DIAGNOSIS — E1142 Type 2 diabetes mellitus with diabetic polyneuropathy: Secondary | ICD-10-CM | POA: Diagnosis not present

## 2018-10-04 DIAGNOSIS — G6181 Chronic inflammatory demyelinating polyneuritis: Secondary | ICD-10-CM | POA: Diagnosis not present

## 2018-10-07 ENCOUNTER — Ambulatory Visit (INDEPENDENT_AMBULATORY_CARE_PROVIDER_SITE_OTHER): Payer: Medicare HMO | Admitting: Podiatry

## 2018-10-07 DIAGNOSIS — S90821A Blister (nonthermal), right foot, initial encounter: Secondary | ICD-10-CM | POA: Diagnosis not present

## 2018-10-07 DIAGNOSIS — R600 Localized edema: Secondary | ICD-10-CM | POA: Diagnosis not present

## 2018-10-07 DIAGNOSIS — S81812A Laceration without foreign body, left lower leg, initial encounter: Secondary | ICD-10-CM

## 2018-10-07 DIAGNOSIS — I872 Venous insufficiency (chronic) (peripheral): Secondary | ICD-10-CM

## 2018-10-07 DIAGNOSIS — L97921 Non-pressure chronic ulcer of unspecified part of left lower leg limited to breakdown of skin: Secondary | ICD-10-CM | POA: Diagnosis not present

## 2018-10-07 DIAGNOSIS — W19XXXA Unspecified fall, initial encounter: Secondary | ICD-10-CM | POA: Diagnosis not present

## 2018-10-07 NOTE — Progress Notes (Signed)
Subjective:  Patient ID: Johnathan Wright, male    DOB: 1946/02/23,  MRN: [id number]  No chief complaint on file.  72 y.o. male presents for wound care.  States that the left leg is doing well wound care continues to come out.  Complaint of fall that occurred on Saturday states that the right leg is now very tender he scraped the skin patient's partner Enid Derry has been applying meta honey.  Endorses bleeding from the area.  Also concerned that there is an area of blistering that occurred near where the big toe was removed.  Reports continued swelling to both legs.  Review of Systems: Negative except as noted in the HPI. Denies N/V/F/Ch.  Past Medical History:  Diagnosis Date  . Atrial fibrillation ([place])   . Cellulitis   . CIDP (chronic inflammatory demyelinating polyneuropathy) ([place])   . Depression   . Diabetes ([place])   . Enlarged heart   . Gout     Current Outpatient Medications:  .  allopurinol (ZYLOPRIM) 100 MG tablet, Take 100 mg by mouth daily., Disp: , Rfl:  .  apixaban (ELIQUIS) 5 MG TABS tablet, Take 1 tablet (5 mg total) by mouth 2 (two) times daily. First dose PM of 09/11/18, Disp: 60 tablet, Rfl: 3 .  ARIPiprazole (ABILIFY) 2 MG tablet, Take 2 mg by mouth daily., Disp: , Rfl:  .  atorvastatin (LIPITOR) 10 MG tablet, Take 10 mg by mouth daily., Disp: , Rfl:  .  Bilberry 1000 MG CAPS, Take by mouth., Disp: , Rfl:  .  carvedilol (COREG) 3.125 MG tablet, Take 1 tablet (3.125 mg total) by mouth 2 (two) times daily with a meal., Disp: 60 tablet, Rfl: 3 .  clotrimazole (LOTRIMIN) 1 % cream, Apply 1 application topically 2 (two) times daily., Disp: , Rfl:  .  Insulin Glargine, 1 Unit Dial, (TOUJEO SOLOSTAR) 300 UNIT/ML SOPN, Inject into the skin. Using 30 units daily, Disp: , Rfl:  .  L-Arginine 500 MG CAPS, Take by mouth., Disp: , Rfl:  .  Lutein 40 MG CAPS, Take by mouth., Disp: , Rfl:  .  Lutein 6 MG TABS, Take by mouth., Disp: , Rfl:  .  LYSINE PO, Take 1,000 mg by mouth  daily., Disp: , Rfl:  .  vortioxetine HBr (TRINTELLIX) 10 MG TABS, Take 20 mg by mouth daily. , Disp: , Rfl:   Social History   Tobacco Use  Smoking Status Former Smoker  . Last attempt to quit: 10/23/1969  . Years since quitting: 48.9  Smokeless Tobacco Never Used    Allergies  Allergen Reactions  . Other Cough    Cats   Objective:   There were no vitals filed for this visit. There is no height or weight on file to calculate BMI. Constitutional Well developed. Well nourished.  Vascular Dorsalis pedis pulses palpable bilaterally. Posterior tibial pulses palpable bilaterally. Capillary refill normal to all digits.  No cyanosis or clubbing noted. Pedal hair growth normal.  Neurologic Normal speech. Oriented to person, place, and time. Protective sensation absent  Dermatologic Nails thickened, dystrophic, elongated.  Wound Location: L leg Wound Base: granular Peri-wound: Intact Exudate: Moderate amount Serosanguinous exudate Wound Measurements: -9/3: 6.5*7 -9/17: 5*6 -9/24: 5*6 -10/1: 5*4 -10/8: 4.5*3 -10/14: 4x3 -10/21: 2.5x3.5 -10/28: 2.5x3.5 -11/7: 2.5x3 11/11: 2x3 -11/25: 1.5x1 -12/10: 1x0.5 -12/16: 0.5x0.5  Right leg skin tear measuring 4.5 x 1 superficial Bulla located about the right first metatarsal measuring 2.5 x 0.5 post debridement  Orthopedic: No pain to palpation  either foot. Warmth erythema left 1st MPJ   Radiographs: None today Assessment:   1. Chronic ulcer of left leg, limited to breakdown of skin (HCC)   2. Venous (peripheral) insufficiency   3. Localized edema   4. Blister (nonthermal), right foot, initial encounter   5. Skin tear of left lower leg without complication, initial encounter   6. Fall, initial encounter    Plan:  Patient was evaluated and treated and all questions answered.  Ulcer L Leg -Selective debridement performed of the left leg ulceration with dermal curette  Procedure: Selective Debridement of  Wound Rationale: Removal of devitalized tissue from the wound to promote healing.  Pre-Debridement Wound Measurements: 0.5 cm x 0.5 cm x 0.1 cm  Post-Debridement Wound Measurements: same as pre-debridement. Type of Debridement: sharp selective Tissue Removed: Devitalized soft-tissue Dressing: Dry, sterile, compression dressing. Disposition: Patient tolerated procedure well. Patient to return in 1 week for follow-up.   Venous Insufficiency bilateral -Multilayer compression dressing is applied bilateral  Procedure: Multilayer Compression dressing Rationale: venous insufficiency Technique: Unna boot, cast padding, Coban compression dressing applied Disposition: Patient tolerated procedure well.    Skin tear right leg -Should heal uneventfully  Blister right first metatarsal area -Incised with 312 blade tissue thoroughly debrided with a tissue nipper  Procedure: Lance bulla Anesthesia: None Instrumentation: 312 Technique: Following skin prep a 312-lead was used to incise the bulla with return of serous drainage the area was thoroughly debrided with a tissue nipper   No follow-ups on file.

## 2018-10-09 DIAGNOSIS — I480 Paroxysmal atrial fibrillation: Secondary | ICD-10-CM | POA: Diagnosis not present

## 2018-10-09 DIAGNOSIS — S81802D Unspecified open wound, left lower leg, subsequent encounter: Secondary | ICD-10-CM | POA: Diagnosis not present

## 2018-10-09 DIAGNOSIS — F319 Bipolar disorder, unspecified: Secondary | ICD-10-CM | POA: Diagnosis not present

## 2018-10-09 DIAGNOSIS — I110 Hypertensive heart disease with heart failure: Secondary | ICD-10-CM | POA: Diagnosis not present

## 2018-10-09 DIAGNOSIS — G6181 Chronic inflammatory demyelinating polyneuritis: Secondary | ICD-10-CM | POA: Diagnosis not present

## 2018-10-09 DIAGNOSIS — G4733 Obstructive sleep apnea (adult) (pediatric): Secondary | ICD-10-CM | POA: Diagnosis not present

## 2018-10-09 DIAGNOSIS — E1121 Type 2 diabetes mellitus with diabetic nephropathy: Secondary | ICD-10-CM | POA: Diagnosis not present

## 2018-10-09 DIAGNOSIS — E1142 Type 2 diabetes mellitus with diabetic polyneuropathy: Secondary | ICD-10-CM | POA: Diagnosis not present

## 2018-10-09 DIAGNOSIS — Z982 Presence of cerebrospinal fluid drainage device: Secondary | ICD-10-CM | POA: Diagnosis not present

## 2018-10-11 DIAGNOSIS — I480 Paroxysmal atrial fibrillation: Secondary | ICD-10-CM | POA: Diagnosis not present

## 2018-10-11 DIAGNOSIS — G4733 Obstructive sleep apnea (adult) (pediatric): Secondary | ICD-10-CM | POA: Diagnosis not present

## 2018-10-11 DIAGNOSIS — F319 Bipolar disorder, unspecified: Secondary | ICD-10-CM | POA: Diagnosis not present

## 2018-10-11 DIAGNOSIS — E1121 Type 2 diabetes mellitus with diabetic nephropathy: Secondary | ICD-10-CM | POA: Diagnosis not present

## 2018-10-11 DIAGNOSIS — E1142 Type 2 diabetes mellitus with diabetic polyneuropathy: Secondary | ICD-10-CM | POA: Diagnosis not present

## 2018-10-11 DIAGNOSIS — S81802D Unspecified open wound, left lower leg, subsequent encounter: Secondary | ICD-10-CM | POA: Diagnosis not present

## 2018-10-11 DIAGNOSIS — I110 Hypertensive heart disease with heart failure: Secondary | ICD-10-CM | POA: Diagnosis not present

## 2018-10-11 DIAGNOSIS — Z982 Presence of cerebrospinal fluid drainage device: Secondary | ICD-10-CM | POA: Diagnosis not present

## 2018-10-11 DIAGNOSIS — G6181 Chronic inflammatory demyelinating polyneuritis: Secondary | ICD-10-CM | POA: Diagnosis not present

## 2018-10-15 DIAGNOSIS — Z982 Presence of cerebrospinal fluid drainage device: Secondary | ICD-10-CM | POA: Diagnosis not present

## 2018-10-15 DIAGNOSIS — F319 Bipolar disorder, unspecified: Secondary | ICD-10-CM | POA: Diagnosis not present

## 2018-10-15 DIAGNOSIS — I480 Paroxysmal atrial fibrillation: Secondary | ICD-10-CM | POA: Diagnosis not present

## 2018-10-15 DIAGNOSIS — E1121 Type 2 diabetes mellitus with diabetic nephropathy: Secondary | ICD-10-CM | POA: Diagnosis not present

## 2018-10-15 DIAGNOSIS — S81802D Unspecified open wound, left lower leg, subsequent encounter: Secondary | ICD-10-CM | POA: Diagnosis not present

## 2018-10-15 DIAGNOSIS — G6181 Chronic inflammatory demyelinating polyneuritis: Secondary | ICD-10-CM | POA: Diagnosis not present

## 2018-10-15 DIAGNOSIS — I110 Hypertensive heart disease with heart failure: Secondary | ICD-10-CM | POA: Diagnosis not present

## 2018-10-15 DIAGNOSIS — G4733 Obstructive sleep apnea (adult) (pediatric): Secondary | ICD-10-CM | POA: Diagnosis not present

## 2018-10-15 DIAGNOSIS — E1142 Type 2 diabetes mellitus with diabetic polyneuropathy: Secondary | ICD-10-CM | POA: Diagnosis not present

## 2018-10-18 ENCOUNTER — Encounter: Payer: Medicare HMO | Admitting: Podiatry

## 2018-10-18 DIAGNOSIS — S81802D Unspecified open wound, left lower leg, subsequent encounter: Secondary | ICD-10-CM | POA: Diagnosis not present

## 2018-10-18 DIAGNOSIS — G6181 Chronic inflammatory demyelinating polyneuritis: Secondary | ICD-10-CM | POA: Diagnosis not present

## 2018-10-18 DIAGNOSIS — E1121 Type 2 diabetes mellitus with diabetic nephropathy: Secondary | ICD-10-CM | POA: Diagnosis not present

## 2018-10-18 DIAGNOSIS — G4733 Obstructive sleep apnea (adult) (pediatric): Secondary | ICD-10-CM | POA: Diagnosis not present

## 2018-10-18 DIAGNOSIS — I110 Hypertensive heart disease with heart failure: Secondary | ICD-10-CM | POA: Diagnosis not present

## 2018-10-18 DIAGNOSIS — Z982 Presence of cerebrospinal fluid drainage device: Secondary | ICD-10-CM | POA: Diagnosis not present

## 2018-10-18 DIAGNOSIS — F319 Bipolar disorder, unspecified: Secondary | ICD-10-CM | POA: Diagnosis not present

## 2018-10-18 DIAGNOSIS — I480 Paroxysmal atrial fibrillation: Secondary | ICD-10-CM | POA: Diagnosis not present

## 2018-10-18 DIAGNOSIS — E1142 Type 2 diabetes mellitus with diabetic polyneuropathy: Secondary | ICD-10-CM | POA: Diagnosis not present

## 2018-10-21 ENCOUNTER — Ambulatory Visit (INDEPENDENT_AMBULATORY_CARE_PROVIDER_SITE_OTHER): Payer: Medicare HMO | Admitting: Podiatry

## 2018-10-21 ENCOUNTER — Telehealth: Payer: Self-pay | Admitting: *Deleted

## 2018-10-21 DIAGNOSIS — L928 Other granulomatous disorders of the skin and subcutaneous tissue: Secondary | ICD-10-CM | POA: Diagnosis not present

## 2018-10-21 DIAGNOSIS — L97929 Non-pressure chronic ulcer of unspecified part of left lower leg with unspecified severity: Secondary | ICD-10-CM | POA: Diagnosis not present

## 2018-10-21 DIAGNOSIS — L97511 Non-pressure chronic ulcer of other part of right foot limited to breakdown of skin: Secondary | ICD-10-CM

## 2018-10-21 NOTE — Progress Notes (Signed)
Subjective:  Patient ID: Johnathan Wright, male    DOB: 1946-05-17,  MRN: [id number]  Chief Complaint  Patient presents with  . Routine Post Op    dos 09.09.2019 Debridement and Irrigation of Lt leg wound  everything is looking good    72 y.o. male presents for wound care.  States that the left leg is doing well thinks the wound is healed.  Wound on the right foot is doing well without any issues.  Review of Systems: Negative except as noted in the HPI. Denies N/V/F/Ch.  Past Medical History:  Diagnosis Date  . Atrial fibrillation ([place])   . Cellulitis   . CIDP (chronic inflammatory demyelinating polyneuropathy) ([place])   . Depression   . Diabetes ([place])   . Enlarged heart   . Gout     Current Outpatient Medications:  .  allopurinol (ZYLOPRIM) 100 MG tablet, Take 100 mg by mouth daily., Disp: , Rfl:  .  apixaban (ELIQUIS) 5 MG TABS tablet, Take 1 tablet (5 mg total) by mouth 2 (two) times daily. First dose PM of 09/11/18, Disp: 60 tablet, Rfl: 3 .  ARIPiprazole (ABILIFY) 2 MG tablet, Take 2 mg by mouth daily., Disp: , Rfl:  .  atorvastatin (LIPITOR) 10 MG tablet, Take 10 mg by mouth daily., Disp: , Rfl:  .  Bilberry 1000 MG CAPS, Take by mouth., Disp: , Rfl:  .  carvedilol (COREG) 3.125 MG tablet, Take 1 tablet (3.125 mg total) by mouth 2 (two) times daily with a meal., Disp: 60 tablet, Rfl: 3 .  clotrimazole (LOTRIMIN) 1 % cream, Apply 1 application topically 2 (two) times daily., Disp: , Rfl:  .  Insulin Glargine, 1 Unit Dial, (TOUJEO SOLOSTAR) 300 UNIT/ML SOPN, Inject into the skin. Using 30 units daily, Disp: , Rfl:  .  L-Arginine 500 MG CAPS, Take by mouth., Disp: , Rfl:  .  Lutein 40 MG CAPS, Take by mouth., Disp: , Rfl:  .  Lutein 6 MG TABS, Take by mouth., Disp: , Rfl:  .  LYSINE PO, Take 1,000 mg by mouth daily., Disp: , Rfl:  .  vortioxetine HBr (TRINTELLIX) 10 MG TABS, Take 20 mg by mouth daily. , Disp: , Rfl:   Social History   Tobacco Use  Smoking Status Former  Smoker  . Last attempt to quit: 10/23/1969  . Years since quitting: 49.0  Smokeless Tobacco Never Used    Allergies  Allergen Reactions  . Other Cough    Cats   Objective:   There were no vitals filed for this visit. There is no height or weight on file to calculate BMI. Constitutional Well developed. Well nourished.  Vascular Dorsalis pedis pulses palpable bilaterally. Posterior tibial pulses palpable bilaterally. Capillary refill normal to all digits.  No cyanosis or clubbing noted. Pedal hair growth normal.  Neurologic Normal speech. Oriented to person, place, and time. Protective sensation absent  Dermatologic Nails thickened, dystrophic, elongated.  Wound Location: L leg Wound Base: granular Peri-wound: Intact Exudate: Moderate amount Serosanguinous exudate Wound Measurements: -9/3: 6.5*7 -9/17: 5*6 -9/24: 5*6 -10/1: 5*4 -10/8: 4.5*3 -10/14: 4x3 -10/21: 2.5x3.5 -10/28: 2.5x3.5 -11/7: 2.5x3 11/11: 2x3 -11/25: 1.5x1 -12/10: 1x0.5 -12/16: 0.5x0.5 -12/30: Healed  Right leg skin tear measuring 4.5 x 1 superficial Right first metatarsal ulcer measuring 0.5 x 1 with granular base  Orthopedic: No pain to palpation either foot. Warmth erythema left 1st MPJ   Radiographs: None today Assessment:   1. Chronic ulcer of lower extremity, left, with unspecified severity (  [place])   2. Ulcer of toe, right, limited to breakdown of skin ([place])   3. 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  Ulcer L Leg -Healed no open ulceration noted today.   Ulcer right first metatarsal area -Granulation tissue cauterized with silver nitrate to promote epithelialization  No follow-ups on file.

## 2018-10-21 NOTE — Telephone Encounter (Addendum)
Dr. March Rummage states pt's wounds are healed D/C [facility]. Faxed orders to [facility].

## 2018-10-22 ENCOUNTER — Encounter: Payer: Medicare HMO | Admitting: Cardiology

## 2018-10-22 DIAGNOSIS — Z982 Presence of cerebrospinal fluid drainage device: Secondary | ICD-10-CM | POA: Diagnosis not present

## 2018-10-22 DIAGNOSIS — G4733 Obstructive sleep apnea (adult) (pediatric): Secondary | ICD-10-CM | POA: Diagnosis not present

## 2018-10-22 DIAGNOSIS — E1121 Type 2 diabetes mellitus with diabetic nephropathy: Secondary | ICD-10-CM | POA: Diagnosis not present

## 2018-10-22 DIAGNOSIS — G6181 Chronic inflammatory demyelinating polyneuritis: Secondary | ICD-10-CM | POA: Diagnosis not present

## 2018-10-22 DIAGNOSIS — F319 Bipolar disorder, unspecified: Secondary | ICD-10-CM | POA: Diagnosis not present

## 2018-10-22 DIAGNOSIS — E1142 Type 2 diabetes mellitus with diabetic polyneuropathy: Secondary | ICD-10-CM | POA: Diagnosis not present

## 2018-10-22 DIAGNOSIS — I480 Paroxysmal atrial fibrillation: Secondary | ICD-10-CM | POA: Diagnosis not present

## 2018-10-22 DIAGNOSIS — I110 Hypertensive heart disease with heart failure: Secondary | ICD-10-CM | POA: Diagnosis not present

## 2018-10-22 DIAGNOSIS — S81802D Unspecified open wound, left lower leg, subsequent encounter: Secondary | ICD-10-CM | POA: Diagnosis not present

## 2018-10-24 DIAGNOSIS — E1121 Type 2 diabetes mellitus with diabetic nephropathy: Secondary | ICD-10-CM | POA: Diagnosis not present

## 2018-10-24 DIAGNOSIS — E1142 Type 2 diabetes mellitus with diabetic polyneuropathy: Secondary | ICD-10-CM | POA: Diagnosis not present

## 2018-10-24 DIAGNOSIS — M7061 Trochanteric bursitis, right hip: Secondary | ICD-10-CM | POA: Diagnosis not present

## 2018-10-24 DIAGNOSIS — E782 Mixed hyperlipidemia: Secondary | ICD-10-CM | POA: Diagnosis not present

## 2018-10-28 ENCOUNTER — Encounter: Payer: Self-pay | Admitting: Cardiology

## 2018-10-28 ENCOUNTER — Ambulatory Visit (INDEPENDENT_AMBULATORY_CARE_PROVIDER_SITE_OTHER): Payer: Medicare HMO | Admitting: Cardiology

## 2018-10-28 VITALS — BP 124/64 | HR 60 | Ht 74.0 in | Wt 378.0 lb

## 2018-10-28 DIAGNOSIS — I447 Left bundle-branch block, unspecified: Secondary | ICD-10-CM

## 2018-10-28 DIAGNOSIS — I2510 Atherosclerotic heart disease of native coronary artery without angina pectoris: Secondary | ICD-10-CM

## 2018-10-28 DIAGNOSIS — I5022 Chronic systolic (congestive) heart failure: Secondary | ICD-10-CM | POA: Diagnosis not present

## 2018-10-28 DIAGNOSIS — I483 Typical atrial flutter: Secondary | ICD-10-CM

## 2018-10-28 NOTE — Patient Instructions (Addendum)
Medication Instructions:  Your physician recommends that you continue on your current medications as directed. Please refer to the Current Medication list given to you today.  *If you need a refill on your cardiac medications before your next appointment, please call your pharmacy*  Labwork: None ordered We will contact your primary doctor to obtain your recent lab work  Testing/Procedures: None ordered  Follow-Up: You have been referred to cardiac rehab.  Someone will contact you to arrange this.  Due to a schedule change, please reschedule your 12/16/18 office visit with Dr. Curt Bears.  (we will not be in the [place] office on 2/24)  Thank you for choosing [facility]!!   Trinidad Curet, RN [phone]  Any Other Special Instructions Will Be Listed Below (If Applicable). The nurse will contact you to obtain a list of your current medications.

## 2018-10-28 NOTE — Progress Notes (Signed)
Electrophysiology Office Note   Date:  10/28/2018   ID:  Johnathan Wright, Johnathan Wright [birth year], MRN [id number]  PCP:  Rochel Brome, MD  Cardiologist:  Debara Pickett Primary Electrophysiologist:  Skanda Worlds Meredith Leeds, MD    No chief complaint on file.    History of Present Illness: Johnathan Wright is a 73 y.o. male who is being seen today for the evaluation of atrial flutter at the request of Cox, Elnita Maxwell, MD. Presenting today for electrophysiology evaluation.  He has a history of diabetes, cellulitis with nonhealing wound on the right great toe status post amputation, gout, morbid obesity, chronic inflammatory demyelinating neuropathy, and left bundle branch block.  He had a [employer] CRT-D implanted 09/09/2018 after presentation to the hospital with symptomatic bradycardia.  He has a chronic nonhealing ulcer and had a 2 amputation.  He is doing much better from that.  He would like to start exercising.  He has not had chest pain or shortness of breath.  He was unaware that he was in atrial flutter previously.    Today, he denies symptoms of palpitations, chest pain, shortness of breath, orthopnea, PND, lower extremity edema, claudication, dizziness, presyncope, syncope, bleeding, or neurologic sequela. The patient is tolerating medications without difficulties.    Past Medical History:  Diagnosis Date  . Atrial fibrillation ([place])   . Cellulitis   . CIDP (chronic inflammatory demyelinating polyneuropathy) ([place])   . Depression   . Diabetes ([place])   . Enlarged heart   . Gout    Past Surgical History:  Procedure Laterality Date  . APPENDECTOMY    . BIV ICD INSERTION CRT-D N/A 09/09/2018   Procedure: BIV ICD INSERTION CRT-D;  Surgeon: Evans Lance, MD;  Location: [place] [facility];  Service: Cardiovascular;  Laterality: N/A;  . LEFT HEART CATH AND CORONARY ANGIOGRAPHY N/A 09/06/2018   Procedure: LEFT HEART CATH AND CORONARY ANGIOGRAPHY;  Surgeon: Troy Sine, MD;  Location: [place] [facility];  Service: Cardiovascular;  Laterality: N/A;  . TEE WITHOUT CARDIOVERSION N/A 09/09/2018   Procedure: TRANSESOPHAGEAL ECHOCARDIOGRAM (TEE);  Surgeon: Sueanne Margarita, MD;  Location: [facility] ENDOSCOPY;  Service: Cardiovascular;  Laterality: N/A;  . VOCAL CORD LATERALIZATION, ENDOSCOPIC APPROACH W/ MLB       Current Outpatient Medications  Medication Sig Dispense Refill  . allopurinol (ZYLOPRIM) 100 MG tablet Take 100 mg by mouth daily.    Marland Kitchen apixaban (ELIQUIS) 5 MG TABS tablet Take 1 tablet (5 mg total) by mouth 2 (two) times daily. First dose PM of 09/11/18 60 tablet 3  . ARIPiprazole (ABILIFY) 2 MG tablet Take 2 mg by mouth daily.    Marland Kitchen atorvastatin (LIPITOR) 10 MG tablet Take 10 mg by mouth daily.    . Bilberry 1000 MG CAPS Take by mouth.    . carvedilol (COREG) 3.125 MG tablet Take 1 tablet (3.125 mg total) by mouth 2 (two) times daily with a meal. 60 tablet 3  . clotrimazole (LOTRIMIN) 1 % cream Apply 1 application topically 2 (two) times daily.    . Insulin Glargine, 1 Unit Dial, (TOUJEO SOLOSTAR) 300 UNIT/ML SOPN Inject into the skin. Using 30 units daily    . L-Arginine 500 MG CAPS Take by mouth.    . LUTEIN PO Take 66 mg by mouth daily.    Marland Kitchen LYSINE PO Take 1,000 mg by mouth daily.    Marland Kitchen PREDNISONE, PAK, PO Take by mouth as directed.    . vortioxetine HBr (TRINTELLIX) 10 MG TABS  Take 20 mg by mouth daily.      No current facility-administered medications for this visit.     Allergies:   Other   Social History:  The patient  reports that he quit smoking about 49 years ago. He has never used smokeless tobacco. He reports current alcohol use. He reports that he does not use drugs.   Family History:  The patient's family history includes Dementia in his mother; Heart disease in his mother.    ROS:  Please see the history of present illness.   Otherwise, review of systems is positive for none.   All other systems are reviewed and negative.    PHYSICAL EXAM: VS:  BP  124/64   Pulse 60   Ht 6\' 2"  (1.88 m)   Wt (!) 378 lb (171.5 kg)   BMI 48.53 kg/m  , BMI Body mass index is 48.53 kg/m. GEN: Well nourished, well developed, in no acute distress  HEENT: normal  Neck: no JVD, carotid bruits, or masses Cardiac: RRR; no murmurs, rubs, or gallops,no edema  Respiratory:  clear to auscultation bilaterally, normal work of breathing GI: soft, nontender, nondistended, + BS MS: no deformity or atrophy  Skin: warm and dry, device pocket is well healed Neuro:  Strength and sensation are intact Psych: euthymic mood, full affect  EKG:  EKG is ordered today. Personal review of the ekg ordered shows AV paced  Device interrogation is reviewed today in detail.  See PaceArt for details.   Recent Labs: 09/05/2018: B Natriuretic Peptide 87.7 09/10/2018: BUN 24; Creatinine, Ser 1.70; Hemoglobin 14.3; Platelets 158; Potassium 4.1; Sodium 140    Lipid Panel  No results found for: CHOL, TRIG, HDL, CHOLHDL, VLDL, LDLCALC, LDLDIRECT   Wt Readings from Last 3 Encounters:  10/28/18 (!) 378 lb (171.5 kg)  09/17/18 (!) 379 lb (171.9 kg)  09/10/18 (!) 376 lb 15.8 oz (171 kg)      Other studies Reviewed: Additional studies/ records that were reviewed today include: TTE 09/09/18  Review of the above records today demonstrates:  - Left ventricle: Systolic function was severely reduced. The   estimated ejection fraction was in the range of 25% to 30%.   Severe diffuse hypokinesis. - Aortic valve: Mildly thickened leaflets. No evidence of   vegetation. - Aorta: Ascending aorta diameter: 44 mm (ED). - Ascending aorta: The ascending aorta was moderately dilated. - Mitral valve: No evidence of vegetation. There was trivial   regurgitation. - Left atrium: The atrium was severely dilated. Acoustic contrast   opacification cannot exclude a small thrombus in the appendage.   There was severecontinuous spontaneous echo contrast (&quot;smoke&quot;) in   the cavity. The  appendage was poorly visualized and small.   Emptying velocity was mildly reduced. - Atrial septum: There was increased thickness of the septum,   consistent with lipomatous hypertrophy. - Tricuspid valve: No evidence of vegetation. There was mild   regurgitation. - Pulmonic valve: No evidence of vegetation. There was trivial   regurgitation.   ASSESSMENT AND PLAN:  1.  Chronic nonischemic cardiomyopathy: Status post [employer] CRT-D.  Currently on Coreg but ACE inhibitor/ARB has been held due to kidney issues.  We Rasa Degrazia recheck a basic metabolic today and potentially add Entresto.  We Sapphira Harjo refer him to cardiac rehab.  2.  Symptomatic bradycardia: Status post [employer] CRT-D implanted 09/09/2018.  Device functioning appropriately.  No changes.  3.  Typical atrial flutter: Found on admission to the hospital.  Initially it was  thought that he would require ablation.  He has had minimal atrial flutter since being discharged from the hospital.  We Rishit Burkhalter continue his anticoagulation and hold off on ablation at this time unless he has further episodes of tachycardia.  4.  Coronary artery disease: Nonobstructive based on catheterization.  No current chest pain.  Current medicines are reviewed at length with the patient today.   The patient does not have concerns regarding his medicines.  The following changes were made today:  none  Labs/ tests ordered today include:  Orders Placed This Encounter  Procedures  . EKG 12-Lead   Case discussed with primary cardiologist  Disposition:   FU with Farin Buhman 2 months  Signed, Durelle Zepeda Meredith Leeds, MD  10/28/2018 9:15 AM     [facility] [address] [place] [address] [phone] (office) [phone] (fax)

## 2018-10-30 NOTE — Progress Notes (Signed)
Left message for patient to return call.

## 2018-11-05 NOTE — Progress Notes (Signed)
Left second message for patient to return call.

## 2018-11-11 NOTE — Progress Notes (Signed)
Patient scheduled and aware

## 2018-11-17 DIAGNOSIS — R0902 Hypoxemia: Secondary | ICD-10-CM | POA: Diagnosis not present

## 2018-11-17 DIAGNOSIS — R069 Unspecified abnormalities of breathing: Secondary | ICD-10-CM | POA: Diagnosis not present

## 2018-11-17 DIAGNOSIS — I110 Hypertensive heart disease with heart failure: Secondary | ICD-10-CM | POA: Diagnosis not present

## 2018-11-17 DIAGNOSIS — I509 Heart failure, unspecified: Secondary | ICD-10-CM | POA: Diagnosis not present

## 2018-11-17 DIAGNOSIS — J80 Acute respiratory distress syndrome: Secondary | ICD-10-CM | POA: Diagnosis not present

## 2018-11-17 DIAGNOSIS — R0602 Shortness of breath: Secondary | ICD-10-CM | POA: Diagnosis not present

## 2018-11-18 ENCOUNTER — Encounter: Payer: Medicare HMO | Admitting: Podiatry

## 2018-11-18 ENCOUNTER — Other Ambulatory Visit: Payer: Self-pay

## 2018-11-18 DIAGNOSIS — E662 Morbid (severe) obesity with alveolar hypoventilation: Secondary | ICD-10-CM | POA: Diagnosis not present

## 2018-11-18 DIAGNOSIS — I509 Heart failure, unspecified: Secondary | ICD-10-CM | POA: Diagnosis not present

## 2018-11-18 DIAGNOSIS — G6181 Chronic inflammatory demyelinating polyneuritis: Secondary | ICD-10-CM | POA: Diagnosis not present

## 2018-11-18 DIAGNOSIS — E119 Type 2 diabetes mellitus without complications: Secondary | ICD-10-CM | POA: Diagnosis not present

## 2018-11-18 DIAGNOSIS — R0602 Shortness of breath: Secondary | ICD-10-CM | POA: Diagnosis not present

## 2018-11-18 DIAGNOSIS — R0902 Hypoxemia: Secondary | ICD-10-CM | POA: Diagnosis not present

## 2018-11-18 DIAGNOSIS — Z950 Presence of cardiac pacemaker: Secondary | ICD-10-CM | POA: Diagnosis not present

## 2018-11-18 DIAGNOSIS — I5023 Acute on chronic systolic (congestive) heart failure: Secondary | ICD-10-CM | POA: Diagnosis not present

## 2018-11-18 DIAGNOSIS — J9601 Acute respiratory failure with hypoxia: Secondary | ICD-10-CM | POA: Diagnosis not present

## 2018-11-18 DIAGNOSIS — F329 Major depressive disorder, single episode, unspecified: Secondary | ICD-10-CM | POA: Diagnosis not present

## 2018-11-18 DIAGNOSIS — Z6841 Body Mass Index (BMI) 40.0 and over, adult: Secondary | ICD-10-CM | POA: Diagnosis not present

## 2018-11-18 DIAGNOSIS — I110 Hypertensive heart disease with heart failure: Secondary | ICD-10-CM | POA: Diagnosis not present

## 2018-11-20 ENCOUNTER — Telehealth: Payer: Self-pay | Admitting: Podiatry

## 2018-11-20 DIAGNOSIS — F319 Bipolar disorder, unspecified: Secondary | ICD-10-CM | POA: Diagnosis not present

## 2018-11-20 DIAGNOSIS — G6181 Chronic inflammatory demyelinating polyneuritis: Secondary | ICD-10-CM | POA: Diagnosis not present

## 2018-11-20 DIAGNOSIS — I447 Left bundle-branch block, unspecified: Secondary | ICD-10-CM | POA: Diagnosis not present

## 2018-11-20 DIAGNOSIS — I5023 Acute on chronic systolic (congestive) heart failure: Secondary | ICD-10-CM | POA: Diagnosis not present

## 2018-11-20 DIAGNOSIS — S91301A Unspecified open wound, right foot, initial encounter: Secondary | ICD-10-CM | POA: Diagnosis not present

## 2018-11-20 DIAGNOSIS — I890 Lymphedema, not elsewhere classified: Secondary | ICD-10-CM | POA: Diagnosis not present

## 2018-11-20 DIAGNOSIS — I429 Cardiomyopathy, unspecified: Secondary | ICD-10-CM | POA: Diagnosis not present

## 2018-11-20 DIAGNOSIS — I110 Hypertensive heart disease with heart failure: Secondary | ICD-10-CM | POA: Diagnosis not present

## 2018-11-20 DIAGNOSIS — I480 Paroxysmal atrial fibrillation: Secondary | ICD-10-CM | POA: Diagnosis not present

## 2018-11-20 MED ORDER — SILVER SULFADIAZINE 1 % EX CREA: 1.0000 "application " | TOPICAL_CREAM | Freq: Every day | CUTANEOUS | 0 refills | Status: AC

## 2018-11-20 NOTE — Addendum Note (Signed)
Addended by: Harriett Sine D on: 11/20/2018 03:39 PM   Modules accepted: Orders

## 2018-11-20 NOTE — Telephone Encounter (Signed)
When pt was coming home from the hospital, a place opened below the right great toe amputation site. I wanted to know what Dr. March Rummage thinks I should put on there. I also advised his girlfriend to get him scheduled with Dr. March Rummage as soon as possible.

## 2018-11-20 NOTE — Telephone Encounter (Signed)
I spoke with Dr. March Rummage, he ordered silvadene to the site and cover with DSD. I informed Sharyn Lull [facility] and faxed orders to [facility]

## 2018-11-21 ENCOUNTER — Ambulatory Visit (INDEPENDENT_AMBULATORY_CARE_PROVIDER_SITE_OTHER): Payer: Medicare HMO | Admitting: Cardiology

## 2018-11-21 ENCOUNTER — Encounter: Payer: Self-pay | Admitting: Cardiology

## 2018-11-21 VITALS — BP 126/72 | HR 59 | Ht 74.0 in | Wt 363.0 lb

## 2018-11-21 DIAGNOSIS — I480 Paroxysmal atrial fibrillation: Secondary | ICD-10-CM

## 2018-11-21 DIAGNOSIS — I4892 Unspecified atrial flutter: Secondary | ICD-10-CM

## 2018-11-21 DIAGNOSIS — I5022 Chronic systolic (congestive) heart failure: Secondary | ICD-10-CM

## 2018-11-21 DIAGNOSIS — Z9981 Dependence on supplemental oxygen: Secondary | ICD-10-CM | POA: Diagnosis not present

## 2018-11-21 NOTE — Patient Instructions (Signed)
Medication Instructions:  Your physician recommends that you continue on your current medications as directed. Please refer to the Current Medication list given to you today.  If you need a refill on your cardiac medications before your next appointment, please call your pharmacy.   Lab work:  If you have labs (blood work) drawn today and your tests are completely normal, you will receive your results only by: Marland Kitchen MyChart Message (if you have MyChart) OR . A paper copy in the mail If you have any lab test that is abnormal or we need to change your treatment, we will call you to review the results.  Testing/Procedures: None   Follow-Up: At [facility], you and your health needs are our priority.  As part of our continuing mission to provide you with exceptional heart care, we have created designated Provider Care Teams.  These Care Teams include your primary Cardiologist (physician) and Advanced Practice Providers (APPs -  Physician Assistants and Nurse Practitioners) who all work together to provide you with the care you need, when you need it. You will need a follow up appointment in 6 months.  Please call our office 2 months in advance to schedule this appointment.  You may see Jenean Lindau, MD or another member of our [place] Provider Team in [place]: Jenne Campus, MD . Shirlee More, MD  Any Other Special Instructions Will Be Listed Below (If Applicable).

## 2018-11-21 NOTE — Progress Notes (Signed)
Cardiology Office Note:    Date:  11/21/2018   ID:  OBE AHLERS, DOB Jan 08, 1946, MRN [id number]  PCP:  Rochel Brome, MD  Cardiologist:  Jenean Lindau, MD   Referring MD: Rochel Brome, MD    ASSESSMENT:    1. Chronic systolic heart failure (HCC)   2. Paroxysmal atrial fibrillation ([place])   3. Atrial flutter, unspecified type ([place])   4. Morbid obesity ([place])   5. Oxygen dependent    PLAN:    In order of problems listed above:  1. Secondary prevention stressed with the patient.  Importance of compliance with diet and medication stressed.  His blood pressure is stable.  Diet was discussed for obesity and he vocalized understanding.  He also takes statin and the blood work is followed by his primary care physician.  I reviewed our electrophysiology colleague recommendations.  He was recommended not to have an ablation as he was found to have minimal episodes of flutter.  I discussed this with him. 2. Patient will be seen in follow-up appointment in 6 months or earlier if the patient has any concerns    Medication Adjustments/Labs and Tests Ordered: Current medicines are reviewed at length with the patient today.  Concerns regarding medicines are outlined above.  No orders of the defined types were placed in this encounter.  No orders of the defined types were placed in this encounter.    No chief complaint on file.    History of Present Illness:    Johnathan Wright is a 73 y.o. male.  Patient has multiple comorbidities including essential hypertension, coronary artery disease, dyslipidemia and morbid obesity.  He has sleep apnea and now his wife mentions to me that he is oxygen dependent.  Patient is on anticoagulation and takes it on a regular basis.  Recently he was admitted to the hospital with shortness of breath and he was told that it was sleep apnea issues.  This is managed by his primary care physician.  He is here in a wheelchair today he is morbidly obese but denies  any symptoms other than hip pain.  Past Medical History:  Diagnosis Date  . Atrial fibrillation ([place])   . Cellulitis   . CIDP (chronic inflammatory demyelinating polyneuropathy) ([place])   . Depression   . Diabetes ([place])   . Enlarged heart   . Gout     Past Surgical History:  Procedure Laterality Date  . APPENDECTOMY    . BIV ICD INSERTION CRT-D N/A 09/09/2018   Procedure: BIV ICD INSERTION CRT-D;  Surgeon: Evans Lance, MD;  Location: [place] [facility];  Service: Cardiovascular;  Laterality: N/A;  . LEFT HEART CATH AND CORONARY ANGIOGRAPHY N/A 09/06/2018   Procedure: LEFT HEART CATH AND CORONARY ANGIOGRAPHY;  Surgeon: Troy Sine, MD;  Location: [place] [facility];  Service: Cardiovascular;  Laterality: N/A;  . TEE WITHOUT CARDIOVERSION N/A 09/09/2018   Procedure: TRANSESOPHAGEAL ECHOCARDIOGRAM (TEE);  Surgeon: Sueanne Margarita, MD;  Location: [facility] ENDOSCOPY;  Service: Cardiovascular;  Laterality: N/A;  . VOCAL CORD LATERALIZATION, ENDOSCOPIC APPROACH W/ MLB      Current Medications: Current Meds  Medication Sig  . allopurinol (ZYLOPRIM) 100 MG tablet Take 100 mg by mouth daily.  Marland Kitchen apixaban (ELIQUIS) 5 MG TABS tablet Take 1 tablet (5 mg total) by mouth 2 (two) times daily. First dose PM of 09/11/18  . ARIPiprazole (ABILIFY) 2 MG tablet Take 2 mg by mouth daily.  Marland Kitchen atorvastatin (LIPITOR) 10 MG tablet  Take 10 mg by mouth daily.  . Bilberry 1000 MG CAPS Take by mouth.  . carvedilol (COREG) 3.125 MG tablet Take 1 tablet (3.125 mg total) by mouth 2 (two) times daily with a meal.  . clotrimazole (LOTRIMIN) 1 % cream Apply 1 application topically 2 (two) times daily.  . fluconazole (DIFLUCAN) 100 MG tablet Take 1 tablet by mouth daily.  . furosemide (LASIX) 20 MG tablet Take 20 mg by mouth daily.  . Insulin Glargine, 1 Unit Dial, (TOUJEO SOLOSTAR) 300 UNIT/ML SOPN Inject into the skin. Using 30 units daily  . L-Arginine 500 MG CAPS Take by mouth.  . levofloxacin (LEVAQUIN) 500 MG  tablet Take 1 tablet by mouth daily.  . LUTEIN PO Take 66 mg by mouth daily.  Marland Kitchen LYSINE PO Take 1,000 mg by mouth daily.  Marland Kitchen PREDNISONE, PAK, PO Take by mouth as directed.  . silver sulfADIAZINE (SILVADENE) 1 % cream Apply 1 application topically daily.  Marland Kitchen tiZANidine (ZANAFLEX) 4 MG tablet TAKE 1 TABLET BY MOUTH EVERY 6 TO 8 HOURS AS NEEDED. do not exceed 3 doses IN 24 hours. FOR MUSCLE SPASMS  . vortioxetine HBr (TRINTELLIX) 10 MG TABS Take 20 mg by mouth daily.      Allergies:   Other   Social History   Socioeconomic History  . Marital status: Divorced    Spouse name: Not on file  . Number of children: Not on file  . Years of education: Not on file  . Highest education level: Not on file  Occupational History  . Not on file  Social Needs  . Financial resource strain: Not on file  . Food insecurity:    Worry: Not on file    Inability: Not on file  . Transportation needs:    Medical: Not on file    Non-medical: Not on file  Tobacco Use  . Smoking status: Former Smoker    Last attempt to quit: 10/23/1969    Years since quitting: 49.1  . Smokeless tobacco: Never Used  Substance and Sexual Activity  . Alcohol use: Yes    Alcohol/week: 0.0 standard drinks    Comment: 6 pack a week  . Drug use: No  . Sexual activity: Not on file  Lifestyle  . Physical activity:    Days per week: Not on file    Minutes per session: Not on file  . Stress: Not on file  Relationships  . Social connections:    Talks on phone: Not on file    Gets together: Not on file    Attends religious service: Not on file    Active member of club or organization: Not on file    Attends meetings of clubs or organizations: Not on file    Relationship status: Not on file  Other Topics Concern  . Not on file  Social History Narrative  . Not on file     Family History: The patient's family history includes Alcohol abuse in his father; Alzheimer's disease in his mother; Arthritis in his sister; Asthma in  his father; Dementia in his mother; Diabetes in his sister; Heart disease in his mother.  ROS:   Please see the history of present illness.    All other systems reviewed and are negative.  EKGs/Labs/Other Studies Reviewed:    The following studies were reviewed today: I reviewed [facility] hospital records including blood work blood work and EKG findings.   Recent Labs: 09/05/2018: B Natriuretic Peptide 87.7 09/10/2018: BUN 24; Creatinine, Ser  1.70; Hemoglobin 14.3; Platelets 158; Potassium 4.1; Sodium 140  Recent Lipid Panel No results found for: CHOL, TRIG, HDL, CHOLHDL, VLDL, LDLCALC, LDLDIRECT  Physical Exam:    VS:  BP 126/72 (BP Location: Right Arm, Patient Position: Sitting, Cuff Size: Normal)   Pulse (!) 59   Ht 6\' 2"  (1.88 m)   Wt (!) 363 lb (164.7 kg)   SpO2 93%   BMI 46.61 kg/m     Wt Readings from Last 3 Encounters:  11/21/18 (!) 363 lb (164.7 kg)  10/28/18 (!) 378 lb (171.5 kg)  09/17/18 (!) 379 lb (171.9 kg)     GEN: Patient is in no acute distress HEENT: Normal NECK: No JVD; No carotid bruits LYMPHATICS: No lymphadenopathy CARDIAC: Hear sounds regular, 2/6 systolic murmur at the apex. RESPIRATORY:  Clear to auscultation without rales, wheezing or rhonchi  ABDOMEN: Soft, non-tender, non-distended MUSCULOSKELETAL: Changes of extensive cellulitis and edema seen in both lower extremities. SKIN: Warm and dry NEUROLOGIC:  Alert and oriented x 3 PSYCHIATRIC:  Normal affect   Signed, Jenean Lindau, MD  11/21/2018 10:33 AM    [place]

## 2018-11-22 ENCOUNTER — Other Ambulatory Visit: Payer: Self-pay

## 2018-11-22 NOTE — Patient Outreach (Signed)
Screening: New referral received from [facility] discharge report from [facility].  Placed call to patient and reviewed reason for call. Explained [facility] program.  Patient reports to me that he has a caregiver, he has all his medications and has a home health nurse.  Reports his DM is doing well. Reports home health is getting him a scale. Reports he follows his low salt diet and hates it. Reports that he has follow up planned with primary MD next week..   Denies needs. PLAN: will plan case closure as patient declines needs. Patient has agreed to a letter to be mail and he will call in the future if he needs assistance.  Will mail letter to patient and will send case closure to MD.  Tomasa Rand, RN, BSN, CEN [place] Coordinator [phone]

## 2018-11-24 DIAGNOSIS — I110 Hypertensive heart disease with heart failure: Secondary | ICD-10-CM | POA: Diagnosis not present

## 2018-11-24 DIAGNOSIS — S91301A Unspecified open wound, right foot, initial encounter: Secondary | ICD-10-CM | POA: Diagnosis not present

## 2018-11-24 DIAGNOSIS — I5023 Acute on chronic systolic (congestive) heart failure: Secondary | ICD-10-CM | POA: Diagnosis not present

## 2018-11-25 ENCOUNTER — Encounter: Payer: Self-pay | Admitting: Podiatry

## 2018-11-25 ENCOUNTER — Ambulatory Visit (INDEPENDENT_AMBULATORY_CARE_PROVIDER_SITE_OTHER): Payer: Medicare HMO | Admitting: Podiatry

## 2018-11-25 ENCOUNTER — Ambulatory Visit (INDEPENDENT_AMBULATORY_CARE_PROVIDER_SITE_OTHER): Payer: Medicare HMO

## 2018-11-25 VITALS — BP 118/66 | HR 60 | Temp 98.2°F

## 2018-11-25 DIAGNOSIS — L97519 Non-pressure chronic ulcer of other part of right foot with unspecified severity: Secondary | ICD-10-CM

## 2018-11-25 DIAGNOSIS — L928 Other granulomatous disorders of the skin and subcutaneous tissue: Secondary | ICD-10-CM | POA: Diagnosis not present

## 2018-11-25 NOTE — Progress Notes (Signed)
Subjective:  Patient ID: Johnathan Wright, male    DOB: 11/26/1945,  MRN: [id number]  Chief Complaint  Patient presents with  . 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 male presents for wound care.   Review of Systems: Negative except as noted in the HPI. Denies N/V/F/Ch.  Past Medical History:  Diagnosis Date  . Atrial fibrillation ([place])   . Cellulitis   . CIDP (chronic inflammatory demyelinating polyneuropathy) ([place])   . Depression   . Diabetes ([place])   . Enlarged heart   . Gout     Current Outpatient Medications:  .  allopurinol (ZYLOPRIM) 100 MG tablet, Take 100 mg by mouth daily., Disp: , Rfl:  .  apixaban (ELIQUIS) 5 MG TABS tablet, Take 1 tablet (5 mg total) by mouth 2 (two) times daily. First dose PM of 09/11/18, Disp: 60 tablet, Rfl: 3 .  ARIPiprazole (ABILIFY) 2 MG tablet, Take 2 mg by mouth daily., Disp: , Rfl:  .  atorvastatin (LIPITOR) 10 MG tablet, Take 10 mg by mouth daily., Disp: , Rfl:  .  Bilberry 1000 MG CAPS, Take by mouth., Disp: , Rfl:  .  carvedilol (COREG) 3.125 MG tablet, Take 1 tablet (3.125 mg total) by mouth 2 (two) times daily with a meal., Disp: 60 tablet, Rfl: 3 .  clotrimazole (LOTRIMIN) 1 % cream, Apply 1 application topically 2 (two) times daily., Disp: , Rfl:  .  fluconazole (DIFLUCAN) 100 MG tablet, Take 1 tablet by mouth daily., Disp: , Rfl:  .  furosemide (LASIX) 20 MG tablet, Take 20 mg by mouth daily., Disp: , Rfl:  .  Insulin Glargine, 1 Unit Dial, (TOUJEO SOLOSTAR) 300 UNIT/ML SOPN, Inject into the skin. Using 30 units daily, Disp: , Rfl:  .  L-Arginine 500 MG CAPS, Take by mouth., Disp: , Rfl:  .  levofloxacin (LEVAQUIN) 500 MG tablet, Take 1 tablet by mouth daily., Disp: , Rfl:  .  LUTEIN PO, Take 66 mg by mouth daily., Disp: , Rfl:  .  LYSINE PO, Take 1,000 mg by mouth daily., Disp: , Rfl:  .  PREDNISONE, PAK, PO, Take by  mouth as directed., Disp: , Rfl:  .  silver sulfADIAZINE (SILVADENE) 1 % cream, Apply 1 application topically daily., Disp: 50 g, Rfl: 0 .  tiZANidine (ZANAFLEX) 4 MG tablet, TAKE 1 TABLET BY MOUTH EVERY 6 TO 8 HOURS AS NEEDED. do not exceed 3 doses IN 24 hours. FOR MUSCLE SPASMS, Disp: , Rfl:  .  vortioxetine HBr (TRINTELLIX) 10 MG TABS, Take 20 mg by mouth daily. , Disp: , Rfl:   Social History   Tobacco Use  Smoking Status Former Smoker  . Last attempt to quit: 10/23/1969  . Years since quitting: 49.1  Smokeless Tobacco Never Used    Allergies  Allergen Reactions  . Other Cough    Cats   Objective:   Vitals:   11/25/18 1509  BP: 118/66  Pulse: 60  Temp: 98.2 F (36.8 C)   There is no height or weight on file to calculate BMI. Constitutional Well developed. Well nourished.  Vascular Dorsalis pedis pulses palpable bilaterally. Posterior tibial pulses palpable bilaterally. Capillary refill normal to all digits.  No cyanosis or clubbing noted. Pedal hair growth normal.  Neurologic Normal speech. Oriented to person, place, and time. Protective sensation absent  Dermatologic Nails thickened, dystrophic,  elongated. R 1st metatarsal area granular wound with skin slough. No warmth erythema signs of infection.  Orthopedic: No pain to palpation either foot.   Radiographs: None today Assessment:   1. Ulcer of right foot, unspecified ulcer stage ([place])   2. 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  Ulcer right first metatarsal area -Granulation tissue cauterized to promote epithelialization -Dressed with medihoney and DSD.  Return in about 2 weeks (around 12/09/2018) for Wound Care, Right.

## 2018-11-26 DIAGNOSIS — I5022 Chronic systolic (congestive) heart failure: Secondary | ICD-10-CM | POA: Diagnosis not present

## 2018-11-26 DIAGNOSIS — M25551 Pain in right hip: Secondary | ICD-10-CM | POA: Diagnosis not present

## 2018-11-26 DIAGNOSIS — J9601 Acute respiratory failure with hypoxia: Secondary | ICD-10-CM | POA: Diagnosis not present

## 2018-11-27 ENCOUNTER — Other Ambulatory Visit: Payer: Self-pay | Admitting: Podiatry

## 2018-11-27 DIAGNOSIS — I110 Hypertensive heart disease with heart failure: Secondary | ICD-10-CM | POA: Diagnosis not present

## 2018-11-27 DIAGNOSIS — I5023 Acute on chronic systolic (congestive) heart failure: Secondary | ICD-10-CM | POA: Diagnosis not present

## 2018-11-27 DIAGNOSIS — I890 Lymphedema, not elsewhere classified: Secondary | ICD-10-CM | POA: Diagnosis not present

## 2018-11-27 DIAGNOSIS — I447 Left bundle-branch block, unspecified: Secondary | ICD-10-CM | POA: Diagnosis not present

## 2018-11-27 DIAGNOSIS — I429 Cardiomyopathy, unspecified: Secondary | ICD-10-CM | POA: Diagnosis not present

## 2018-11-27 DIAGNOSIS — I480 Paroxysmal atrial fibrillation: Secondary | ICD-10-CM | POA: Diagnosis not present

## 2018-11-27 DIAGNOSIS — L928 Other granulomatous disorders of the skin and subcutaneous tissue: Secondary | ICD-10-CM

## 2018-11-27 DIAGNOSIS — F319 Bipolar disorder, unspecified: Secondary | ICD-10-CM | POA: Diagnosis not present

## 2018-11-27 DIAGNOSIS — G6181 Chronic inflammatory demyelinating polyneuritis: Secondary | ICD-10-CM | POA: Diagnosis not present

## 2018-11-27 DIAGNOSIS — S91301A Unspecified open wound, right foot, initial encounter: Secondary | ICD-10-CM | POA: Diagnosis not present

## 2018-11-27 DIAGNOSIS — L97519 Non-pressure chronic ulcer of other part of right foot with unspecified severity: Secondary | ICD-10-CM

## 2018-12-04 DIAGNOSIS — I447 Left bundle-branch block, unspecified: Secondary | ICD-10-CM | POA: Diagnosis not present

## 2018-12-04 DIAGNOSIS — F319 Bipolar disorder, unspecified: Secondary | ICD-10-CM | POA: Diagnosis not present

## 2018-12-04 DIAGNOSIS — G6181 Chronic inflammatory demyelinating polyneuritis: Secondary | ICD-10-CM | POA: Diagnosis not present

## 2018-12-04 DIAGNOSIS — I890 Lymphedema, not elsewhere classified: Secondary | ICD-10-CM | POA: Diagnosis not present

## 2018-12-04 DIAGNOSIS — I480 Paroxysmal atrial fibrillation: Secondary | ICD-10-CM | POA: Diagnosis not present

## 2018-12-04 DIAGNOSIS — S91301A Unspecified open wound, right foot, initial encounter: Secondary | ICD-10-CM | POA: Diagnosis not present

## 2018-12-04 DIAGNOSIS — I5023 Acute on chronic systolic (congestive) heart failure: Secondary | ICD-10-CM | POA: Diagnosis not present

## 2018-12-04 DIAGNOSIS — I429 Cardiomyopathy, unspecified: Secondary | ICD-10-CM | POA: Diagnosis not present

## 2018-12-04 DIAGNOSIS — I110 Hypertensive heart disease with heart failure: Secondary | ICD-10-CM | POA: Diagnosis not present

## 2018-12-10 ENCOUNTER — Ambulatory Visit: Payer: Medicare HMO | Admitting: Podiatry

## 2018-12-11 DIAGNOSIS — S91301A Unspecified open wound, right foot, initial encounter: Secondary | ICD-10-CM | POA: Diagnosis not present

## 2018-12-11 DIAGNOSIS — I890 Lymphedema, not elsewhere classified: Secondary | ICD-10-CM | POA: Diagnosis not present

## 2018-12-11 DIAGNOSIS — F319 Bipolar disorder, unspecified: Secondary | ICD-10-CM | POA: Diagnosis not present

## 2018-12-11 DIAGNOSIS — I447 Left bundle-branch block, unspecified: Secondary | ICD-10-CM | POA: Diagnosis not present

## 2018-12-11 DIAGNOSIS — I110 Hypertensive heart disease with heart failure: Secondary | ICD-10-CM | POA: Diagnosis not present

## 2018-12-11 DIAGNOSIS — I5023 Acute on chronic systolic (congestive) heart failure: Secondary | ICD-10-CM | POA: Diagnosis not present

## 2018-12-11 DIAGNOSIS — I480 Paroxysmal atrial fibrillation: Secondary | ICD-10-CM | POA: Diagnosis not present

## 2018-12-11 DIAGNOSIS — G6181 Chronic inflammatory demyelinating polyneuritis: Secondary | ICD-10-CM | POA: Diagnosis not present

## 2018-12-11 DIAGNOSIS — I429 Cardiomyopathy, unspecified: Secondary | ICD-10-CM | POA: Diagnosis not present

## 2018-12-16 ENCOUNTER — Encounter: Payer: Medicare HMO | Admitting: Cardiology

## 2018-12-16 DIAGNOSIS — M1611 Unilateral primary osteoarthritis, right hip: Secondary | ICD-10-CM | POA: Diagnosis not present

## 2018-12-17 ENCOUNTER — Encounter: Payer: Medicare HMO | Admitting: Cardiology

## 2018-12-18 DIAGNOSIS — I480 Paroxysmal atrial fibrillation: Secondary | ICD-10-CM | POA: Diagnosis not present

## 2018-12-18 DIAGNOSIS — I890 Lymphedema, not elsewhere classified: Secondary | ICD-10-CM | POA: Diagnosis not present

## 2018-12-18 DIAGNOSIS — F319 Bipolar disorder, unspecified: Secondary | ICD-10-CM | POA: Diagnosis not present

## 2018-12-18 DIAGNOSIS — I5023 Acute on chronic systolic (congestive) heart failure: Secondary | ICD-10-CM | POA: Diagnosis not present

## 2018-12-18 DIAGNOSIS — S91301A Unspecified open wound, right foot, initial encounter: Secondary | ICD-10-CM | POA: Diagnosis not present

## 2018-12-18 DIAGNOSIS — G6181 Chronic inflammatory demyelinating polyneuritis: Secondary | ICD-10-CM | POA: Diagnosis not present

## 2018-12-18 DIAGNOSIS — I429 Cardiomyopathy, unspecified: Secondary | ICD-10-CM | POA: Diagnosis not present

## 2018-12-18 DIAGNOSIS — I110 Hypertensive heart disease with heart failure: Secondary | ICD-10-CM | POA: Diagnosis not present

## 2018-12-18 DIAGNOSIS — I447 Left bundle-branch block, unspecified: Secondary | ICD-10-CM | POA: Diagnosis not present

## 2018-12-20 DIAGNOSIS — I509 Heart failure, unspecified: Secondary | ICD-10-CM | POA: Diagnosis not present

## 2018-12-24 ENCOUNTER — Ambulatory Visit: Payer: Medicare HMO | Admitting: Podiatry

## 2018-12-25 DIAGNOSIS — M7061 Trochanteric bursitis, right hip: Secondary | ICD-10-CM | POA: Diagnosis not present

## 2018-12-25 DIAGNOSIS — I5022 Chronic systolic (congestive) heart failure: Secondary | ICD-10-CM | POA: Diagnosis not present

## 2018-12-25 DIAGNOSIS — L304 Erythema intertrigo: Secondary | ICD-10-CM | POA: Diagnosis not present

## 2018-12-26 DIAGNOSIS — S91301A Unspecified open wound, right foot, initial encounter: Secondary | ICD-10-CM | POA: Diagnosis not present

## 2018-12-26 DIAGNOSIS — I890 Lymphedema, not elsewhere classified: Secondary | ICD-10-CM | POA: Diagnosis not present

## 2018-12-26 DIAGNOSIS — I447 Left bundle-branch block, unspecified: Secondary | ICD-10-CM | POA: Diagnosis not present

## 2018-12-26 DIAGNOSIS — F319 Bipolar disorder, unspecified: Secondary | ICD-10-CM | POA: Diagnosis not present

## 2018-12-26 DIAGNOSIS — G6181 Chronic inflammatory demyelinating polyneuritis: Secondary | ICD-10-CM | POA: Diagnosis not present

## 2018-12-26 DIAGNOSIS — I480 Paroxysmal atrial fibrillation: Secondary | ICD-10-CM | POA: Diagnosis not present

## 2018-12-26 DIAGNOSIS — I5023 Acute on chronic systolic (congestive) heart failure: Secondary | ICD-10-CM | POA: Diagnosis not present

## 2018-12-26 DIAGNOSIS — I110 Hypertensive heart disease with heart failure: Secondary | ICD-10-CM | POA: Diagnosis not present

## 2018-12-26 DIAGNOSIS — I429 Cardiomyopathy, unspecified: Secondary | ICD-10-CM | POA: Diagnosis not present

## 2018-12-30 DIAGNOSIS — R32 Unspecified urinary incontinence: Secondary | ICD-10-CM | POA: Diagnosis not present

## 2018-12-31 DIAGNOSIS — M7061 Trochanteric bursitis, right hip: Secondary | ICD-10-CM | POA: Diagnosis not present

## 2018-12-31 DIAGNOSIS — M549 Dorsalgia, unspecified: Secondary | ICD-10-CM | POA: Diagnosis not present

## 2019-01-01 DIAGNOSIS — I447 Left bundle-branch block, unspecified: Secondary | ICD-10-CM | POA: Diagnosis not present

## 2019-01-01 DIAGNOSIS — I480 Paroxysmal atrial fibrillation: Secondary | ICD-10-CM | POA: Diagnosis not present

## 2019-01-01 DIAGNOSIS — G6181 Chronic inflammatory demyelinating polyneuritis: Secondary | ICD-10-CM | POA: Diagnosis not present

## 2019-01-01 DIAGNOSIS — F319 Bipolar disorder, unspecified: Secondary | ICD-10-CM | POA: Diagnosis not present

## 2019-01-01 DIAGNOSIS — I110 Hypertensive heart disease with heart failure: Secondary | ICD-10-CM | POA: Diagnosis not present

## 2019-01-01 DIAGNOSIS — I5023 Acute on chronic systolic (congestive) heart failure: Secondary | ICD-10-CM | POA: Diagnosis not present

## 2019-01-01 DIAGNOSIS — I890 Lymphedema, not elsewhere classified: Secondary | ICD-10-CM | POA: Diagnosis not present

## 2019-01-01 DIAGNOSIS — I429 Cardiomyopathy, unspecified: Secondary | ICD-10-CM | POA: Diagnosis not present

## 2019-01-02 DIAGNOSIS — I429 Cardiomyopathy, unspecified: Secondary | ICD-10-CM | POA: Diagnosis not present

## 2019-01-02 DIAGNOSIS — I890 Lymphedema, not elsewhere classified: Secondary | ICD-10-CM | POA: Diagnosis not present

## 2019-01-02 DIAGNOSIS — I480 Paroxysmal atrial fibrillation: Secondary | ICD-10-CM | POA: Diagnosis not present

## 2019-01-02 DIAGNOSIS — I5023 Acute on chronic systolic (congestive) heart failure: Secondary | ICD-10-CM | POA: Diagnosis not present

## 2019-01-02 DIAGNOSIS — G6181 Chronic inflammatory demyelinating polyneuritis: Secondary | ICD-10-CM | POA: Diagnosis not present

## 2019-01-02 DIAGNOSIS — I447 Left bundle-branch block, unspecified: Secondary | ICD-10-CM | POA: Diagnosis not present

## 2019-01-02 DIAGNOSIS — I110 Hypertensive heart disease with heart failure: Secondary | ICD-10-CM | POA: Diagnosis not present

## 2019-01-02 DIAGNOSIS — F319 Bipolar disorder, unspecified: Secondary | ICD-10-CM | POA: Diagnosis not present

## 2019-01-06 ENCOUNTER — Ambulatory Visit (INDEPENDENT_AMBULATORY_CARE_PROVIDER_SITE_OTHER): Payer: Medicare HMO | Admitting: *Deleted

## 2019-01-06 ENCOUNTER — Encounter: Payer: Medicare HMO | Admitting: Cardiology

## 2019-01-06 DIAGNOSIS — I5022 Chronic systolic (congestive) heart failure: Secondary | ICD-10-CM

## 2019-01-08 DIAGNOSIS — I110 Hypertensive heart disease with heart failure: Secondary | ICD-10-CM | POA: Diagnosis not present

## 2019-01-08 DIAGNOSIS — G6181 Chronic inflammatory demyelinating polyneuritis: Secondary | ICD-10-CM | POA: Diagnosis not present

## 2019-01-08 DIAGNOSIS — F319 Bipolar disorder, unspecified: Secondary | ICD-10-CM | POA: Diagnosis not present

## 2019-01-08 DIAGNOSIS — I447 Left bundle-branch block, unspecified: Secondary | ICD-10-CM | POA: Diagnosis not present

## 2019-01-08 DIAGNOSIS — I480 Paroxysmal atrial fibrillation: Secondary | ICD-10-CM | POA: Diagnosis not present

## 2019-01-08 DIAGNOSIS — I5023 Acute on chronic systolic (congestive) heart failure: Secondary | ICD-10-CM | POA: Diagnosis not present

## 2019-01-08 DIAGNOSIS — I429 Cardiomyopathy, unspecified: Secondary | ICD-10-CM | POA: Diagnosis not present

## 2019-01-08 DIAGNOSIS — I890 Lymphedema, not elsewhere classified: Secondary | ICD-10-CM | POA: Diagnosis not present

## 2019-01-12 LAB — CUP PACEART REMOTE DEVICE CHECK
Date Time Interrogation Session: 20200322124612
Implantable Lead Implant Date: 20191118
Implantable Lead Implant Date: 20191118
Implantable Lead Implant Date: 20191118
Implantable Lead Location: 753858
Implantable Lead Location: 753859
Implantable Lead Location: 753860
Implantable Lead Model: 293
Implantable Lead Model: 4674
Implantable Lead Model: 7741
Implantable Lead Serial Number: 1025951
Implantable Lead Serial Number: 442535
Implantable Lead Serial Number: 833002
Implantable Pulse Generator Implant Date: 20191118
Pulse Gen Serial Number: 219271

## 2019-01-13 ENCOUNTER — Other Ambulatory Visit: Payer: Self-pay

## 2019-01-14 ENCOUNTER — Encounter: Payer: Self-pay | Admitting: Cardiology

## 2019-01-14 DIAGNOSIS — I110 Hypertensive heart disease with heart failure: Secondary | ICD-10-CM | POA: Diagnosis not present

## 2019-01-14 DIAGNOSIS — I890 Lymphedema, not elsewhere classified: Secondary | ICD-10-CM | POA: Diagnosis not present

## 2019-01-14 DIAGNOSIS — I447 Left bundle-branch block, unspecified: Secondary | ICD-10-CM | POA: Diagnosis not present

## 2019-01-14 DIAGNOSIS — I5023 Acute on chronic systolic (congestive) heart failure: Secondary | ICD-10-CM | POA: Diagnosis not present

## 2019-01-14 DIAGNOSIS — G6181 Chronic inflammatory demyelinating polyneuritis: Secondary | ICD-10-CM | POA: Diagnosis not present

## 2019-01-14 DIAGNOSIS — I429 Cardiomyopathy, unspecified: Secondary | ICD-10-CM | POA: Diagnosis not present

## 2019-01-14 DIAGNOSIS — I480 Paroxysmal atrial fibrillation: Secondary | ICD-10-CM | POA: Diagnosis not present

## 2019-01-14 DIAGNOSIS — F319 Bipolar disorder, unspecified: Secondary | ICD-10-CM | POA: Diagnosis not present

## 2019-01-14 NOTE — Progress Notes (Signed)
Remote ICD transmission.   

## 2019-01-16 DIAGNOSIS — I447 Left bundle-branch block, unspecified: Secondary | ICD-10-CM | POA: Diagnosis not present

## 2019-01-16 DIAGNOSIS — I5023 Acute on chronic systolic (congestive) heart failure: Secondary | ICD-10-CM | POA: Diagnosis not present

## 2019-01-16 DIAGNOSIS — G6181 Chronic inflammatory demyelinating polyneuritis: Secondary | ICD-10-CM | POA: Diagnosis not present

## 2019-01-16 DIAGNOSIS — I890 Lymphedema, not elsewhere classified: Secondary | ICD-10-CM | POA: Diagnosis not present

## 2019-01-16 DIAGNOSIS — I480 Paroxysmal atrial fibrillation: Secondary | ICD-10-CM | POA: Diagnosis not present

## 2019-01-16 DIAGNOSIS — I429 Cardiomyopathy, unspecified: Secondary | ICD-10-CM | POA: Diagnosis not present

## 2019-01-16 DIAGNOSIS — F319 Bipolar disorder, unspecified: Secondary | ICD-10-CM | POA: Diagnosis not present

## 2019-01-16 DIAGNOSIS — I110 Hypertensive heart disease with heart failure: Secondary | ICD-10-CM | POA: Diagnosis not present

## 2019-01-17 DIAGNOSIS — I110 Hypertensive heart disease with heart failure: Secondary | ICD-10-CM | POA: Diagnosis not present

## 2019-01-17 DIAGNOSIS — I890 Lymphedema, not elsewhere classified: Secondary | ICD-10-CM | POA: Diagnosis not present

## 2019-01-17 DIAGNOSIS — I447 Left bundle-branch block, unspecified: Secondary | ICD-10-CM | POA: Diagnosis not present

## 2019-01-17 DIAGNOSIS — I5023 Acute on chronic systolic (congestive) heart failure: Secondary | ICD-10-CM | POA: Diagnosis not present

## 2019-01-17 DIAGNOSIS — I480 Paroxysmal atrial fibrillation: Secondary | ICD-10-CM | POA: Diagnosis not present

## 2019-01-17 DIAGNOSIS — G6181 Chronic inflammatory demyelinating polyneuritis: Secondary | ICD-10-CM | POA: Diagnosis not present

## 2019-01-17 DIAGNOSIS — F319 Bipolar disorder, unspecified: Secondary | ICD-10-CM | POA: Diagnosis not present

## 2019-01-17 DIAGNOSIS — I429 Cardiomyopathy, unspecified: Secondary | ICD-10-CM | POA: Diagnosis not present

## 2019-01-18 DIAGNOSIS — I509 Heart failure, unspecified: Secondary | ICD-10-CM | POA: Diagnosis not present

## 2019-01-22 DIAGNOSIS — E1121 Type 2 diabetes mellitus with diabetic nephropathy: Secondary | ICD-10-CM | POA: Diagnosis not present

## 2019-01-22 DIAGNOSIS — Z87891 Personal history of nicotine dependence: Secondary | ICD-10-CM | POA: Diagnosis not present

## 2019-01-22 DIAGNOSIS — Z9981 Dependence on supplemental oxygen: Secondary | ICD-10-CM | POA: Diagnosis not present

## 2019-01-22 DIAGNOSIS — Z982 Presence of cerebrospinal fluid drainage device: Secondary | ICD-10-CM | POA: Diagnosis not present

## 2019-01-22 DIAGNOSIS — E1142 Type 2 diabetes mellitus with diabetic polyneuropathy: Secondary | ICD-10-CM | POA: Diagnosis not present

## 2019-01-22 DIAGNOSIS — M1611 Unilateral primary osteoarthritis, right hip: Secondary | ICD-10-CM | POA: Diagnosis not present

## 2019-01-22 DIAGNOSIS — I110 Hypertensive heart disease with heart failure: Secondary | ICD-10-CM | POA: Diagnosis not present

## 2019-01-22 DIAGNOSIS — G910 Communicating hydrocephalus: Secondary | ICD-10-CM | POA: Diagnosis not present

## 2019-01-22 DIAGNOSIS — M7061 Trochanteric bursitis, right hip: Secondary | ICD-10-CM | POA: Diagnosis not present

## 2019-01-22 DIAGNOSIS — G4733 Obstructive sleep apnea (adult) (pediatric): Secondary | ICD-10-CM | POA: Diagnosis not present

## 2019-01-22 DIAGNOSIS — I480 Paroxysmal atrial fibrillation: Secondary | ICD-10-CM | POA: Diagnosis not present

## 2019-01-22 DIAGNOSIS — I5023 Acute on chronic systolic (congestive) heart failure: Secondary | ICD-10-CM | POA: Diagnosis not present

## 2019-01-22 DIAGNOSIS — Z466 Encounter for fitting and adjustment of urinary device: Secondary | ICD-10-CM | POA: Diagnosis not present

## 2019-01-22 DIAGNOSIS — Z8631 Personal history of diabetic foot ulcer: Secondary | ICD-10-CM | POA: Diagnosis not present

## 2019-01-22 DIAGNOSIS — I429 Cardiomyopathy, unspecified: Secondary | ICD-10-CM | POA: Diagnosis not present

## 2019-01-22 DIAGNOSIS — G6181 Chronic inflammatory demyelinating polyneuritis: Secondary | ICD-10-CM | POA: Diagnosis not present

## 2019-01-22 DIAGNOSIS — Z95810 Presence of automatic (implantable) cardiac defibrillator: Secondary | ICD-10-CM | POA: Diagnosis not present

## 2019-01-22 DIAGNOSIS — Z89411 Acquired absence of right great toe: Secondary | ICD-10-CM | POA: Diagnosis not present

## 2019-01-22 DIAGNOSIS — I890 Lymphedema, not elsewhere classified: Secondary | ICD-10-CM | POA: Diagnosis not present

## 2019-01-22 DIAGNOSIS — I447 Left bundle-branch block, unspecified: Secondary | ICD-10-CM | POA: Diagnosis not present

## 2019-01-27 DIAGNOSIS — I429 Cardiomyopathy, unspecified: Secondary | ICD-10-CM | POA: Diagnosis not present

## 2019-01-27 DIAGNOSIS — E1142 Type 2 diabetes mellitus with diabetic polyneuropathy: Secondary | ICD-10-CM | POA: Diagnosis not present

## 2019-01-27 DIAGNOSIS — I110 Hypertensive heart disease with heart failure: Secondary | ICD-10-CM | POA: Diagnosis not present

## 2019-01-27 DIAGNOSIS — Z95810 Presence of automatic (implantable) cardiac defibrillator: Secondary | ICD-10-CM | POA: Diagnosis not present

## 2019-01-27 DIAGNOSIS — I480 Paroxysmal atrial fibrillation: Secondary | ICD-10-CM | POA: Diagnosis not present

## 2019-01-27 DIAGNOSIS — Z89411 Acquired absence of right great toe: Secondary | ICD-10-CM | POA: Diagnosis not present

## 2019-01-27 DIAGNOSIS — G910 Communicating hydrocephalus: Secondary | ICD-10-CM | POA: Diagnosis not present

## 2019-01-27 DIAGNOSIS — Z466 Encounter for fitting and adjustment of urinary device: Secondary | ICD-10-CM | POA: Diagnosis not present

## 2019-01-27 DIAGNOSIS — G4733 Obstructive sleep apnea (adult) (pediatric): Secondary | ICD-10-CM | POA: Diagnosis not present

## 2019-01-27 DIAGNOSIS — Z87891 Personal history of nicotine dependence: Secondary | ICD-10-CM | POA: Diagnosis not present

## 2019-01-27 DIAGNOSIS — Z982 Presence of cerebrospinal fluid drainage device: Secondary | ICD-10-CM | POA: Diagnosis not present

## 2019-01-27 DIAGNOSIS — I890 Lymphedema, not elsewhere classified: Secondary | ICD-10-CM | POA: Diagnosis not present

## 2019-01-27 DIAGNOSIS — E1121 Type 2 diabetes mellitus with diabetic nephropathy: Secondary | ICD-10-CM | POA: Diagnosis not present

## 2019-01-27 DIAGNOSIS — Z8631 Personal history of diabetic foot ulcer: Secondary | ICD-10-CM | POA: Diagnosis not present

## 2019-01-27 DIAGNOSIS — G6181 Chronic inflammatory demyelinating polyneuritis: Secondary | ICD-10-CM | POA: Diagnosis not present

## 2019-01-27 DIAGNOSIS — I447 Left bundle-branch block, unspecified: Secondary | ICD-10-CM | POA: Diagnosis not present

## 2019-01-27 DIAGNOSIS — I5023 Acute on chronic systolic (congestive) heart failure: Secondary | ICD-10-CM | POA: Diagnosis not present

## 2019-01-27 DIAGNOSIS — M7061 Trochanteric bursitis, right hip: Secondary | ICD-10-CM | POA: Diagnosis not present

## 2019-01-27 DIAGNOSIS — Z9981 Dependence on supplemental oxygen: Secondary | ICD-10-CM | POA: Diagnosis not present

## 2019-01-27 DIAGNOSIS — M1611 Unilateral primary osteoarthritis, right hip: Secondary | ICD-10-CM | POA: Diagnosis not present

## 2019-02-03 DIAGNOSIS — E782 Mixed hyperlipidemia: Secondary | ICD-10-CM | POA: Diagnosis not present

## 2019-02-03 DIAGNOSIS — I4819 Other persistent atrial fibrillation: Secondary | ICD-10-CM | POA: Diagnosis not present

## 2019-02-03 DIAGNOSIS — M7061 Trochanteric bursitis, right hip: Secondary | ICD-10-CM | POA: Diagnosis not present

## 2019-02-03 DIAGNOSIS — E1121 Type 2 diabetes mellitus with diabetic nephropathy: Secondary | ICD-10-CM | POA: Diagnosis not present

## 2019-02-04 DIAGNOSIS — Z9981 Dependence on supplemental oxygen: Secondary | ICD-10-CM | POA: Diagnosis not present

## 2019-02-04 DIAGNOSIS — I110 Hypertensive heart disease with heart failure: Secondary | ICD-10-CM | POA: Diagnosis not present

## 2019-02-04 DIAGNOSIS — I447 Left bundle-branch block, unspecified: Secondary | ICD-10-CM | POA: Diagnosis not present

## 2019-02-04 DIAGNOSIS — E1142 Type 2 diabetes mellitus with diabetic polyneuropathy: Secondary | ICD-10-CM | POA: Diagnosis not present

## 2019-02-04 DIAGNOSIS — I890 Lymphedema, not elsewhere classified: Secondary | ICD-10-CM | POA: Diagnosis not present

## 2019-02-04 DIAGNOSIS — Z8631 Personal history of diabetic foot ulcer: Secondary | ICD-10-CM | POA: Diagnosis not present

## 2019-02-04 DIAGNOSIS — Z95810 Presence of automatic (implantable) cardiac defibrillator: Secondary | ICD-10-CM | POA: Diagnosis not present

## 2019-02-04 DIAGNOSIS — Z466 Encounter for fitting and adjustment of urinary device: Secondary | ICD-10-CM | POA: Diagnosis not present

## 2019-02-04 DIAGNOSIS — M7061 Trochanteric bursitis, right hip: Secondary | ICD-10-CM | POA: Diagnosis not present

## 2019-02-04 DIAGNOSIS — Z982 Presence of cerebrospinal fluid drainage device: Secondary | ICD-10-CM | POA: Diagnosis not present

## 2019-02-04 DIAGNOSIS — Z87891 Personal history of nicotine dependence: Secondary | ICD-10-CM | POA: Diagnosis not present

## 2019-02-04 DIAGNOSIS — I5023 Acute on chronic systolic (congestive) heart failure: Secondary | ICD-10-CM | POA: Diagnosis not present

## 2019-02-04 DIAGNOSIS — E1121 Type 2 diabetes mellitus with diabetic nephropathy: Secondary | ICD-10-CM | POA: Diagnosis not present

## 2019-02-04 DIAGNOSIS — G6181 Chronic inflammatory demyelinating polyneuritis: Secondary | ICD-10-CM | POA: Diagnosis not present

## 2019-02-04 DIAGNOSIS — G910 Communicating hydrocephalus: Secondary | ICD-10-CM | POA: Diagnosis not present

## 2019-02-04 DIAGNOSIS — M1611 Unilateral primary osteoarthritis, right hip: Secondary | ICD-10-CM | POA: Diagnosis not present

## 2019-02-04 DIAGNOSIS — G4733 Obstructive sleep apnea (adult) (pediatric): Secondary | ICD-10-CM | POA: Diagnosis not present

## 2019-02-04 DIAGNOSIS — I429 Cardiomyopathy, unspecified: Secondary | ICD-10-CM | POA: Diagnosis not present

## 2019-02-04 DIAGNOSIS — I480 Paroxysmal atrial fibrillation: Secondary | ICD-10-CM | POA: Diagnosis not present

## 2019-02-04 DIAGNOSIS — Z89411 Acquired absence of right great toe: Secondary | ICD-10-CM | POA: Diagnosis not present

## 2019-02-10 DIAGNOSIS — Z89411 Acquired absence of right great toe: Secondary | ICD-10-CM | POA: Diagnosis not present

## 2019-02-10 DIAGNOSIS — E1121 Type 2 diabetes mellitus with diabetic nephropathy: Secondary | ICD-10-CM | POA: Diagnosis not present

## 2019-02-10 DIAGNOSIS — I447 Left bundle-branch block, unspecified: Secondary | ICD-10-CM | POA: Diagnosis not present

## 2019-02-10 DIAGNOSIS — I5023 Acute on chronic systolic (congestive) heart failure: Secondary | ICD-10-CM | POA: Diagnosis not present

## 2019-02-10 DIAGNOSIS — M7061 Trochanteric bursitis, right hip: Secondary | ICD-10-CM | POA: Diagnosis not present

## 2019-02-10 DIAGNOSIS — Z95810 Presence of automatic (implantable) cardiac defibrillator: Secondary | ICD-10-CM | POA: Diagnosis not present

## 2019-02-10 DIAGNOSIS — Z8631 Personal history of diabetic foot ulcer: Secondary | ICD-10-CM | POA: Diagnosis not present

## 2019-02-10 DIAGNOSIS — I480 Paroxysmal atrial fibrillation: Secondary | ICD-10-CM | POA: Diagnosis not present

## 2019-02-10 DIAGNOSIS — M1611 Unilateral primary osteoarthritis, right hip: Secondary | ICD-10-CM | POA: Diagnosis not present

## 2019-02-10 DIAGNOSIS — E1142 Type 2 diabetes mellitus with diabetic polyneuropathy: Secondary | ICD-10-CM | POA: Diagnosis not present

## 2019-02-10 DIAGNOSIS — I110 Hypertensive heart disease with heart failure: Secondary | ICD-10-CM | POA: Diagnosis not present

## 2019-02-10 DIAGNOSIS — G6181 Chronic inflammatory demyelinating polyneuritis: Secondary | ICD-10-CM | POA: Diagnosis not present

## 2019-02-10 DIAGNOSIS — Z466 Encounter for fitting and adjustment of urinary device: Secondary | ICD-10-CM | POA: Diagnosis not present

## 2019-02-10 DIAGNOSIS — G4733 Obstructive sleep apnea (adult) (pediatric): Secondary | ICD-10-CM | POA: Diagnosis not present

## 2019-02-10 DIAGNOSIS — Z87891 Personal history of nicotine dependence: Secondary | ICD-10-CM | POA: Diagnosis not present

## 2019-02-10 DIAGNOSIS — G910 Communicating hydrocephalus: Secondary | ICD-10-CM | POA: Diagnosis not present

## 2019-02-10 DIAGNOSIS — I890 Lymphedema, not elsewhere classified: Secondary | ICD-10-CM | POA: Diagnosis not present

## 2019-02-10 DIAGNOSIS — I429 Cardiomyopathy, unspecified: Secondary | ICD-10-CM | POA: Diagnosis not present

## 2019-02-10 DIAGNOSIS — Z9981 Dependence on supplemental oxygen: Secondary | ICD-10-CM | POA: Diagnosis not present

## 2019-02-10 DIAGNOSIS — Z982 Presence of cerebrospinal fluid drainage device: Secondary | ICD-10-CM | POA: Diagnosis not present

## 2019-02-13 DIAGNOSIS — R0902 Hypoxemia: Secondary | ICD-10-CM | POA: Diagnosis not present

## 2019-02-17 ENCOUNTER — Telehealth (INDEPENDENT_AMBULATORY_CARE_PROVIDER_SITE_OTHER): Payer: Medicare Other | Admitting: Cardiology

## 2019-02-17 ENCOUNTER — Encounter: Payer: Self-pay | Admitting: Cardiology

## 2019-02-17 ENCOUNTER — Other Ambulatory Visit: Payer: Self-pay

## 2019-02-17 DIAGNOSIS — I5043 Acute on chronic combined systolic (congestive) and diastolic (congestive) heart failure: Secondary | ICD-10-CM | POA: Diagnosis not present

## 2019-02-17 DIAGNOSIS — Z9981 Dependence on supplemental oxygen: Secondary | ICD-10-CM | POA: Diagnosis not present

## 2019-02-17 DIAGNOSIS — I5023 Acute on chronic systolic (congestive) heart failure: Secondary | ICD-10-CM | POA: Diagnosis not present

## 2019-02-17 DIAGNOSIS — M1611 Unilateral primary osteoarthritis, right hip: Secondary | ICD-10-CM | POA: Diagnosis not present

## 2019-02-17 DIAGNOSIS — G6181 Chronic inflammatory demyelinating polyneuritis: Secondary | ICD-10-CM | POA: Diagnosis not present

## 2019-02-17 DIAGNOSIS — Z466 Encounter for fitting and adjustment of urinary device: Secondary | ICD-10-CM | POA: Diagnosis not present

## 2019-02-17 DIAGNOSIS — I429 Cardiomyopathy, unspecified: Secondary | ICD-10-CM | POA: Diagnosis not present

## 2019-02-17 DIAGNOSIS — I890 Lymphedema, not elsewhere classified: Secondary | ICD-10-CM | POA: Diagnosis not present

## 2019-02-17 DIAGNOSIS — Z89411 Acquired absence of right great toe: Secondary | ICD-10-CM | POA: Diagnosis not present

## 2019-02-17 DIAGNOSIS — G4733 Obstructive sleep apnea (adult) (pediatric): Secondary | ICD-10-CM | POA: Diagnosis not present

## 2019-02-17 DIAGNOSIS — E1121 Type 2 diabetes mellitus with diabetic nephropathy: Secondary | ICD-10-CM | POA: Diagnosis not present

## 2019-02-17 DIAGNOSIS — Z982 Presence of cerebrospinal fluid drainage device: Secondary | ICD-10-CM | POA: Diagnosis not present

## 2019-02-17 DIAGNOSIS — I447 Left bundle-branch block, unspecified: Secondary | ICD-10-CM | POA: Diagnosis not present

## 2019-02-17 DIAGNOSIS — Z87891 Personal history of nicotine dependence: Secondary | ICD-10-CM | POA: Diagnosis not present

## 2019-02-17 DIAGNOSIS — I480 Paroxysmal atrial fibrillation: Secondary | ICD-10-CM | POA: Diagnosis not present

## 2019-02-17 DIAGNOSIS — M7061 Trochanteric bursitis, right hip: Secondary | ICD-10-CM | POA: Diagnosis not present

## 2019-02-17 DIAGNOSIS — E1142 Type 2 diabetes mellitus with diabetic polyneuropathy: Secondary | ICD-10-CM | POA: Diagnosis not present

## 2019-02-17 DIAGNOSIS — Z8631 Personal history of diabetic foot ulcer: Secondary | ICD-10-CM | POA: Diagnosis not present

## 2019-02-17 DIAGNOSIS — I110 Hypertensive heart disease with heart failure: Secondary | ICD-10-CM | POA: Diagnosis not present

## 2019-02-17 DIAGNOSIS — Z95810 Presence of automatic (implantable) cardiac defibrillator: Secondary | ICD-10-CM | POA: Diagnosis not present

## 2019-02-17 DIAGNOSIS — G910 Communicating hydrocephalus: Secondary | ICD-10-CM | POA: Diagnosis not present

## 2019-02-17 NOTE — Progress Notes (Signed)
Electrophysiology TeleHealth Note   Due to national recommendations of social distancing due to COVID 19, an audio/video telehealth visit is felt to be most appropriate for this patient at this time.  See Epic message for the patient's consent to telehealth for [facility].   Date:  02/17/2019   ID:  Johnathan Wright, DOB 02-Jun-1946, MRN [id number]  Location: patient's home  Provider location: [address] [place]  Evaluation Performed: Follow-up visit  PCP:  Rochel Brome, MD  Cardiologist:  Jenean Lindau, MD  Electrophysiologist:  Dr Curt Bears  Chief Complaint:  CHF  History of Present Illness:    Johnathan Wright is a 73 y.o. male who presents via audio/video conferencing for a telehealth visit today.  Since last being seen in our clinic, the patient reports doing very well.  Today, he denies symptoms of palpitations, chest pain, shortness of breath,  lower extremity edema, dizziness, presyncope, or syncope.  The patient is otherwise without complaint today.  The patient denies symptoms of fevers, chills, cough, or new SOB worrisome for COVID 19.  History of DM, morbid obesity, right toe amputation, neuropathy, LBBB. He is s/p [facility] scientific CRT-D.  He presented to the hospital 09/09/2018 and had his defibrillator placed at that time.  He presented with symptomatic bradycardia.  Today, denies symptoms of palpitations, chest pain, shortness of breath, orthopnea, PND, lower extremity edema, claudication, dizziness, presyncope, syncope, bleeding, or neurologic sequela. The patient is tolerating medications without difficulties.  He has been diagnosed with sleep apnea, but has been noncompliant with his nighttime therapy.  He does not have chest pain or shortness of breath.  Per his caregiver, he does lead quite a sedentary lifestyle.  He does not get up and move around very much at all throughout the day.  Past Medical History:  Diagnosis Date  . Atrial  fibrillation ([place])   . Cellulitis   . CIDP (chronic inflammatory demyelinating polyneuropathy) ([place])   . Depression   . Diabetes ([place])   . Enlarged heart   . Gout     Past Surgical History:  Procedure Laterality Date  . APPENDECTOMY    . BIV ICD INSERTION CRT-D N/A 09/09/2018   Procedure: BIV ICD INSERTION CRT-D;  Surgeon: Evans Lance, MD;  Location: [place] CV LAB;  Service: Cardiovascular;  Laterality: N/A;  . LEFT HEART CATH AND CORONARY ANGIOGRAPHY N/A 09/06/2018   Procedure: LEFT HEART CATH AND CORONARY ANGIOGRAPHY;  Surgeon: Troy Sine, MD;  Location: [place] CV LAB;  Service: Cardiovascular;  Laterality: N/A;  . TEE WITHOUT CARDIOVERSION N/A 09/09/2018   Procedure: TRANSESOPHAGEAL ECHOCARDIOGRAM (TEE);  Surgeon: Sueanne Margarita, MD;  Location: [facility] ENDOSCOPY;  Service: Cardiovascular;  Laterality: N/A;  . VOCAL CORD LATERALIZATION, ENDOSCOPIC APPROACH W/ MLB      Current Outpatient Medications  Medication Sig Dispense Refill  . allopurinol (ZYLOPRIM) 100 MG tablet Take 100 mg by mouth daily.    Marland Kitchen apixaban (ELIQUIS) 5 MG TABS tablet Take 1 tablet (5 mg total) by mouth 2 (two) times daily. First dose PM of 09/11/18 60 tablet 3  . ARIPiprazole (ABILIFY) 2 MG tablet Take 2 mg by mouth daily.    Marland Kitchen atorvastatin (LIPITOR) 10 MG tablet Take 10 mg by mouth daily.    . Bilberry 1000 MG CAPS Take by mouth.    . carvedilol (COREG) 3.125 MG tablet Take 1 tablet (3.125 mg total) by mouth 2 (two) times daily with a meal. 60  tablet 3  . clotrimazole (LOTRIMIN) 1 % cream Apply 1 application topically 2 (two) times daily.    . fluconazole (DIFLUCAN) 100 MG tablet Take 1 tablet by mouth daily.    . furosemide (LASIX) 20 MG tablet Take 20 mg by mouth daily.    . Insulin Glargine, 1 Unit Dial, (TOUJEO SOLOSTAR) 300 UNIT/ML SOPN Inject into the skin. Using 30 units daily    . L-Arginine 500 MG CAPS Take by mouth.    . levofloxacin (LEVAQUIN) 500 MG tablet Take 1 tablet by mouth  daily.    . LUTEIN PO Take 66 mg by mouth daily.    Marland Kitchen LYSINE PO Take 1,000 mg by mouth daily.    Marland Kitchen PREDNISONE, PAK, PO Take by mouth as directed.    . silver sulfADIAZINE (SILVADENE) 1 % cream Apply 1 application topically daily. 50 g 0  . tiZANidine (ZANAFLEX) 4 MG tablet TAKE 1 TABLET BY MOUTH EVERY 6 TO 8 HOURS AS NEEDED. do not exceed 3 doses IN 24 hours. FOR MUSCLE SPASMS    . vortioxetine HBr (TRINTELLIX) 10 MG TABS Take 20 mg by mouth daily.      No current facility-administered medications for this visit.     Allergies:   Other   Social History:  The patient  reports that he quit smoking about 49 years ago. He has never used smokeless tobacco. He reports current alcohol use. He reports that he does not use drugs.   Family History:  The patient's  family history includes Alcohol abuse in his father; Alzheimer's disease in his mother; Arthritis in his sister; Asthma in his father; Dementia in his mother; Diabetes in his sister; Heart disease in his mother.   ROS:  Please see the history of present illness.   All other systems are personally reviewed and negative.    Exam:    Vital Signs:  There were no vitals taken for this visit.  Over the phone, no acute distress, no shortness of breath.  Labs/Other Tests and Data Reviewed:    Recent Labs: 09/05/2018: B Natriuretic Peptide 87.7 09/10/2018: BUN 24; Creatinine, Ser 1.70; Hemoglobin 14.3; Platelets 158; Potassium 4.1; Sodium 140   Wt Readings from Last 3 Encounters:  11/21/18 (!) 363 lb (164.7 kg)  10/28/18 (!) 378 lb (171.5 kg)  09/17/18 (!) 379 lb (171.9 kg)     Other studies personally reviewed: Additional studies/ records that were reviewed today include: ECG 11/18/2018 personally reviewed Review of the above records today demonstrates: Sinus rhythm, ventricular paced  Last device remote is reviewed from [place] PDF dated 01/03/19 which reveals normal device function, no arrhythmias    ASSESSMENT & PLAN:    1.   Chronic nonischemic cardiomyopathy: Status post [employer] CRT-D.  Currently on Coreg, but no ACE inhibitor/ARB due to kidney issues.   2.  Symptomatic bradycardia: Status post [employer] CRT-D implanted 09/09/2018.  Functioning appropriately.  No changes.  3.  Typical atrial flutter: Found on hospital admission.  Was initially thought he would require ablation.  Has had minimal atrial flutter since being diagnosed from the hospital.  Continue current anticoagulation with Eliquis.  This patients CHA2DS2-VASc Score and unadjusted Ischemic Stroke Rate (% per year) is equal to 4.8 % stroke rate/year from a score of 4  4.  Coronary artery disease: Nonobstructive as per catheterization.  No current chest pain.  Above score calculated as 1 point each if present [CHF, HTN, DM, Vascular=MI/PAD/Aortic Plaque, Age if 37-74, or Male] Above  score calculated as 2 points each if present [Age > 75, or Stroke/TIA/TE]  5.  Obstructive sleep apnea: Compliance with prescribed therapy encouraged  COVID 19 screen The patient denies symptoms of COVID 19 at this time.  The importance of social distancing was discussed today.  This visit was done as a telephone visit.  The patient does not have access to a smart phone, and does not have a computer.  Follow-up:  1 year Next remote: 04/07/2019  Current medicines are reviewed at length with the patient today.   The patient does not have concerns regarding his medicines.  The following changes were made today:  none  Labs/ tests ordered today include:  No orders of the defined types were placed in this encounter.    Patient Risk:  after full review of this patients clinical status, I feel that they are at moderate risk at this time.  Today, I have spent 12 minutes with the patient with telehealth technology discussing heart failure.    Signed, Naasia Weilbacher Meredith Leeds, MD  02/17/2019 2:16 PM     [address] [place] [address] [phone] (office) [phone] (fax)

## 2019-02-18 ENCOUNTER — Ambulatory Visit: Payer: Medicare HMO | Admitting: Podiatry

## 2019-02-19 DIAGNOSIS — R296 Repeated falls: Secondary | ICD-10-CM | POA: Diagnosis not present

## 2019-02-19 DIAGNOSIS — Z0000 Encounter for general adult medical examination without abnormal findings: Secondary | ICD-10-CM | POA: Diagnosis not present

## 2019-02-19 DIAGNOSIS — Z1159 Encounter for screening for other viral diseases: Secondary | ICD-10-CM | POA: Diagnosis not present

## 2019-02-27 DIAGNOSIS — G910 Communicating hydrocephalus: Secondary | ICD-10-CM | POA: Diagnosis not present

## 2019-02-27 DIAGNOSIS — I480 Paroxysmal atrial fibrillation: Secondary | ICD-10-CM | POA: Diagnosis not present

## 2019-02-27 DIAGNOSIS — I5023 Acute on chronic systolic (congestive) heart failure: Secondary | ICD-10-CM | POA: Diagnosis not present

## 2019-02-27 DIAGNOSIS — Z9981 Dependence on supplemental oxygen: Secondary | ICD-10-CM | POA: Diagnosis not present

## 2019-02-27 DIAGNOSIS — G6181 Chronic inflammatory demyelinating polyneuritis: Secondary | ICD-10-CM | POA: Diagnosis not present

## 2019-02-27 DIAGNOSIS — I429 Cardiomyopathy, unspecified: Secondary | ICD-10-CM | POA: Diagnosis not present

## 2019-02-27 DIAGNOSIS — G4733 Obstructive sleep apnea (adult) (pediatric): Secondary | ICD-10-CM | POA: Diagnosis not present

## 2019-02-27 DIAGNOSIS — E1121 Type 2 diabetes mellitus with diabetic nephropathy: Secondary | ICD-10-CM | POA: Diagnosis not present

## 2019-02-27 DIAGNOSIS — I890 Lymphedema, not elsewhere classified: Secondary | ICD-10-CM | POA: Diagnosis not present

## 2019-02-27 DIAGNOSIS — Z982 Presence of cerebrospinal fluid drainage device: Secondary | ICD-10-CM | POA: Diagnosis not present

## 2019-02-27 DIAGNOSIS — M1611 Unilateral primary osteoarthritis, right hip: Secondary | ICD-10-CM | POA: Diagnosis not present

## 2019-02-27 DIAGNOSIS — Z8631 Personal history of diabetic foot ulcer: Secondary | ICD-10-CM | POA: Diagnosis not present

## 2019-02-27 DIAGNOSIS — I447 Left bundle-branch block, unspecified: Secondary | ICD-10-CM | POA: Diagnosis not present

## 2019-02-27 DIAGNOSIS — Z95810 Presence of automatic (implantable) cardiac defibrillator: Secondary | ICD-10-CM | POA: Diagnosis not present

## 2019-02-27 DIAGNOSIS — Z87891 Personal history of nicotine dependence: Secondary | ICD-10-CM | POA: Diagnosis not present

## 2019-02-27 DIAGNOSIS — Z89411 Acquired absence of right great toe: Secondary | ICD-10-CM | POA: Diagnosis not present

## 2019-02-27 DIAGNOSIS — E1142 Type 2 diabetes mellitus with diabetic polyneuropathy: Secondary | ICD-10-CM | POA: Diagnosis not present

## 2019-02-27 DIAGNOSIS — I110 Hypertensive heart disease with heart failure: Secondary | ICD-10-CM | POA: Diagnosis not present

## 2019-02-27 DIAGNOSIS — S91301A Unspecified open wound, right foot, initial encounter: Secondary | ICD-10-CM | POA: Diagnosis not present

## 2019-02-27 DIAGNOSIS — Z466 Encounter for fitting and adjustment of urinary device: Secondary | ICD-10-CM | POA: Diagnosis not present

## 2019-02-27 DIAGNOSIS — M7061 Trochanteric bursitis, right hip: Secondary | ICD-10-CM | POA: Diagnosis not present

## 2019-02-28 DIAGNOSIS — E782 Mixed hyperlipidemia: Secondary | ICD-10-CM | POA: Diagnosis not present

## 2019-02-28 DIAGNOSIS — E1142 Type 2 diabetes mellitus with diabetic polyneuropathy: Secondary | ICD-10-CM | POA: Diagnosis not present

## 2019-02-28 DIAGNOSIS — I4819 Other persistent atrial fibrillation: Secondary | ICD-10-CM | POA: Diagnosis not present

## 2019-02-28 DIAGNOSIS — G6181 Chronic inflammatory demyelinating polyneuritis: Secondary | ICD-10-CM | POA: Diagnosis not present

## 2019-03-03 DIAGNOSIS — M1611 Unilateral primary osteoarthritis, right hip: Secondary | ICD-10-CM | POA: Diagnosis not present

## 2019-03-03 DIAGNOSIS — Z9981 Dependence on supplemental oxygen: Secondary | ICD-10-CM | POA: Diagnosis not present

## 2019-03-03 DIAGNOSIS — I110 Hypertensive heart disease with heart failure: Secondary | ICD-10-CM | POA: Diagnosis not present

## 2019-03-03 DIAGNOSIS — G6181 Chronic inflammatory demyelinating polyneuritis: Secondary | ICD-10-CM | POA: Diagnosis not present

## 2019-03-03 DIAGNOSIS — M7061 Trochanteric bursitis, right hip: Secondary | ICD-10-CM | POA: Diagnosis not present

## 2019-03-03 DIAGNOSIS — S91301A Unspecified open wound, right foot, initial encounter: Secondary | ICD-10-CM | POA: Diagnosis not present

## 2019-03-03 DIAGNOSIS — Z95810 Presence of automatic (implantable) cardiac defibrillator: Secondary | ICD-10-CM | POA: Diagnosis not present

## 2019-03-03 DIAGNOSIS — E1121 Type 2 diabetes mellitus with diabetic nephropathy: Secondary | ICD-10-CM | POA: Diagnosis not present

## 2019-03-03 DIAGNOSIS — I5023 Acute on chronic systolic (congestive) heart failure: Secondary | ICD-10-CM | POA: Diagnosis not present

## 2019-03-03 DIAGNOSIS — Z87891 Personal history of nicotine dependence: Secondary | ICD-10-CM | POA: Diagnosis not present

## 2019-03-03 DIAGNOSIS — I447 Left bundle-branch block, unspecified: Secondary | ICD-10-CM | POA: Diagnosis not present

## 2019-03-03 DIAGNOSIS — I480 Paroxysmal atrial fibrillation: Secondary | ICD-10-CM | POA: Diagnosis not present

## 2019-03-03 DIAGNOSIS — G910 Communicating hydrocephalus: Secondary | ICD-10-CM | POA: Diagnosis not present

## 2019-03-03 DIAGNOSIS — Z466 Encounter for fitting and adjustment of urinary device: Secondary | ICD-10-CM | POA: Diagnosis not present

## 2019-03-03 DIAGNOSIS — Z8631 Personal history of diabetic foot ulcer: Secondary | ICD-10-CM | POA: Diagnosis not present

## 2019-03-03 DIAGNOSIS — I890 Lymphedema, not elsewhere classified: Secondary | ICD-10-CM | POA: Diagnosis not present

## 2019-03-03 DIAGNOSIS — Z982 Presence of cerebrospinal fluid drainage device: Secondary | ICD-10-CM | POA: Diagnosis not present

## 2019-03-03 DIAGNOSIS — I429 Cardiomyopathy, unspecified: Secondary | ICD-10-CM | POA: Diagnosis not present

## 2019-03-03 DIAGNOSIS — Z89411 Acquired absence of right great toe: Secondary | ICD-10-CM | POA: Diagnosis not present

## 2019-03-03 DIAGNOSIS — G4733 Obstructive sleep apnea (adult) (pediatric): Secondary | ICD-10-CM | POA: Diagnosis not present

## 2019-03-03 DIAGNOSIS — E1142 Type 2 diabetes mellitus with diabetic polyneuropathy: Secondary | ICD-10-CM | POA: Diagnosis not present

## 2019-03-11 ENCOUNTER — Telehealth: Payer: Self-pay | Admitting: Student

## 2019-03-11 DIAGNOSIS — Z982 Presence of cerebrospinal fluid drainage device: Secondary | ICD-10-CM | POA: Diagnosis not present

## 2019-03-11 DIAGNOSIS — I480 Paroxysmal atrial fibrillation: Secondary | ICD-10-CM | POA: Diagnosis not present

## 2019-03-11 DIAGNOSIS — E1142 Type 2 diabetes mellitus with diabetic polyneuropathy: Secondary | ICD-10-CM | POA: Diagnosis not present

## 2019-03-11 DIAGNOSIS — G6181 Chronic inflammatory demyelinating polyneuritis: Secondary | ICD-10-CM | POA: Diagnosis not present

## 2019-03-11 DIAGNOSIS — M1611 Unilateral primary osteoarthritis, right hip: Secondary | ICD-10-CM | POA: Diagnosis not present

## 2019-03-11 DIAGNOSIS — Z89411 Acquired absence of right great toe: Secondary | ICD-10-CM | POA: Diagnosis not present

## 2019-03-11 DIAGNOSIS — Z95810 Presence of automatic (implantable) cardiac defibrillator: Secondary | ICD-10-CM | POA: Diagnosis not present

## 2019-03-11 DIAGNOSIS — Z87891 Personal history of nicotine dependence: Secondary | ICD-10-CM | POA: Diagnosis not present

## 2019-03-11 DIAGNOSIS — S91301A Unspecified open wound, right foot, initial encounter: Secondary | ICD-10-CM | POA: Diagnosis not present

## 2019-03-11 DIAGNOSIS — I890 Lymphedema, not elsewhere classified: Secondary | ICD-10-CM | POA: Diagnosis not present

## 2019-03-11 DIAGNOSIS — Z8631 Personal history of diabetic foot ulcer: Secondary | ICD-10-CM | POA: Diagnosis not present

## 2019-03-11 DIAGNOSIS — G910 Communicating hydrocephalus: Secondary | ICD-10-CM | POA: Diagnosis not present

## 2019-03-11 DIAGNOSIS — G4733 Obstructive sleep apnea (adult) (pediatric): Secondary | ICD-10-CM | POA: Diagnosis not present

## 2019-03-11 DIAGNOSIS — I429 Cardiomyopathy, unspecified: Secondary | ICD-10-CM | POA: Diagnosis not present

## 2019-03-11 DIAGNOSIS — Z9981 Dependence on supplemental oxygen: Secondary | ICD-10-CM | POA: Diagnosis not present

## 2019-03-11 DIAGNOSIS — E1121 Type 2 diabetes mellitus with diabetic nephropathy: Secondary | ICD-10-CM | POA: Diagnosis not present

## 2019-03-11 DIAGNOSIS — I447 Left bundle-branch block, unspecified: Secondary | ICD-10-CM | POA: Diagnosis not present

## 2019-03-11 DIAGNOSIS — M7061 Trochanteric bursitis, right hip: Secondary | ICD-10-CM | POA: Diagnosis not present

## 2019-03-11 DIAGNOSIS — I110 Hypertensive heart disease with heart failure: Secondary | ICD-10-CM | POA: Diagnosis not present

## 2019-03-11 DIAGNOSIS — I5023 Acute on chronic systolic (congestive) heart failure: Secondary | ICD-10-CM | POA: Diagnosis not present

## 2019-03-11 DIAGNOSIS — Z466 Encounter for fitting and adjustment of urinary device: Secondary | ICD-10-CM | POA: Diagnosis not present

## 2019-03-11 NOTE — Telephone Encounter (Signed)
Spoke with pt caregiver, and due to relative immobility, he would very mch like to be enrolled in [facility] clinic.   Will forward information to Sharman Cheek, RN to schedule   Legrand Como "Oda Kilts, [place] 03/11/2019 3:28 PM

## 2019-03-13 DIAGNOSIS — Z9981 Dependence on supplemental oxygen: Secondary | ICD-10-CM | POA: Diagnosis not present

## 2019-03-13 DIAGNOSIS — S91301A Unspecified open wound, right foot, initial encounter: Secondary | ICD-10-CM | POA: Diagnosis not present

## 2019-03-13 DIAGNOSIS — Z95810 Presence of automatic (implantable) cardiac defibrillator: Secondary | ICD-10-CM | POA: Diagnosis not present

## 2019-03-13 DIAGNOSIS — Z982 Presence of cerebrospinal fluid drainage device: Secondary | ICD-10-CM | POA: Diagnosis not present

## 2019-03-13 DIAGNOSIS — Z89411 Acquired absence of right great toe: Secondary | ICD-10-CM | POA: Diagnosis not present

## 2019-03-13 DIAGNOSIS — I480 Paroxysmal atrial fibrillation: Secondary | ICD-10-CM | POA: Diagnosis not present

## 2019-03-13 DIAGNOSIS — I110 Hypertensive heart disease with heart failure: Secondary | ICD-10-CM | POA: Diagnosis not present

## 2019-03-13 DIAGNOSIS — G4733 Obstructive sleep apnea (adult) (pediatric): Secondary | ICD-10-CM | POA: Diagnosis not present

## 2019-03-13 DIAGNOSIS — G910 Communicating hydrocephalus: Secondary | ICD-10-CM | POA: Diagnosis not present

## 2019-03-13 DIAGNOSIS — I429 Cardiomyopathy, unspecified: Secondary | ICD-10-CM | POA: Diagnosis not present

## 2019-03-13 DIAGNOSIS — M7061 Trochanteric bursitis, right hip: Secondary | ICD-10-CM | POA: Diagnosis not present

## 2019-03-13 DIAGNOSIS — I890 Lymphedema, not elsewhere classified: Secondary | ICD-10-CM | POA: Diagnosis not present

## 2019-03-13 DIAGNOSIS — I447 Left bundle-branch block, unspecified: Secondary | ICD-10-CM | POA: Diagnosis not present

## 2019-03-13 DIAGNOSIS — E1142 Type 2 diabetes mellitus with diabetic polyneuropathy: Secondary | ICD-10-CM | POA: Diagnosis not present

## 2019-03-13 DIAGNOSIS — I5023 Acute on chronic systolic (congestive) heart failure: Secondary | ICD-10-CM | POA: Diagnosis not present

## 2019-03-13 DIAGNOSIS — Z8631 Personal history of diabetic foot ulcer: Secondary | ICD-10-CM | POA: Diagnosis not present

## 2019-03-13 DIAGNOSIS — E1121 Type 2 diabetes mellitus with diabetic nephropathy: Secondary | ICD-10-CM | POA: Diagnosis not present

## 2019-03-13 DIAGNOSIS — M1611 Unilateral primary osteoarthritis, right hip: Secondary | ICD-10-CM | POA: Diagnosis not present

## 2019-03-13 DIAGNOSIS — Z87891 Personal history of nicotine dependence: Secondary | ICD-10-CM | POA: Diagnosis not present

## 2019-03-13 DIAGNOSIS — Z466 Encounter for fitting and adjustment of urinary device: Secondary | ICD-10-CM | POA: Diagnosis not present

## 2019-03-13 DIAGNOSIS — G6181 Chronic inflammatory demyelinating polyneuritis: Secondary | ICD-10-CM | POA: Diagnosis not present

## 2019-03-18 DIAGNOSIS — I447 Left bundle-branch block, unspecified: Secondary | ICD-10-CM | POA: Diagnosis not present

## 2019-03-18 DIAGNOSIS — G910 Communicating hydrocephalus: Secondary | ICD-10-CM | POA: Diagnosis not present

## 2019-03-18 DIAGNOSIS — Z87891 Personal history of nicotine dependence: Secondary | ICD-10-CM | POA: Diagnosis not present

## 2019-03-18 DIAGNOSIS — Z95810 Presence of automatic (implantable) cardiac defibrillator: Secondary | ICD-10-CM | POA: Diagnosis not present

## 2019-03-18 DIAGNOSIS — I110 Hypertensive heart disease with heart failure: Secondary | ICD-10-CM | POA: Diagnosis not present

## 2019-03-18 DIAGNOSIS — G4733 Obstructive sleep apnea (adult) (pediatric): Secondary | ICD-10-CM | POA: Diagnosis not present

## 2019-03-18 DIAGNOSIS — Z982 Presence of cerebrospinal fluid drainage device: Secondary | ICD-10-CM | POA: Diagnosis not present

## 2019-03-18 DIAGNOSIS — I5023 Acute on chronic systolic (congestive) heart failure: Secondary | ICD-10-CM | POA: Diagnosis not present

## 2019-03-18 DIAGNOSIS — Z8631 Personal history of diabetic foot ulcer: Secondary | ICD-10-CM | POA: Diagnosis not present

## 2019-03-18 DIAGNOSIS — E1121 Type 2 diabetes mellitus with diabetic nephropathy: Secondary | ICD-10-CM | POA: Diagnosis not present

## 2019-03-18 DIAGNOSIS — I890 Lymphedema, not elsewhere classified: Secondary | ICD-10-CM | POA: Diagnosis not present

## 2019-03-18 DIAGNOSIS — M7061 Trochanteric bursitis, right hip: Secondary | ICD-10-CM | POA: Diagnosis not present

## 2019-03-18 DIAGNOSIS — Z466 Encounter for fitting and adjustment of urinary device: Secondary | ICD-10-CM | POA: Diagnosis not present

## 2019-03-18 DIAGNOSIS — Z9981 Dependence on supplemental oxygen: Secondary | ICD-10-CM | POA: Diagnosis not present

## 2019-03-18 DIAGNOSIS — Z89411 Acquired absence of right great toe: Secondary | ICD-10-CM | POA: Diagnosis not present

## 2019-03-18 DIAGNOSIS — E1142 Type 2 diabetes mellitus with diabetic polyneuropathy: Secondary | ICD-10-CM | POA: Diagnosis not present

## 2019-03-18 DIAGNOSIS — M1611 Unilateral primary osteoarthritis, right hip: Secondary | ICD-10-CM | POA: Diagnosis not present

## 2019-03-18 DIAGNOSIS — I429 Cardiomyopathy, unspecified: Secondary | ICD-10-CM | POA: Diagnosis not present

## 2019-03-18 DIAGNOSIS — S91301A Unspecified open wound, right foot, initial encounter: Secondary | ICD-10-CM | POA: Diagnosis not present

## 2019-03-18 DIAGNOSIS — G6181 Chronic inflammatory demyelinating polyneuritis: Secondary | ICD-10-CM | POA: Diagnosis not present

## 2019-03-18 DIAGNOSIS — I480 Paroxysmal atrial fibrillation: Secondary | ICD-10-CM | POA: Diagnosis not present

## 2019-03-19 DIAGNOSIS — Z89411 Acquired absence of right great toe: Secondary | ICD-10-CM | POA: Diagnosis not present

## 2019-03-19 DIAGNOSIS — Z95810 Presence of automatic (implantable) cardiac defibrillator: Secondary | ICD-10-CM | POA: Diagnosis not present

## 2019-03-19 DIAGNOSIS — E1142 Type 2 diabetes mellitus with diabetic polyneuropathy: Secondary | ICD-10-CM | POA: Diagnosis not present

## 2019-03-19 DIAGNOSIS — G910 Communicating hydrocephalus: Secondary | ICD-10-CM | POA: Diagnosis not present

## 2019-03-19 DIAGNOSIS — G4733 Obstructive sleep apnea (adult) (pediatric): Secondary | ICD-10-CM | POA: Diagnosis not present

## 2019-03-19 DIAGNOSIS — G6181 Chronic inflammatory demyelinating polyneuritis: Secondary | ICD-10-CM | POA: Diagnosis not present

## 2019-03-19 DIAGNOSIS — Z87891 Personal history of nicotine dependence: Secondary | ICD-10-CM | POA: Diagnosis not present

## 2019-03-19 DIAGNOSIS — I429 Cardiomyopathy, unspecified: Secondary | ICD-10-CM | POA: Diagnosis not present

## 2019-03-19 DIAGNOSIS — E1121 Type 2 diabetes mellitus with diabetic nephropathy: Secondary | ICD-10-CM | POA: Diagnosis not present

## 2019-03-19 DIAGNOSIS — Z982 Presence of cerebrospinal fluid drainage device: Secondary | ICD-10-CM | POA: Diagnosis not present

## 2019-03-19 DIAGNOSIS — Z9981 Dependence on supplemental oxygen: Secondary | ICD-10-CM | POA: Diagnosis not present

## 2019-03-19 DIAGNOSIS — I890 Lymphedema, not elsewhere classified: Secondary | ICD-10-CM | POA: Diagnosis not present

## 2019-03-19 DIAGNOSIS — S91301A Unspecified open wound, right foot, initial encounter: Secondary | ICD-10-CM | POA: Diagnosis not present

## 2019-03-19 DIAGNOSIS — M1611 Unilateral primary osteoarthritis, right hip: Secondary | ICD-10-CM | POA: Diagnosis not present

## 2019-03-19 DIAGNOSIS — I5023 Acute on chronic systolic (congestive) heart failure: Secondary | ICD-10-CM | POA: Diagnosis not present

## 2019-03-19 DIAGNOSIS — M7061 Trochanteric bursitis, right hip: Secondary | ICD-10-CM | POA: Diagnosis not present

## 2019-03-19 DIAGNOSIS — I447 Left bundle-branch block, unspecified: Secondary | ICD-10-CM | POA: Diagnosis not present

## 2019-03-19 DIAGNOSIS — I480 Paroxysmal atrial fibrillation: Secondary | ICD-10-CM | POA: Diagnosis not present

## 2019-03-19 DIAGNOSIS — Z8631 Personal history of diabetic foot ulcer: Secondary | ICD-10-CM | POA: Diagnosis not present

## 2019-03-19 DIAGNOSIS — I110 Hypertensive heart disease with heart failure: Secondary | ICD-10-CM | POA: Diagnosis not present

## 2019-03-19 DIAGNOSIS — Z466 Encounter for fitting and adjustment of urinary device: Secondary | ICD-10-CM | POA: Diagnosis not present

## 2019-03-20 DIAGNOSIS — I509 Heart failure, unspecified: Secondary | ICD-10-CM | POA: Diagnosis not present

## 2019-03-21 DIAGNOSIS — I429 Cardiomyopathy, unspecified: Secondary | ICD-10-CM | POA: Diagnosis not present

## 2019-03-21 DIAGNOSIS — Z87891 Personal history of nicotine dependence: Secondary | ICD-10-CM | POA: Diagnosis not present

## 2019-03-21 DIAGNOSIS — M7061 Trochanteric bursitis, right hip: Secondary | ICD-10-CM | POA: Diagnosis not present

## 2019-03-21 DIAGNOSIS — G6181 Chronic inflammatory demyelinating polyneuritis: Secondary | ICD-10-CM | POA: Diagnosis not present

## 2019-03-21 DIAGNOSIS — I890 Lymphedema, not elsewhere classified: Secondary | ICD-10-CM | POA: Diagnosis not present

## 2019-03-21 DIAGNOSIS — Z982 Presence of cerebrospinal fluid drainage device: Secondary | ICD-10-CM | POA: Diagnosis not present

## 2019-03-21 DIAGNOSIS — E1142 Type 2 diabetes mellitus with diabetic polyneuropathy: Secondary | ICD-10-CM | POA: Diagnosis not present

## 2019-03-21 DIAGNOSIS — I5023 Acute on chronic systolic (congestive) heart failure: Secondary | ICD-10-CM | POA: Diagnosis not present

## 2019-03-21 DIAGNOSIS — E1121 Type 2 diabetes mellitus with diabetic nephropathy: Secondary | ICD-10-CM | POA: Diagnosis not present

## 2019-03-21 DIAGNOSIS — Z9981 Dependence on supplemental oxygen: Secondary | ICD-10-CM | POA: Diagnosis not present

## 2019-03-21 DIAGNOSIS — Z95810 Presence of automatic (implantable) cardiac defibrillator: Secondary | ICD-10-CM | POA: Diagnosis not present

## 2019-03-21 DIAGNOSIS — I447 Left bundle-branch block, unspecified: Secondary | ICD-10-CM | POA: Diagnosis not present

## 2019-03-21 DIAGNOSIS — I110 Hypertensive heart disease with heart failure: Secondary | ICD-10-CM | POA: Diagnosis not present

## 2019-03-21 DIAGNOSIS — S91301A Unspecified open wound, right foot, initial encounter: Secondary | ICD-10-CM | POA: Diagnosis not present

## 2019-03-21 DIAGNOSIS — G4733 Obstructive sleep apnea (adult) (pediatric): Secondary | ICD-10-CM | POA: Diagnosis not present

## 2019-03-21 DIAGNOSIS — Z466 Encounter for fitting and adjustment of urinary device: Secondary | ICD-10-CM | POA: Diagnosis not present

## 2019-03-21 DIAGNOSIS — Z8631 Personal history of diabetic foot ulcer: Secondary | ICD-10-CM | POA: Diagnosis not present

## 2019-03-21 DIAGNOSIS — G910 Communicating hydrocephalus: Secondary | ICD-10-CM | POA: Diagnosis not present

## 2019-03-21 DIAGNOSIS — I480 Paroxysmal atrial fibrillation: Secondary | ICD-10-CM | POA: Diagnosis not present

## 2019-03-21 DIAGNOSIS — M1611 Unilateral primary osteoarthritis, right hip: Secondary | ICD-10-CM | POA: Diagnosis not present

## 2019-03-21 DIAGNOSIS — Z89411 Acquired absence of right great toe: Secondary | ICD-10-CM | POA: Diagnosis not present

## 2019-03-24 DIAGNOSIS — Z7982 Long term (current) use of aspirin: Secondary | ICD-10-CM | POA: Diagnosis not present

## 2019-03-24 DIAGNOSIS — R29898 Other symptoms and signs involving the musculoskeletal system: Secondary | ICD-10-CM | POA: Diagnosis not present

## 2019-03-24 DIAGNOSIS — I110 Hypertensive heart disease with heart failure: Secondary | ICD-10-CM | POA: Diagnosis not present

## 2019-03-24 DIAGNOSIS — Z9981 Dependence on supplemental oxygen: Secondary | ICD-10-CM | POA: Diagnosis not present

## 2019-03-24 DIAGNOSIS — I429 Cardiomyopathy, unspecified: Secondary | ICD-10-CM | POA: Diagnosis not present

## 2019-03-24 DIAGNOSIS — Z7901 Long term (current) use of anticoagulants: Secondary | ICD-10-CM | POA: Diagnosis not present

## 2019-03-24 DIAGNOSIS — Z79899 Other long term (current) drug therapy: Secondary | ICD-10-CM | POA: Diagnosis not present

## 2019-03-24 DIAGNOSIS — G910 Communicating hydrocephalus: Secondary | ICD-10-CM | POA: Diagnosis not present

## 2019-03-24 DIAGNOSIS — R5381 Other malaise: Secondary | ICD-10-CM | POA: Diagnosis not present

## 2019-03-24 DIAGNOSIS — Z794 Long term (current) use of insulin: Secondary | ICD-10-CM | POA: Diagnosis not present

## 2019-03-24 DIAGNOSIS — M1611 Unilateral primary osteoarthritis, right hip: Secondary | ICD-10-CM | POA: Diagnosis not present

## 2019-03-24 DIAGNOSIS — Z7409 Other reduced mobility: Secondary | ICD-10-CM | POA: Diagnosis not present

## 2019-03-24 DIAGNOSIS — Z982 Presence of cerebrospinal fluid drainage device: Secondary | ICD-10-CM | POA: Diagnosis not present

## 2019-03-24 DIAGNOSIS — S91301A Unspecified open wound, right foot, initial encounter: Secondary | ICD-10-CM | POA: Diagnosis not present

## 2019-03-24 DIAGNOSIS — G6181 Chronic inflammatory demyelinating polyneuritis: Secondary | ICD-10-CM | POA: Diagnosis not present

## 2019-03-24 DIAGNOSIS — I480 Paroxysmal atrial fibrillation: Secondary | ICD-10-CM | POA: Diagnosis not present

## 2019-03-24 DIAGNOSIS — I890 Lymphedema, not elsewhere classified: Secondary | ICD-10-CM | POA: Diagnosis not present

## 2019-03-24 DIAGNOSIS — E1142 Type 2 diabetes mellitus with diabetic polyneuropathy: Secondary | ICD-10-CM | POA: Diagnosis not present

## 2019-03-24 DIAGNOSIS — I447 Left bundle-branch block, unspecified: Secondary | ICD-10-CM | POA: Diagnosis not present

## 2019-03-24 DIAGNOSIS — I5023 Acute on chronic systolic (congestive) heart failure: Secondary | ICD-10-CM | POA: Diagnosis not present

## 2019-03-24 DIAGNOSIS — G4733 Obstructive sleep apnea (adult) (pediatric): Secondary | ICD-10-CM | POA: Diagnosis not present

## 2019-03-24 DIAGNOSIS — E1121 Type 2 diabetes mellitus with diabetic nephropathy: Secondary | ICD-10-CM | POA: Diagnosis not present

## 2019-03-26 DIAGNOSIS — I5023 Acute on chronic systolic (congestive) heart failure: Secondary | ICD-10-CM | POA: Diagnosis not present

## 2019-03-26 DIAGNOSIS — S91301A Unspecified open wound, right foot, initial encounter: Secondary | ICD-10-CM | POA: Diagnosis not present

## 2019-03-26 DIAGNOSIS — G4733 Obstructive sleep apnea (adult) (pediatric): Secondary | ICD-10-CM | POA: Diagnosis not present

## 2019-03-26 DIAGNOSIS — I480 Paroxysmal atrial fibrillation: Secondary | ICD-10-CM | POA: Diagnosis not present

## 2019-03-26 DIAGNOSIS — I429 Cardiomyopathy, unspecified: Secondary | ICD-10-CM | POA: Diagnosis not present

## 2019-03-26 DIAGNOSIS — Z79899 Other long term (current) drug therapy: Secondary | ICD-10-CM | POA: Diagnosis not present

## 2019-03-26 DIAGNOSIS — M1611 Unilateral primary osteoarthritis, right hip: Secondary | ICD-10-CM | POA: Diagnosis not present

## 2019-03-26 DIAGNOSIS — G6181 Chronic inflammatory demyelinating polyneuritis: Secondary | ICD-10-CM | POA: Diagnosis not present

## 2019-03-26 DIAGNOSIS — I890 Lymphedema, not elsewhere classified: Secondary | ICD-10-CM | POA: Diagnosis not present

## 2019-03-26 DIAGNOSIS — E1121 Type 2 diabetes mellitus with diabetic nephropathy: Secondary | ICD-10-CM | POA: Diagnosis not present

## 2019-03-26 DIAGNOSIS — R29898 Other symptoms and signs involving the musculoskeletal system: Secondary | ICD-10-CM | POA: Diagnosis not present

## 2019-03-26 DIAGNOSIS — Z7982 Long term (current) use of aspirin: Secondary | ICD-10-CM | POA: Diagnosis not present

## 2019-03-26 DIAGNOSIS — Z7409 Other reduced mobility: Secondary | ICD-10-CM | POA: Diagnosis not present

## 2019-03-26 DIAGNOSIS — Z9981 Dependence on supplemental oxygen: Secondary | ICD-10-CM | POA: Diagnosis not present

## 2019-03-26 DIAGNOSIS — E1142 Type 2 diabetes mellitus with diabetic polyneuropathy: Secondary | ICD-10-CM | POA: Diagnosis not present

## 2019-03-26 DIAGNOSIS — Z7901 Long term (current) use of anticoagulants: Secondary | ICD-10-CM | POA: Diagnosis not present

## 2019-03-26 DIAGNOSIS — Z794 Long term (current) use of insulin: Secondary | ICD-10-CM | POA: Diagnosis not present

## 2019-03-26 DIAGNOSIS — Z982 Presence of cerebrospinal fluid drainage device: Secondary | ICD-10-CM | POA: Diagnosis not present

## 2019-03-26 DIAGNOSIS — I110 Hypertensive heart disease with heart failure: Secondary | ICD-10-CM | POA: Diagnosis not present

## 2019-03-26 DIAGNOSIS — I447 Left bundle-branch block, unspecified: Secondary | ICD-10-CM | POA: Diagnosis not present

## 2019-03-26 DIAGNOSIS — G910 Communicating hydrocephalus: Secondary | ICD-10-CM | POA: Diagnosis not present

## 2019-03-26 DIAGNOSIS — R5381 Other malaise: Secondary | ICD-10-CM | POA: Diagnosis not present

## 2019-03-27 DIAGNOSIS — E1142 Type 2 diabetes mellitus with diabetic polyneuropathy: Secondary | ICD-10-CM | POA: Diagnosis not present

## 2019-03-27 DIAGNOSIS — Z7982 Long term (current) use of aspirin: Secondary | ICD-10-CM | POA: Diagnosis not present

## 2019-03-27 DIAGNOSIS — Z982 Presence of cerebrospinal fluid drainage device: Secondary | ICD-10-CM | POA: Diagnosis not present

## 2019-03-27 DIAGNOSIS — I447 Left bundle-branch block, unspecified: Secondary | ICD-10-CM | POA: Diagnosis not present

## 2019-03-27 DIAGNOSIS — R29898 Other symptoms and signs involving the musculoskeletal system: Secondary | ICD-10-CM | POA: Diagnosis not present

## 2019-03-27 DIAGNOSIS — Z7901 Long term (current) use of anticoagulants: Secondary | ICD-10-CM | POA: Diagnosis not present

## 2019-03-27 DIAGNOSIS — I110 Hypertensive heart disease with heart failure: Secondary | ICD-10-CM | POA: Diagnosis not present

## 2019-03-27 DIAGNOSIS — G910 Communicating hydrocephalus: Secondary | ICD-10-CM | POA: Diagnosis not present

## 2019-03-27 DIAGNOSIS — Z7409 Other reduced mobility: Secondary | ICD-10-CM | POA: Diagnosis not present

## 2019-03-27 DIAGNOSIS — G4733 Obstructive sleep apnea (adult) (pediatric): Secondary | ICD-10-CM | POA: Diagnosis not present

## 2019-03-27 DIAGNOSIS — I5023 Acute on chronic systolic (congestive) heart failure: Secondary | ICD-10-CM | POA: Diagnosis not present

## 2019-03-27 DIAGNOSIS — Z794 Long term (current) use of insulin: Secondary | ICD-10-CM | POA: Diagnosis not present

## 2019-03-27 DIAGNOSIS — Z79899 Other long term (current) drug therapy: Secondary | ICD-10-CM | POA: Diagnosis not present

## 2019-03-27 DIAGNOSIS — M1611 Unilateral primary osteoarthritis, right hip: Secondary | ICD-10-CM | POA: Diagnosis not present

## 2019-03-27 DIAGNOSIS — E1121 Type 2 diabetes mellitus with diabetic nephropathy: Secondary | ICD-10-CM | POA: Diagnosis not present

## 2019-03-27 DIAGNOSIS — Z9981 Dependence on supplemental oxygen: Secondary | ICD-10-CM | POA: Diagnosis not present

## 2019-03-27 DIAGNOSIS — I429 Cardiomyopathy, unspecified: Secondary | ICD-10-CM | POA: Diagnosis not present

## 2019-03-27 DIAGNOSIS — S91301A Unspecified open wound, right foot, initial encounter: Secondary | ICD-10-CM | POA: Diagnosis not present

## 2019-03-27 DIAGNOSIS — G6181 Chronic inflammatory demyelinating polyneuritis: Secondary | ICD-10-CM | POA: Diagnosis not present

## 2019-03-27 DIAGNOSIS — R5381 Other malaise: Secondary | ICD-10-CM | POA: Diagnosis not present

## 2019-03-27 DIAGNOSIS — I890 Lymphedema, not elsewhere classified: Secondary | ICD-10-CM | POA: Diagnosis not present

## 2019-03-27 DIAGNOSIS — I480 Paroxysmal atrial fibrillation: Secondary | ICD-10-CM | POA: Diagnosis not present

## 2019-03-29 DIAGNOSIS — I498 Other specified cardiac arrhythmias: Secondary | ICD-10-CM | POA: Diagnosis not present

## 2019-03-29 DIAGNOSIS — N179 Acute kidney failure, unspecified: Secondary | ICD-10-CM | POA: Diagnosis not present

## 2019-03-29 DIAGNOSIS — T68XXXA Hypothermia, initial encounter: Secondary | ICD-10-CM | POA: Diagnosis not present

## 2019-03-29 DIAGNOSIS — I959 Hypotension, unspecified: Secondary | ICD-10-CM | POA: Diagnosis not present

## 2019-03-29 DIAGNOSIS — R61 Generalized hyperhidrosis: Secondary | ICD-10-CM | POA: Diagnosis not present

## 2019-03-29 DIAGNOSIS — R531 Weakness: Secondary | ICD-10-CM | POA: Diagnosis not present

## 2019-03-29 DIAGNOSIS — S80812A Abrasion, left lower leg, initial encounter: Secondary | ICD-10-CM | POA: Diagnosis not present

## 2019-03-30 DIAGNOSIS — G4733 Obstructive sleep apnea (adult) (pediatric): Secondary | ICD-10-CM | POA: Diagnosis not present

## 2019-03-30 DIAGNOSIS — I5022 Chronic systolic (congestive) heart failure: Secondary | ICD-10-CM | POA: Diagnosis not present

## 2019-03-30 DIAGNOSIS — Z950 Presence of cardiac pacemaker: Secondary | ICD-10-CM | POA: Diagnosis not present

## 2019-03-30 DIAGNOSIS — G911 Obstructive hydrocephalus: Secondary | ICD-10-CM | POA: Diagnosis not present

## 2019-03-30 DIAGNOSIS — I110 Hypertensive heart disease with heart failure: Secondary | ICD-10-CM | POA: Diagnosis not present

## 2019-03-30 DIAGNOSIS — Z86711 Personal history of pulmonary embolism: Secondary | ICD-10-CM | POA: Diagnosis not present

## 2019-03-30 DIAGNOSIS — I480 Paroxysmal atrial fibrillation: Secondary | ICD-10-CM | POA: Diagnosis not present

## 2019-03-30 DIAGNOSIS — I498 Other specified cardiac arrhythmias: Secondary | ICD-10-CM | POA: Diagnosis not present

## 2019-03-30 DIAGNOSIS — Z9119 Patient's noncompliance with other medical treatment and regimen: Secondary | ICD-10-CM | POA: Diagnosis not present

## 2019-03-30 DIAGNOSIS — I739 Peripheral vascular disease, unspecified: Secondary | ICD-10-CM | POA: Diagnosis not present

## 2019-03-30 DIAGNOSIS — G6181 Chronic inflammatory demyelinating polyneuritis: Secondary | ICD-10-CM | POA: Diagnosis not present

## 2019-03-30 DIAGNOSIS — N179 Acute kidney failure, unspecified: Secondary | ICD-10-CM | POA: Diagnosis not present

## 2019-03-30 DIAGNOSIS — E1142 Type 2 diabetes mellitus with diabetic polyneuropathy: Secondary | ICD-10-CM | POA: Diagnosis not present

## 2019-03-30 DIAGNOSIS — E785 Hyperlipidemia, unspecified: Secondary | ICD-10-CM | POA: Diagnosis not present

## 2019-03-30 DIAGNOSIS — S80812A Abrasion, left lower leg, initial encounter: Secondary | ICD-10-CM | POA: Diagnosis not present

## 2019-03-30 DIAGNOSIS — R531 Weakness: Secondary | ICD-10-CM | POA: Diagnosis not present

## 2019-03-30 DIAGNOSIS — Z1159 Encounter for screening for other viral diseases: Secondary | ICD-10-CM | POA: Diagnosis not present

## 2019-03-30 DIAGNOSIS — M545 Low back pain: Secondary | ICD-10-CM | POA: Diagnosis not present

## 2019-03-30 DIAGNOSIS — Z741 Need for assistance with personal care: Secondary | ICD-10-CM | POA: Diagnosis not present

## 2019-03-30 DIAGNOSIS — R5383 Other fatigue: Secondary | ICD-10-CM | POA: Diagnosis not present

## 2019-03-30 DIAGNOSIS — Z7409 Other reduced mobility: Secondary | ICD-10-CM | POA: Diagnosis not present

## 2019-03-30 DIAGNOSIS — S81811A Laceration without foreign body, right lower leg, initial encounter: Secondary | ICD-10-CM | POA: Diagnosis not present

## 2019-03-30 DIAGNOSIS — Z982 Presence of cerebrospinal fluid drainage device: Secondary | ICD-10-CM | POA: Diagnosis not present

## 2019-03-30 DIAGNOSIS — D72829 Elevated white blood cell count, unspecified: Secondary | ICD-10-CM | POA: Diagnosis not present

## 2019-03-30 DIAGNOSIS — Z993 Dependence on wheelchair: Secondary | ICD-10-CM | POA: Diagnosis not present

## 2019-03-30 DIAGNOSIS — R29898 Other symptoms and signs involving the musculoskeletal system: Secondary | ICD-10-CM | POA: Diagnosis not present

## 2019-03-30 DIAGNOSIS — G8929 Other chronic pain: Secondary | ICD-10-CM | POA: Diagnosis not present

## 2019-03-30 DIAGNOSIS — R5381 Other malaise: Secondary | ICD-10-CM | POA: Diagnosis not present

## 2019-03-31 DIAGNOSIS — I5022 Chronic systolic (congestive) heart failure: Secondary | ICD-10-CM | POA: Diagnosis not present

## 2019-03-31 DIAGNOSIS — M545 Low back pain: Secondary | ICD-10-CM | POA: Diagnosis not present

## 2019-03-31 DIAGNOSIS — G6181 Chronic inflammatory demyelinating polyneuritis: Secondary | ICD-10-CM | POA: Diagnosis not present

## 2019-03-31 DIAGNOSIS — S91301A Unspecified open wound, right foot, initial encounter: Secondary | ICD-10-CM | POA: Diagnosis not present

## 2019-03-31 DIAGNOSIS — R531 Weakness: Secondary | ICD-10-CM | POA: Diagnosis not present

## 2019-03-31 DIAGNOSIS — I480 Paroxysmal atrial fibrillation: Secondary | ICD-10-CM | POA: Diagnosis not present

## 2019-04-01 DIAGNOSIS — I5022 Chronic systolic (congestive) heart failure: Secondary | ICD-10-CM | POA: Diagnosis not present

## 2019-04-01 DIAGNOSIS — R531 Weakness: Secondary | ICD-10-CM | POA: Diagnosis not present

## 2019-04-01 DIAGNOSIS — G6181 Chronic inflammatory demyelinating polyneuritis: Secondary | ICD-10-CM | POA: Diagnosis not present

## 2019-04-01 DIAGNOSIS — I480 Paroxysmal atrial fibrillation: Secondary | ICD-10-CM | POA: Diagnosis not present

## 2019-04-01 DIAGNOSIS — M545 Low back pain: Secondary | ICD-10-CM | POA: Diagnosis not present

## 2019-04-02 DIAGNOSIS — G6181 Chronic inflammatory demyelinating polyneuritis: Secondary | ICD-10-CM | POA: Diagnosis not present

## 2019-04-02 DIAGNOSIS — Z743 Need for continuous supervision: Secondary | ICD-10-CM | POA: Diagnosis not present

## 2019-04-02 DIAGNOSIS — I480 Paroxysmal atrial fibrillation: Secondary | ICD-10-CM | POA: Diagnosis not present

## 2019-04-02 DIAGNOSIS — G8929 Other chronic pain: Secondary | ICD-10-CM | POA: Diagnosis not present

## 2019-04-02 DIAGNOSIS — Z7409 Other reduced mobility: Secondary | ICD-10-CM | POA: Diagnosis not present

## 2019-04-02 DIAGNOSIS — R29898 Other symptoms and signs involving the musculoskeletal system: Secondary | ICD-10-CM | POA: Diagnosis not present

## 2019-04-03 DIAGNOSIS — E1121 Type 2 diabetes mellitus with diabetic nephropathy: Secondary | ICD-10-CM | POA: Diagnosis not present

## 2019-04-03 DIAGNOSIS — I890 Lymphedema, not elsewhere classified: Secondary | ICD-10-CM | POA: Diagnosis not present

## 2019-04-03 DIAGNOSIS — Z7901 Long term (current) use of anticoagulants: Secondary | ICD-10-CM | POA: Diagnosis not present

## 2019-04-03 DIAGNOSIS — E1142 Type 2 diabetes mellitus with diabetic polyneuropathy: Secondary | ICD-10-CM | POA: Diagnosis not present

## 2019-04-03 DIAGNOSIS — R5381 Other malaise: Secondary | ICD-10-CM | POA: Diagnosis not present

## 2019-04-03 DIAGNOSIS — G6181 Chronic inflammatory demyelinating polyneuritis: Secondary | ICD-10-CM | POA: Diagnosis not present

## 2019-04-03 DIAGNOSIS — Z9981 Dependence on supplemental oxygen: Secondary | ICD-10-CM | POA: Diagnosis not present

## 2019-04-03 DIAGNOSIS — I447 Left bundle-branch block, unspecified: Secondary | ICD-10-CM | POA: Diagnosis not present

## 2019-04-03 DIAGNOSIS — M1611 Unilateral primary osteoarthritis, right hip: Secondary | ICD-10-CM | POA: Diagnosis not present

## 2019-04-03 DIAGNOSIS — Z7409 Other reduced mobility: Secondary | ICD-10-CM | POA: Diagnosis not present

## 2019-04-03 DIAGNOSIS — G910 Communicating hydrocephalus: Secondary | ICD-10-CM | POA: Diagnosis not present

## 2019-04-03 DIAGNOSIS — G4733 Obstructive sleep apnea (adult) (pediatric): Secondary | ICD-10-CM | POA: Diagnosis not present

## 2019-04-03 DIAGNOSIS — S91301A Unspecified open wound, right foot, initial encounter: Secondary | ICD-10-CM | POA: Diagnosis not present

## 2019-04-03 DIAGNOSIS — I110 Hypertensive heart disease with heart failure: Secondary | ICD-10-CM | POA: Diagnosis not present

## 2019-04-03 DIAGNOSIS — R29898 Other symptoms and signs involving the musculoskeletal system: Secondary | ICD-10-CM | POA: Diagnosis not present

## 2019-04-03 DIAGNOSIS — I429 Cardiomyopathy, unspecified: Secondary | ICD-10-CM | POA: Diagnosis not present

## 2019-04-03 DIAGNOSIS — Z982 Presence of cerebrospinal fluid drainage device: Secondary | ICD-10-CM | POA: Diagnosis not present

## 2019-04-03 DIAGNOSIS — I5023 Acute on chronic systolic (congestive) heart failure: Secondary | ICD-10-CM | POA: Diagnosis not present

## 2019-04-03 DIAGNOSIS — Z794 Long term (current) use of insulin: Secondary | ICD-10-CM | POA: Diagnosis not present

## 2019-04-03 DIAGNOSIS — Z79899 Other long term (current) drug therapy: Secondary | ICD-10-CM | POA: Diagnosis not present

## 2019-04-03 DIAGNOSIS — I480 Paroxysmal atrial fibrillation: Secondary | ICD-10-CM | POA: Diagnosis not present

## 2019-04-03 DIAGNOSIS — Z7982 Long term (current) use of aspirin: Secondary | ICD-10-CM | POA: Diagnosis not present

## 2019-04-07 ENCOUNTER — Ambulatory Visit (INDEPENDENT_AMBULATORY_CARE_PROVIDER_SITE_OTHER): Payer: Medicare Other | Admitting: *Deleted

## 2019-04-07 DIAGNOSIS — I5022 Chronic systolic (congestive) heart failure: Secondary | ICD-10-CM

## 2019-04-07 LAB — CUP PACEART REMOTE DEVICE CHECK
Battery Remaining Longevity: 102 mo
Battery Remaining Percentage: 100 %
Brady Statistic RA Percent Paced: 82 %
Brady Statistic RV Percent Paced: 100 %
Date Time Interrogation Session: 20200615075000
HighPow Impedance: 79 Ohm
Implantable Lead Implant Date: 20191118
Implantable Lead Implant Date: 20191118
Implantable Lead Implant Date: 20191118
Implantable Lead Location: 753858
Implantable Lead Location: 753859
Implantable Lead Location: 753860
Implantable Lead Model: 293
Implantable Lead Model: 4674
Implantable Lead Model: 7741
Implantable Lead Serial Number: 1025951
Implantable Lead Serial Number: 442535
Implantable Lead Serial Number: 833002
Implantable Pulse Generator Implant Date: 20191118
Lead Channel Impedance Value: 437 Ohm
Lead Channel Impedance Value: 617 Ohm
Lead Channel Impedance Value: 766 Ohm
Lead Channel Setting Pacing Amplitude: 3.5 V
Lead Channel Setting Pacing Amplitude: 3.5 V
Lead Channel Setting Pacing Amplitude: 3.5 V
Lead Channel Setting Pacing Pulse Width: 0.4 ms
Lead Channel Setting Pacing Pulse Width: 0.4 ms
Lead Channel Setting Sensing Sensitivity: 0.5 mV
Lead Channel Setting Sensing Sensitivity: 1 mV
Pulse Gen Serial Number: 219271

## 2019-04-08 ENCOUNTER — Ambulatory Visit (INDEPENDENT_AMBULATORY_CARE_PROVIDER_SITE_OTHER): Payer: Medicare Other

## 2019-04-08 DIAGNOSIS — I5022 Chronic systolic (congestive) heart failure: Secondary | ICD-10-CM

## 2019-04-08 DIAGNOSIS — Z95810 Presence of automatic (implantable) cardiac defibrillator: Secondary | ICD-10-CM

## 2019-04-10 DIAGNOSIS — Z7982 Long term (current) use of aspirin: Secondary | ICD-10-CM | POA: Diagnosis not present

## 2019-04-10 DIAGNOSIS — Z79899 Other long term (current) drug therapy: Secondary | ICD-10-CM | POA: Diagnosis not present

## 2019-04-10 DIAGNOSIS — Z982 Presence of cerebrospinal fluid drainage device: Secondary | ICD-10-CM | POA: Diagnosis not present

## 2019-04-10 DIAGNOSIS — E1142 Type 2 diabetes mellitus with diabetic polyneuropathy: Secondary | ICD-10-CM | POA: Diagnosis not present

## 2019-04-10 DIAGNOSIS — I5023 Acute on chronic systolic (congestive) heart failure: Secondary | ICD-10-CM | POA: Diagnosis not present

## 2019-04-10 DIAGNOSIS — S91301A Unspecified open wound, right foot, initial encounter: Secondary | ICD-10-CM | POA: Diagnosis not present

## 2019-04-10 DIAGNOSIS — I447 Left bundle-branch block, unspecified: Secondary | ICD-10-CM | POA: Diagnosis not present

## 2019-04-10 DIAGNOSIS — R5381 Other malaise: Secondary | ICD-10-CM | POA: Diagnosis not present

## 2019-04-10 DIAGNOSIS — I480 Paroxysmal atrial fibrillation: Secondary | ICD-10-CM | POA: Diagnosis not present

## 2019-04-10 DIAGNOSIS — Z7409 Other reduced mobility: Secondary | ICD-10-CM | POA: Diagnosis not present

## 2019-04-10 DIAGNOSIS — Z794 Long term (current) use of insulin: Secondary | ICD-10-CM | POA: Diagnosis not present

## 2019-04-10 DIAGNOSIS — I890 Lymphedema, not elsewhere classified: Secondary | ICD-10-CM | POA: Diagnosis not present

## 2019-04-10 DIAGNOSIS — M1611 Unilateral primary osteoarthritis, right hip: Secondary | ICD-10-CM | POA: Diagnosis not present

## 2019-04-10 DIAGNOSIS — G4733 Obstructive sleep apnea (adult) (pediatric): Secondary | ICD-10-CM | POA: Diagnosis not present

## 2019-04-10 DIAGNOSIS — I110 Hypertensive heart disease with heart failure: Secondary | ICD-10-CM | POA: Diagnosis not present

## 2019-04-10 DIAGNOSIS — G910 Communicating hydrocephalus: Secondary | ICD-10-CM | POA: Diagnosis not present

## 2019-04-10 DIAGNOSIS — E1121 Type 2 diabetes mellitus with diabetic nephropathy: Secondary | ICD-10-CM | POA: Diagnosis not present

## 2019-04-10 DIAGNOSIS — G6181 Chronic inflammatory demyelinating polyneuritis: Secondary | ICD-10-CM | POA: Diagnosis not present

## 2019-04-10 DIAGNOSIS — R29898 Other symptoms and signs involving the musculoskeletal system: Secondary | ICD-10-CM | POA: Diagnosis not present

## 2019-04-10 DIAGNOSIS — Z7901 Long term (current) use of anticoagulants: Secondary | ICD-10-CM | POA: Diagnosis not present

## 2019-04-10 DIAGNOSIS — I429 Cardiomyopathy, unspecified: Secondary | ICD-10-CM | POA: Diagnosis not present

## 2019-04-10 DIAGNOSIS — Z9981 Dependence on supplemental oxygen: Secondary | ICD-10-CM | POA: Diagnosis not present

## 2019-04-11 ENCOUNTER — Telehealth: Payer: Self-pay

## 2019-04-11 DIAGNOSIS — Z7982 Long term (current) use of aspirin: Secondary | ICD-10-CM | POA: Diagnosis not present

## 2019-04-11 DIAGNOSIS — E1142 Type 2 diabetes mellitus with diabetic polyneuropathy: Secondary | ICD-10-CM | POA: Diagnosis not present

## 2019-04-11 DIAGNOSIS — G6181 Chronic inflammatory demyelinating polyneuritis: Secondary | ICD-10-CM | POA: Diagnosis not present

## 2019-04-11 DIAGNOSIS — Z79899 Other long term (current) drug therapy: Secondary | ICD-10-CM | POA: Diagnosis not present

## 2019-04-11 DIAGNOSIS — Z7901 Long term (current) use of anticoagulants: Secondary | ICD-10-CM | POA: Diagnosis not present

## 2019-04-11 DIAGNOSIS — G910 Communicating hydrocephalus: Secondary | ICD-10-CM | POA: Diagnosis not present

## 2019-04-11 DIAGNOSIS — E1121 Type 2 diabetes mellitus with diabetic nephropathy: Secondary | ICD-10-CM | POA: Diagnosis not present

## 2019-04-11 DIAGNOSIS — R5381 Other malaise: Secondary | ICD-10-CM | POA: Diagnosis not present

## 2019-04-11 DIAGNOSIS — Z7409 Other reduced mobility: Secondary | ICD-10-CM | POA: Diagnosis not present

## 2019-04-11 DIAGNOSIS — S91301A Unspecified open wound, right foot, initial encounter: Secondary | ICD-10-CM | POA: Diagnosis not present

## 2019-04-11 DIAGNOSIS — I447 Left bundle-branch block, unspecified: Secondary | ICD-10-CM | POA: Diagnosis not present

## 2019-04-11 DIAGNOSIS — I110 Hypertensive heart disease with heart failure: Secondary | ICD-10-CM | POA: Diagnosis not present

## 2019-04-11 DIAGNOSIS — I5023 Acute on chronic systolic (congestive) heart failure: Secondary | ICD-10-CM | POA: Diagnosis not present

## 2019-04-11 DIAGNOSIS — Z794 Long term (current) use of insulin: Secondary | ICD-10-CM | POA: Diagnosis not present

## 2019-04-11 DIAGNOSIS — I480 Paroxysmal atrial fibrillation: Secondary | ICD-10-CM | POA: Diagnosis not present

## 2019-04-11 DIAGNOSIS — Z982 Presence of cerebrospinal fluid drainage device: Secondary | ICD-10-CM | POA: Diagnosis not present

## 2019-04-11 DIAGNOSIS — R29898 Other symptoms and signs involving the musculoskeletal system: Secondary | ICD-10-CM | POA: Diagnosis not present

## 2019-04-11 DIAGNOSIS — I890 Lymphedema, not elsewhere classified: Secondary | ICD-10-CM | POA: Diagnosis not present

## 2019-04-11 DIAGNOSIS — M1611 Unilateral primary osteoarthritis, right hip: Secondary | ICD-10-CM | POA: Diagnosis not present

## 2019-04-11 DIAGNOSIS — I429 Cardiomyopathy, unspecified: Secondary | ICD-10-CM | POA: Diagnosis not present

## 2019-04-11 DIAGNOSIS — Z9981 Dependence on supplemental oxygen: Secondary | ICD-10-CM | POA: Diagnosis not present

## 2019-04-11 DIAGNOSIS — G4733 Obstructive sleep apnea (adult) (pediatric): Secondary | ICD-10-CM | POA: Diagnosis not present

## 2019-04-11 NOTE — Telephone Encounter (Signed)
Remote ICM transmission received.  Attempted call to patient regarding ICM remote transmission and left message, per DPR, to return call.    

## 2019-04-11 NOTE — Progress Notes (Signed)
EPIC Encounter for ICM Monitoring  Patient Name: Johnathan Wright is a 73 y.o. male Date: 04/11/2019 Primary Care Physican: Rochel Brome, MD Primary Cardiologist: [facility] Electrophysiologist: Curt Bears 04/11/2019 Weight: unknown Right Ventricular:  100% Left Ventricular:    100%        1st ICM remote transmission. Attempted call to patient and unable to reach.  Left message to return call. Transmission reviewed.  New DPR needed.   04/07/2019 HeartLogic Heart Failure Index is 11 suggesting fluid accumulation which is within normal threshold range.  Prescribed: Furosemide 20 mg take 1 tablet daily  Recommendations: Unable to reach.    Follow-up plan: ICM clinic phone appointment on 05/19/2019.         Copy of ICM check sent to Dr. Curt Bears.     8 Day Data Trend           Rosalene Billings, RN 04/11/2019 11:36 AM

## 2019-04-14 DIAGNOSIS — R531 Weakness: Secondary | ICD-10-CM | POA: Diagnosis not present

## 2019-04-14 DIAGNOSIS — M6281 Muscle weakness (generalized): Secondary | ICD-10-CM | POA: Diagnosis not present

## 2019-04-15 ENCOUNTER — Encounter: Payer: Self-pay | Admitting: Cardiology

## 2019-04-15 NOTE — Progress Notes (Signed)
Remote ICD transmission.   

## 2019-04-16 DIAGNOSIS — Z7409 Other reduced mobility: Secondary | ICD-10-CM | POA: Diagnosis not present

## 2019-04-16 DIAGNOSIS — E1142 Type 2 diabetes mellitus with diabetic polyneuropathy: Secondary | ICD-10-CM | POA: Diagnosis not present

## 2019-04-16 DIAGNOSIS — I5023 Acute on chronic systolic (congestive) heart failure: Secondary | ICD-10-CM | POA: Diagnosis not present

## 2019-04-16 DIAGNOSIS — E1121 Type 2 diabetes mellitus with diabetic nephropathy: Secondary | ICD-10-CM | POA: Diagnosis not present

## 2019-04-16 DIAGNOSIS — I429 Cardiomyopathy, unspecified: Secondary | ICD-10-CM | POA: Diagnosis not present

## 2019-04-16 DIAGNOSIS — Z7982 Long term (current) use of aspirin: Secondary | ICD-10-CM | POA: Diagnosis not present

## 2019-04-16 DIAGNOSIS — I447 Left bundle-branch block, unspecified: Secondary | ICD-10-CM | POA: Diagnosis not present

## 2019-04-16 DIAGNOSIS — M1611 Unilateral primary osteoarthritis, right hip: Secondary | ICD-10-CM | POA: Diagnosis not present

## 2019-04-16 DIAGNOSIS — Z794 Long term (current) use of insulin: Secondary | ICD-10-CM | POA: Diagnosis not present

## 2019-04-16 DIAGNOSIS — S91301A Unspecified open wound, right foot, initial encounter: Secondary | ICD-10-CM | POA: Diagnosis not present

## 2019-04-16 DIAGNOSIS — R5381 Other malaise: Secondary | ICD-10-CM | POA: Diagnosis not present

## 2019-04-16 DIAGNOSIS — G4733 Obstructive sleep apnea (adult) (pediatric): Secondary | ICD-10-CM | POA: Diagnosis not present

## 2019-04-16 DIAGNOSIS — I480 Paroxysmal atrial fibrillation: Secondary | ICD-10-CM | POA: Diagnosis not present

## 2019-04-16 DIAGNOSIS — I890 Lymphedema, not elsewhere classified: Secondary | ICD-10-CM | POA: Diagnosis not present

## 2019-04-16 DIAGNOSIS — Z9981 Dependence on supplemental oxygen: Secondary | ICD-10-CM | POA: Diagnosis not present

## 2019-04-16 DIAGNOSIS — R29898 Other symptoms and signs involving the musculoskeletal system: Secondary | ICD-10-CM | POA: Diagnosis not present

## 2019-04-16 DIAGNOSIS — Z982 Presence of cerebrospinal fluid drainage device: Secondary | ICD-10-CM | POA: Diagnosis not present

## 2019-04-16 DIAGNOSIS — Z79899 Other long term (current) drug therapy: Secondary | ICD-10-CM | POA: Diagnosis not present

## 2019-04-16 DIAGNOSIS — Z7901 Long term (current) use of anticoagulants: Secondary | ICD-10-CM | POA: Diagnosis not present

## 2019-04-16 DIAGNOSIS — I110 Hypertensive heart disease with heart failure: Secondary | ICD-10-CM | POA: Diagnosis not present

## 2019-04-16 DIAGNOSIS — G6181 Chronic inflammatory demyelinating polyneuritis: Secondary | ICD-10-CM | POA: Diagnosis not present

## 2019-04-16 DIAGNOSIS — G910 Communicating hydrocephalus: Secondary | ICD-10-CM | POA: Diagnosis not present

## 2019-04-17 DIAGNOSIS — I110 Hypertensive heart disease with heart failure: Secondary | ICD-10-CM | POA: Diagnosis not present

## 2019-04-17 DIAGNOSIS — I429 Cardiomyopathy, unspecified: Secondary | ICD-10-CM | POA: Diagnosis not present

## 2019-04-17 DIAGNOSIS — I890 Lymphedema, not elsewhere classified: Secondary | ICD-10-CM | POA: Diagnosis not present

## 2019-04-17 DIAGNOSIS — M1611 Unilateral primary osteoarthritis, right hip: Secondary | ICD-10-CM | POA: Diagnosis not present

## 2019-04-17 DIAGNOSIS — G910 Communicating hydrocephalus: Secondary | ICD-10-CM | POA: Diagnosis not present

## 2019-04-17 DIAGNOSIS — Z982 Presence of cerebrospinal fluid drainage device: Secondary | ICD-10-CM | POA: Diagnosis not present

## 2019-04-17 DIAGNOSIS — I480 Paroxysmal atrial fibrillation: Secondary | ICD-10-CM | POA: Diagnosis not present

## 2019-04-17 DIAGNOSIS — S91301A Unspecified open wound, right foot, initial encounter: Secondary | ICD-10-CM | POA: Diagnosis not present

## 2019-04-17 DIAGNOSIS — G4733 Obstructive sleep apnea (adult) (pediatric): Secondary | ICD-10-CM | POA: Diagnosis not present

## 2019-04-17 DIAGNOSIS — Z79899 Other long term (current) drug therapy: Secondary | ICD-10-CM | POA: Diagnosis not present

## 2019-04-17 DIAGNOSIS — Z7409 Other reduced mobility: Secondary | ICD-10-CM | POA: Diagnosis not present

## 2019-04-17 DIAGNOSIS — I447 Left bundle-branch block, unspecified: Secondary | ICD-10-CM | POA: Diagnosis not present

## 2019-04-17 DIAGNOSIS — R29898 Other symptoms and signs involving the musculoskeletal system: Secondary | ICD-10-CM | POA: Diagnosis not present

## 2019-04-17 DIAGNOSIS — I5023 Acute on chronic systolic (congestive) heart failure: Secondary | ICD-10-CM | POA: Diagnosis not present

## 2019-04-17 DIAGNOSIS — E1142 Type 2 diabetes mellitus with diabetic polyneuropathy: Secondary | ICD-10-CM | POA: Diagnosis not present

## 2019-04-17 DIAGNOSIS — G6181 Chronic inflammatory demyelinating polyneuritis: Secondary | ICD-10-CM | POA: Diagnosis not present

## 2019-04-17 DIAGNOSIS — Z7901 Long term (current) use of anticoagulants: Secondary | ICD-10-CM | POA: Diagnosis not present

## 2019-04-17 DIAGNOSIS — Z7982 Long term (current) use of aspirin: Secondary | ICD-10-CM | POA: Diagnosis not present

## 2019-04-17 DIAGNOSIS — R5381 Other malaise: Secondary | ICD-10-CM | POA: Diagnosis not present

## 2019-04-17 DIAGNOSIS — Z9981 Dependence on supplemental oxygen: Secondary | ICD-10-CM | POA: Diagnosis not present

## 2019-04-17 DIAGNOSIS — E1121 Type 2 diabetes mellitus with diabetic nephropathy: Secondary | ICD-10-CM | POA: Diagnosis not present

## 2019-04-17 DIAGNOSIS — Z794 Long term (current) use of insulin: Secondary | ICD-10-CM | POA: Diagnosis not present

## 2019-04-18 DIAGNOSIS — Z982 Presence of cerebrospinal fluid drainage device: Secondary | ICD-10-CM | POA: Diagnosis not present

## 2019-04-18 DIAGNOSIS — Z7982 Long term (current) use of aspirin: Secondary | ICD-10-CM | POA: Diagnosis not present

## 2019-04-18 DIAGNOSIS — G910 Communicating hydrocephalus: Secondary | ICD-10-CM | POA: Diagnosis not present

## 2019-04-18 DIAGNOSIS — Z7409 Other reduced mobility: Secondary | ICD-10-CM | POA: Diagnosis not present

## 2019-04-18 DIAGNOSIS — G6181 Chronic inflammatory demyelinating polyneuritis: Secondary | ICD-10-CM | POA: Diagnosis not present

## 2019-04-18 DIAGNOSIS — E1142 Type 2 diabetes mellitus with diabetic polyneuropathy: Secondary | ICD-10-CM | POA: Diagnosis not present

## 2019-04-18 DIAGNOSIS — E1121 Type 2 diabetes mellitus with diabetic nephropathy: Secondary | ICD-10-CM | POA: Diagnosis not present

## 2019-04-18 DIAGNOSIS — I480 Paroxysmal atrial fibrillation: Secondary | ICD-10-CM | POA: Diagnosis not present

## 2019-04-18 DIAGNOSIS — G4733 Obstructive sleep apnea (adult) (pediatric): Secondary | ICD-10-CM | POA: Diagnosis not present

## 2019-04-18 DIAGNOSIS — M1611 Unilateral primary osteoarthritis, right hip: Secondary | ICD-10-CM | POA: Diagnosis not present

## 2019-04-18 DIAGNOSIS — I5023 Acute on chronic systolic (congestive) heart failure: Secondary | ICD-10-CM | POA: Diagnosis not present

## 2019-04-18 DIAGNOSIS — Z794 Long term (current) use of insulin: Secondary | ICD-10-CM | POA: Diagnosis not present

## 2019-04-18 DIAGNOSIS — I429 Cardiomyopathy, unspecified: Secondary | ICD-10-CM | POA: Diagnosis not present

## 2019-04-18 DIAGNOSIS — I890 Lymphedema, not elsewhere classified: Secondary | ICD-10-CM | POA: Diagnosis not present

## 2019-04-18 DIAGNOSIS — S91301A Unspecified open wound, right foot, initial encounter: Secondary | ICD-10-CM | POA: Diagnosis not present

## 2019-04-18 DIAGNOSIS — I110 Hypertensive heart disease with heart failure: Secondary | ICD-10-CM | POA: Diagnosis not present

## 2019-04-18 DIAGNOSIS — Z9981 Dependence on supplemental oxygen: Secondary | ICD-10-CM | POA: Diagnosis not present

## 2019-04-18 DIAGNOSIS — Z7901 Long term (current) use of anticoagulants: Secondary | ICD-10-CM | POA: Diagnosis not present

## 2019-04-18 DIAGNOSIS — R29898 Other symptoms and signs involving the musculoskeletal system: Secondary | ICD-10-CM | POA: Diagnosis not present

## 2019-04-18 DIAGNOSIS — R5381 Other malaise: Secondary | ICD-10-CM | POA: Diagnosis not present

## 2019-04-18 DIAGNOSIS — I447 Left bundle-branch block, unspecified: Secondary | ICD-10-CM | POA: Diagnosis not present

## 2019-04-18 DIAGNOSIS — Z79899 Other long term (current) drug therapy: Secondary | ICD-10-CM | POA: Diagnosis not present

## 2019-04-20 DIAGNOSIS — I509 Heart failure, unspecified: Secondary | ICD-10-CM | POA: Diagnosis not present

## 2019-04-22 DIAGNOSIS — Z7982 Long term (current) use of aspirin: Secondary | ICD-10-CM | POA: Diagnosis not present

## 2019-04-22 DIAGNOSIS — E1142 Type 2 diabetes mellitus with diabetic polyneuropathy: Secondary | ICD-10-CM | POA: Diagnosis not present

## 2019-04-22 DIAGNOSIS — I890 Lymphedema, not elsewhere classified: Secondary | ICD-10-CM | POA: Diagnosis not present

## 2019-04-22 DIAGNOSIS — I110 Hypertensive heart disease with heart failure: Secondary | ICD-10-CM | POA: Diagnosis not present

## 2019-04-22 DIAGNOSIS — I5023 Acute on chronic systolic (congestive) heart failure: Secondary | ICD-10-CM | POA: Diagnosis not present

## 2019-04-22 DIAGNOSIS — R5381 Other malaise: Secondary | ICD-10-CM | POA: Diagnosis not present

## 2019-04-22 DIAGNOSIS — E1121 Type 2 diabetes mellitus with diabetic nephropathy: Secondary | ICD-10-CM | POA: Diagnosis not present

## 2019-04-22 DIAGNOSIS — I429 Cardiomyopathy, unspecified: Secondary | ICD-10-CM | POA: Diagnosis not present

## 2019-04-22 DIAGNOSIS — Z79899 Other long term (current) drug therapy: Secondary | ICD-10-CM | POA: Diagnosis not present

## 2019-04-22 DIAGNOSIS — M1611 Unilateral primary osteoarthritis, right hip: Secondary | ICD-10-CM | POA: Diagnosis not present

## 2019-04-22 DIAGNOSIS — G6181 Chronic inflammatory demyelinating polyneuritis: Secondary | ICD-10-CM | POA: Diagnosis not present

## 2019-04-22 DIAGNOSIS — Z9981 Dependence on supplemental oxygen: Secondary | ICD-10-CM | POA: Diagnosis not present

## 2019-04-22 DIAGNOSIS — G4733 Obstructive sleep apnea (adult) (pediatric): Secondary | ICD-10-CM | POA: Diagnosis not present

## 2019-04-22 DIAGNOSIS — I447 Left bundle-branch block, unspecified: Secondary | ICD-10-CM | POA: Diagnosis not present

## 2019-04-22 DIAGNOSIS — Z7901 Long term (current) use of anticoagulants: Secondary | ICD-10-CM | POA: Diagnosis not present

## 2019-04-22 DIAGNOSIS — G910 Communicating hydrocephalus: Secondary | ICD-10-CM | POA: Diagnosis not present

## 2019-04-22 DIAGNOSIS — Z794 Long term (current) use of insulin: Secondary | ICD-10-CM | POA: Diagnosis not present

## 2019-04-22 DIAGNOSIS — Z7409 Other reduced mobility: Secondary | ICD-10-CM | POA: Diagnosis not present

## 2019-04-22 DIAGNOSIS — Z982 Presence of cerebrospinal fluid drainage device: Secondary | ICD-10-CM | POA: Diagnosis not present

## 2019-04-22 DIAGNOSIS — S91301A Unspecified open wound, right foot, initial encounter: Secondary | ICD-10-CM | POA: Diagnosis not present

## 2019-04-22 DIAGNOSIS — R29898 Other symptoms and signs involving the musculoskeletal system: Secondary | ICD-10-CM | POA: Diagnosis not present

## 2019-04-22 DIAGNOSIS — I480 Paroxysmal atrial fibrillation: Secondary | ICD-10-CM | POA: Diagnosis not present

## 2019-04-23 DIAGNOSIS — G910 Communicating hydrocephalus: Secondary | ICD-10-CM | POA: Diagnosis not present

## 2019-04-23 DIAGNOSIS — Z9981 Dependence on supplemental oxygen: Secondary | ICD-10-CM | POA: Diagnosis not present

## 2019-04-23 DIAGNOSIS — Z7409 Other reduced mobility: Secondary | ICD-10-CM | POA: Diagnosis not present

## 2019-04-23 DIAGNOSIS — I890 Lymphedema, not elsewhere classified: Secondary | ICD-10-CM | POA: Diagnosis not present

## 2019-04-23 DIAGNOSIS — M1611 Unilateral primary osteoarthritis, right hip: Secondary | ICD-10-CM | POA: Diagnosis not present

## 2019-04-23 DIAGNOSIS — Z79899 Other long term (current) drug therapy: Secondary | ICD-10-CM | POA: Diagnosis not present

## 2019-04-23 DIAGNOSIS — G4733 Obstructive sleep apnea (adult) (pediatric): Secondary | ICD-10-CM | POA: Diagnosis not present

## 2019-04-23 DIAGNOSIS — I429 Cardiomyopathy, unspecified: Secondary | ICD-10-CM | POA: Diagnosis not present

## 2019-04-23 DIAGNOSIS — Z7901 Long term (current) use of anticoagulants: Secondary | ICD-10-CM | POA: Diagnosis not present

## 2019-04-23 DIAGNOSIS — R29898 Other symptoms and signs involving the musculoskeletal system: Secondary | ICD-10-CM | POA: Diagnosis not present

## 2019-04-23 DIAGNOSIS — Z982 Presence of cerebrospinal fluid drainage device: Secondary | ICD-10-CM | POA: Diagnosis not present

## 2019-04-23 DIAGNOSIS — I480 Paroxysmal atrial fibrillation: Secondary | ICD-10-CM | POA: Diagnosis not present

## 2019-04-23 DIAGNOSIS — Z7982 Long term (current) use of aspirin: Secondary | ICD-10-CM | POA: Diagnosis not present

## 2019-04-23 DIAGNOSIS — S91301A Unspecified open wound, right foot, initial encounter: Secondary | ICD-10-CM | POA: Diagnosis not present

## 2019-04-23 DIAGNOSIS — Z794 Long term (current) use of insulin: Secondary | ICD-10-CM | POA: Diagnosis not present

## 2019-04-23 DIAGNOSIS — I447 Left bundle-branch block, unspecified: Secondary | ICD-10-CM | POA: Diagnosis not present

## 2019-04-23 DIAGNOSIS — E1121 Type 2 diabetes mellitus with diabetic nephropathy: Secondary | ICD-10-CM | POA: Diagnosis not present

## 2019-04-23 DIAGNOSIS — G6181 Chronic inflammatory demyelinating polyneuritis: Secondary | ICD-10-CM | POA: Diagnosis not present

## 2019-04-23 DIAGNOSIS — R5381 Other malaise: Secondary | ICD-10-CM | POA: Diagnosis not present

## 2019-04-23 DIAGNOSIS — E1142 Type 2 diabetes mellitus with diabetic polyneuropathy: Secondary | ICD-10-CM | POA: Diagnosis not present

## 2019-04-23 DIAGNOSIS — I5023 Acute on chronic systolic (congestive) heart failure: Secondary | ICD-10-CM | POA: Diagnosis not present

## 2019-04-23 DIAGNOSIS — I110 Hypertensive heart disease with heart failure: Secondary | ICD-10-CM | POA: Diagnosis not present

## 2019-04-28 DIAGNOSIS — I480 Paroxysmal atrial fibrillation: Secondary | ICD-10-CM | POA: Diagnosis not present

## 2019-04-28 DIAGNOSIS — Z982 Presence of cerebrospinal fluid drainage device: Secondary | ICD-10-CM | POA: Diagnosis not present

## 2019-04-28 DIAGNOSIS — R29898 Other symptoms and signs involving the musculoskeletal system: Secondary | ICD-10-CM | POA: Diagnosis not present

## 2019-04-28 DIAGNOSIS — G4733 Obstructive sleep apnea (adult) (pediatric): Secondary | ICD-10-CM | POA: Diagnosis not present

## 2019-04-28 DIAGNOSIS — I110 Hypertensive heart disease with heart failure: Secondary | ICD-10-CM | POA: Diagnosis not present

## 2019-04-28 DIAGNOSIS — E1142 Type 2 diabetes mellitus with diabetic polyneuropathy: Secondary | ICD-10-CM | POA: Diagnosis not present

## 2019-04-28 DIAGNOSIS — S91301A Unspecified open wound, right foot, initial encounter: Secondary | ICD-10-CM | POA: Diagnosis not present

## 2019-04-28 DIAGNOSIS — E1121 Type 2 diabetes mellitus with diabetic nephropathy: Secondary | ICD-10-CM | POA: Diagnosis not present

## 2019-04-28 DIAGNOSIS — Z7409 Other reduced mobility: Secondary | ICD-10-CM | POA: Diagnosis not present

## 2019-04-28 DIAGNOSIS — G910 Communicating hydrocephalus: Secondary | ICD-10-CM | POA: Diagnosis not present

## 2019-04-28 DIAGNOSIS — I890 Lymphedema, not elsewhere classified: Secondary | ICD-10-CM | POA: Diagnosis not present

## 2019-04-28 DIAGNOSIS — Z7982 Long term (current) use of aspirin: Secondary | ICD-10-CM | POA: Diagnosis not present

## 2019-04-28 DIAGNOSIS — M1611 Unilateral primary osteoarthritis, right hip: Secondary | ICD-10-CM | POA: Diagnosis not present

## 2019-04-28 DIAGNOSIS — I447 Left bundle-branch block, unspecified: Secondary | ICD-10-CM | POA: Diagnosis not present

## 2019-04-28 DIAGNOSIS — G6181 Chronic inflammatory demyelinating polyneuritis: Secondary | ICD-10-CM | POA: Diagnosis not present

## 2019-04-28 DIAGNOSIS — Z9981 Dependence on supplemental oxygen: Secondary | ICD-10-CM | POA: Diagnosis not present

## 2019-04-28 DIAGNOSIS — Z794 Long term (current) use of insulin: Secondary | ICD-10-CM | POA: Diagnosis not present

## 2019-04-28 DIAGNOSIS — Z7901 Long term (current) use of anticoagulants: Secondary | ICD-10-CM | POA: Diagnosis not present

## 2019-04-28 DIAGNOSIS — R5381 Other malaise: Secondary | ICD-10-CM | POA: Diagnosis not present

## 2019-04-28 DIAGNOSIS — I429 Cardiomyopathy, unspecified: Secondary | ICD-10-CM | POA: Diagnosis not present

## 2019-04-28 DIAGNOSIS — I5023 Acute on chronic systolic (congestive) heart failure: Secondary | ICD-10-CM | POA: Diagnosis not present

## 2019-04-28 DIAGNOSIS — Z79899 Other long term (current) drug therapy: Secondary | ICD-10-CM | POA: Diagnosis not present

## 2019-04-29 DIAGNOSIS — I447 Left bundle-branch block, unspecified: Secondary | ICD-10-CM | POA: Diagnosis not present

## 2019-04-29 DIAGNOSIS — Z794 Long term (current) use of insulin: Secondary | ICD-10-CM | POA: Diagnosis not present

## 2019-04-29 DIAGNOSIS — I5023 Acute on chronic systolic (congestive) heart failure: Secondary | ICD-10-CM | POA: Diagnosis not present

## 2019-04-29 DIAGNOSIS — Z9981 Dependence on supplemental oxygen: Secondary | ICD-10-CM | POA: Diagnosis not present

## 2019-04-29 DIAGNOSIS — I110 Hypertensive heart disease with heart failure: Secondary | ICD-10-CM | POA: Diagnosis not present

## 2019-04-29 DIAGNOSIS — R29898 Other symptoms and signs involving the musculoskeletal system: Secondary | ICD-10-CM | POA: Diagnosis not present

## 2019-04-29 DIAGNOSIS — Z79899 Other long term (current) drug therapy: Secondary | ICD-10-CM | POA: Diagnosis not present

## 2019-04-29 DIAGNOSIS — Z982 Presence of cerebrospinal fluid drainage device: Secondary | ICD-10-CM | POA: Diagnosis not present

## 2019-04-29 DIAGNOSIS — S91301A Unspecified open wound, right foot, initial encounter: Secondary | ICD-10-CM | POA: Diagnosis not present

## 2019-04-29 DIAGNOSIS — G4733 Obstructive sleep apnea (adult) (pediatric): Secondary | ICD-10-CM | POA: Diagnosis not present

## 2019-04-29 DIAGNOSIS — I480 Paroxysmal atrial fibrillation: Secondary | ICD-10-CM | POA: Diagnosis not present

## 2019-04-29 DIAGNOSIS — Z7409 Other reduced mobility: Secondary | ICD-10-CM | POA: Diagnosis not present

## 2019-04-29 DIAGNOSIS — G6181 Chronic inflammatory demyelinating polyneuritis: Secondary | ICD-10-CM | POA: Diagnosis not present

## 2019-04-29 DIAGNOSIS — M1611 Unilateral primary osteoarthritis, right hip: Secondary | ICD-10-CM | POA: Diagnosis not present

## 2019-04-29 DIAGNOSIS — E1142 Type 2 diabetes mellitus with diabetic polyneuropathy: Secondary | ICD-10-CM | POA: Diagnosis not present

## 2019-04-29 DIAGNOSIS — E1121 Type 2 diabetes mellitus with diabetic nephropathy: Secondary | ICD-10-CM | POA: Diagnosis not present

## 2019-04-29 DIAGNOSIS — Z7982 Long term (current) use of aspirin: Secondary | ICD-10-CM | POA: Diagnosis not present

## 2019-04-29 DIAGNOSIS — Z7901 Long term (current) use of anticoagulants: Secondary | ICD-10-CM | POA: Diagnosis not present

## 2019-04-29 DIAGNOSIS — R5381 Other malaise: Secondary | ICD-10-CM | POA: Diagnosis not present

## 2019-04-29 DIAGNOSIS — I890 Lymphedema, not elsewhere classified: Secondary | ICD-10-CM | POA: Diagnosis not present

## 2019-04-29 DIAGNOSIS — I429 Cardiomyopathy, unspecified: Secondary | ICD-10-CM | POA: Diagnosis not present

## 2019-04-29 DIAGNOSIS — G910 Communicating hydrocephalus: Secondary | ICD-10-CM | POA: Diagnosis not present

## 2019-05-01 DIAGNOSIS — Z794 Long term (current) use of insulin: Secondary | ICD-10-CM | POA: Diagnosis not present

## 2019-05-01 DIAGNOSIS — I890 Lymphedema, not elsewhere classified: Secondary | ICD-10-CM | POA: Diagnosis not present

## 2019-05-01 DIAGNOSIS — R29898 Other symptoms and signs involving the musculoskeletal system: Secondary | ICD-10-CM | POA: Diagnosis not present

## 2019-05-01 DIAGNOSIS — Z7409 Other reduced mobility: Secondary | ICD-10-CM | POA: Diagnosis not present

## 2019-05-01 DIAGNOSIS — I110 Hypertensive heart disease with heart failure: Secondary | ICD-10-CM | POA: Diagnosis not present

## 2019-05-01 DIAGNOSIS — I447 Left bundle-branch block, unspecified: Secondary | ICD-10-CM | POA: Diagnosis not present

## 2019-05-01 DIAGNOSIS — I480 Paroxysmal atrial fibrillation: Secondary | ICD-10-CM | POA: Diagnosis not present

## 2019-05-01 DIAGNOSIS — R5381 Other malaise: Secondary | ICD-10-CM | POA: Diagnosis not present

## 2019-05-01 DIAGNOSIS — S91301A Unspecified open wound, right foot, initial encounter: Secondary | ICD-10-CM | POA: Diagnosis not present

## 2019-05-01 DIAGNOSIS — M1611 Unilateral primary osteoarthritis, right hip: Secondary | ICD-10-CM | POA: Diagnosis not present

## 2019-05-01 DIAGNOSIS — Z79899 Other long term (current) drug therapy: Secondary | ICD-10-CM | POA: Diagnosis not present

## 2019-05-01 DIAGNOSIS — G6181 Chronic inflammatory demyelinating polyneuritis: Secondary | ICD-10-CM | POA: Diagnosis not present

## 2019-05-01 DIAGNOSIS — Z7901 Long term (current) use of anticoagulants: Secondary | ICD-10-CM | POA: Diagnosis not present

## 2019-05-01 DIAGNOSIS — Z7982 Long term (current) use of aspirin: Secondary | ICD-10-CM | POA: Diagnosis not present

## 2019-05-01 DIAGNOSIS — Z982 Presence of cerebrospinal fluid drainage device: Secondary | ICD-10-CM | POA: Diagnosis not present

## 2019-05-01 DIAGNOSIS — G910 Communicating hydrocephalus: Secondary | ICD-10-CM | POA: Diagnosis not present

## 2019-05-01 DIAGNOSIS — I429 Cardiomyopathy, unspecified: Secondary | ICD-10-CM | POA: Diagnosis not present

## 2019-05-01 DIAGNOSIS — I5023 Acute on chronic systolic (congestive) heart failure: Secondary | ICD-10-CM | POA: Diagnosis not present

## 2019-05-01 DIAGNOSIS — E1142 Type 2 diabetes mellitus with diabetic polyneuropathy: Secondary | ICD-10-CM | POA: Diagnosis not present

## 2019-05-01 DIAGNOSIS — E1121 Type 2 diabetes mellitus with diabetic nephropathy: Secondary | ICD-10-CM | POA: Diagnosis not present

## 2019-05-01 DIAGNOSIS — Z9981 Dependence on supplemental oxygen: Secondary | ICD-10-CM | POA: Diagnosis not present

## 2019-05-01 DIAGNOSIS — G4733 Obstructive sleep apnea (adult) (pediatric): Secondary | ICD-10-CM | POA: Diagnosis not present

## 2019-05-02 DIAGNOSIS — R5381 Other malaise: Secondary | ICD-10-CM | POA: Diagnosis not present

## 2019-05-02 DIAGNOSIS — Z7409 Other reduced mobility: Secondary | ICD-10-CM | POA: Diagnosis not present

## 2019-05-02 DIAGNOSIS — I447 Left bundle-branch block, unspecified: Secondary | ICD-10-CM | POA: Diagnosis not present

## 2019-05-02 DIAGNOSIS — I480 Paroxysmal atrial fibrillation: Secondary | ICD-10-CM | POA: Diagnosis not present

## 2019-05-02 DIAGNOSIS — G6181 Chronic inflammatory demyelinating polyneuritis: Secondary | ICD-10-CM | POA: Diagnosis not present

## 2019-05-02 DIAGNOSIS — I5023 Acute on chronic systolic (congestive) heart failure: Secondary | ICD-10-CM | POA: Diagnosis not present

## 2019-05-02 DIAGNOSIS — Z982 Presence of cerebrospinal fluid drainage device: Secondary | ICD-10-CM | POA: Diagnosis not present

## 2019-05-02 DIAGNOSIS — E1121 Type 2 diabetes mellitus with diabetic nephropathy: Secondary | ICD-10-CM | POA: Diagnosis not present

## 2019-05-02 DIAGNOSIS — I110 Hypertensive heart disease with heart failure: Secondary | ICD-10-CM | POA: Diagnosis not present

## 2019-05-02 DIAGNOSIS — Z79899 Other long term (current) drug therapy: Secondary | ICD-10-CM | POA: Diagnosis not present

## 2019-05-02 DIAGNOSIS — Z7982 Long term (current) use of aspirin: Secondary | ICD-10-CM | POA: Diagnosis not present

## 2019-05-02 DIAGNOSIS — R29898 Other symptoms and signs involving the musculoskeletal system: Secondary | ICD-10-CM | POA: Diagnosis not present

## 2019-05-02 DIAGNOSIS — S91301A Unspecified open wound, right foot, initial encounter: Secondary | ICD-10-CM | POA: Diagnosis not present

## 2019-05-02 DIAGNOSIS — I429 Cardiomyopathy, unspecified: Secondary | ICD-10-CM | POA: Diagnosis not present

## 2019-05-02 DIAGNOSIS — Z7901 Long term (current) use of anticoagulants: Secondary | ICD-10-CM | POA: Diagnosis not present

## 2019-05-02 DIAGNOSIS — Z794 Long term (current) use of insulin: Secondary | ICD-10-CM | POA: Diagnosis not present

## 2019-05-02 DIAGNOSIS — E1142 Type 2 diabetes mellitus with diabetic polyneuropathy: Secondary | ICD-10-CM | POA: Diagnosis not present

## 2019-05-02 DIAGNOSIS — M1611 Unilateral primary osteoarthritis, right hip: Secondary | ICD-10-CM | POA: Diagnosis not present

## 2019-05-02 DIAGNOSIS — G910 Communicating hydrocephalus: Secondary | ICD-10-CM | POA: Diagnosis not present

## 2019-05-02 DIAGNOSIS — I890 Lymphedema, not elsewhere classified: Secondary | ICD-10-CM | POA: Diagnosis not present

## 2019-05-02 DIAGNOSIS — Z9981 Dependence on supplemental oxygen: Secondary | ICD-10-CM | POA: Diagnosis not present

## 2019-05-02 DIAGNOSIS — G4733 Obstructive sleep apnea (adult) (pediatric): Secondary | ICD-10-CM | POA: Diagnosis not present

## 2019-05-05 DIAGNOSIS — Z9981 Dependence on supplemental oxygen: Secondary | ICD-10-CM | POA: Diagnosis not present

## 2019-05-05 DIAGNOSIS — Z7901 Long term (current) use of anticoagulants: Secondary | ICD-10-CM | POA: Diagnosis not present

## 2019-05-05 DIAGNOSIS — Z7409 Other reduced mobility: Secondary | ICD-10-CM | POA: Diagnosis not present

## 2019-05-05 DIAGNOSIS — R5381 Other malaise: Secondary | ICD-10-CM | POA: Diagnosis not present

## 2019-05-05 DIAGNOSIS — Z79899 Other long term (current) drug therapy: Secondary | ICD-10-CM | POA: Diagnosis not present

## 2019-05-05 DIAGNOSIS — S91301A Unspecified open wound, right foot, initial encounter: Secondary | ICD-10-CM | POA: Diagnosis not present

## 2019-05-05 DIAGNOSIS — E1142 Type 2 diabetes mellitus with diabetic polyneuropathy: Secondary | ICD-10-CM | POA: Diagnosis not present

## 2019-05-05 DIAGNOSIS — G910 Communicating hydrocephalus: Secondary | ICD-10-CM | POA: Diagnosis not present

## 2019-05-05 DIAGNOSIS — I5023 Acute on chronic systolic (congestive) heart failure: Secondary | ICD-10-CM | POA: Diagnosis not present

## 2019-05-05 DIAGNOSIS — G6181 Chronic inflammatory demyelinating polyneuritis: Secondary | ICD-10-CM | POA: Diagnosis not present

## 2019-05-05 DIAGNOSIS — I447 Left bundle-branch block, unspecified: Secondary | ICD-10-CM | POA: Diagnosis not present

## 2019-05-05 DIAGNOSIS — R29898 Other symptoms and signs involving the musculoskeletal system: Secondary | ICD-10-CM | POA: Diagnosis not present

## 2019-05-05 DIAGNOSIS — E1121 Type 2 diabetes mellitus with diabetic nephropathy: Secondary | ICD-10-CM | POA: Diagnosis not present

## 2019-05-05 DIAGNOSIS — Z982 Presence of cerebrospinal fluid drainage device: Secondary | ICD-10-CM | POA: Diagnosis not present

## 2019-05-05 DIAGNOSIS — Z794 Long term (current) use of insulin: Secondary | ICD-10-CM | POA: Diagnosis not present

## 2019-05-05 DIAGNOSIS — I429 Cardiomyopathy, unspecified: Secondary | ICD-10-CM | POA: Diagnosis not present

## 2019-05-05 DIAGNOSIS — G4733 Obstructive sleep apnea (adult) (pediatric): Secondary | ICD-10-CM | POA: Diagnosis not present

## 2019-05-05 DIAGNOSIS — I480 Paroxysmal atrial fibrillation: Secondary | ICD-10-CM | POA: Diagnosis not present

## 2019-05-05 DIAGNOSIS — I890 Lymphedema, not elsewhere classified: Secondary | ICD-10-CM | POA: Diagnosis not present

## 2019-05-05 DIAGNOSIS — M1611 Unilateral primary osteoarthritis, right hip: Secondary | ICD-10-CM | POA: Diagnosis not present

## 2019-05-05 DIAGNOSIS — I110 Hypertensive heart disease with heart failure: Secondary | ICD-10-CM | POA: Diagnosis not present

## 2019-05-05 DIAGNOSIS — Z7982 Long term (current) use of aspirin: Secondary | ICD-10-CM | POA: Diagnosis not present

## 2019-05-06 DIAGNOSIS — G910 Communicating hydrocephalus: Secondary | ICD-10-CM | POA: Diagnosis not present

## 2019-05-06 DIAGNOSIS — Z794 Long term (current) use of insulin: Secondary | ICD-10-CM | POA: Diagnosis not present

## 2019-05-06 DIAGNOSIS — M1611 Unilateral primary osteoarthritis, right hip: Secondary | ICD-10-CM | POA: Diagnosis not present

## 2019-05-06 DIAGNOSIS — I447 Left bundle-branch block, unspecified: Secondary | ICD-10-CM | POA: Diagnosis not present

## 2019-05-06 DIAGNOSIS — R5381 Other malaise: Secondary | ICD-10-CM | POA: Diagnosis not present

## 2019-05-06 DIAGNOSIS — E1142 Type 2 diabetes mellitus with diabetic polyneuropathy: Secondary | ICD-10-CM | POA: Diagnosis not present

## 2019-05-06 DIAGNOSIS — Z79899 Other long term (current) drug therapy: Secondary | ICD-10-CM | POA: Diagnosis not present

## 2019-05-06 DIAGNOSIS — Z7901 Long term (current) use of anticoagulants: Secondary | ICD-10-CM | POA: Diagnosis not present

## 2019-05-06 DIAGNOSIS — S91301A Unspecified open wound, right foot, initial encounter: Secondary | ICD-10-CM | POA: Diagnosis not present

## 2019-05-06 DIAGNOSIS — R29898 Other symptoms and signs involving the musculoskeletal system: Secondary | ICD-10-CM | POA: Diagnosis not present

## 2019-05-06 DIAGNOSIS — G6181 Chronic inflammatory demyelinating polyneuritis: Secondary | ICD-10-CM | POA: Diagnosis not present

## 2019-05-06 DIAGNOSIS — I890 Lymphedema, not elsewhere classified: Secondary | ICD-10-CM | POA: Diagnosis not present

## 2019-05-06 DIAGNOSIS — E1121 Type 2 diabetes mellitus with diabetic nephropathy: Secondary | ICD-10-CM | POA: Diagnosis not present

## 2019-05-06 DIAGNOSIS — I5023 Acute on chronic systolic (congestive) heart failure: Secondary | ICD-10-CM | POA: Diagnosis not present

## 2019-05-06 DIAGNOSIS — I110 Hypertensive heart disease with heart failure: Secondary | ICD-10-CM | POA: Diagnosis not present

## 2019-05-06 DIAGNOSIS — G4733 Obstructive sleep apnea (adult) (pediatric): Secondary | ICD-10-CM | POA: Diagnosis not present

## 2019-05-06 DIAGNOSIS — Z7982 Long term (current) use of aspirin: Secondary | ICD-10-CM | POA: Diagnosis not present

## 2019-05-06 DIAGNOSIS — Z982 Presence of cerebrospinal fluid drainage device: Secondary | ICD-10-CM | POA: Diagnosis not present

## 2019-05-06 DIAGNOSIS — I480 Paroxysmal atrial fibrillation: Secondary | ICD-10-CM | POA: Diagnosis not present

## 2019-05-06 DIAGNOSIS — I429 Cardiomyopathy, unspecified: Secondary | ICD-10-CM | POA: Diagnosis not present

## 2019-05-06 DIAGNOSIS — Z9981 Dependence on supplemental oxygen: Secondary | ICD-10-CM | POA: Diagnosis not present

## 2019-05-06 DIAGNOSIS — Z7409 Other reduced mobility: Secondary | ICD-10-CM | POA: Diagnosis not present

## 2019-05-07 DIAGNOSIS — E1121 Type 2 diabetes mellitus with diabetic nephropathy: Secondary | ICD-10-CM | POA: Diagnosis not present

## 2019-05-07 DIAGNOSIS — I2510 Atherosclerotic heart disease of native coronary artery without angina pectoris: Secondary | ICD-10-CM | POA: Diagnosis not present

## 2019-05-07 DIAGNOSIS — G910 Communicating hydrocephalus: Secondary | ICD-10-CM | POA: Diagnosis not present

## 2019-05-07 DIAGNOSIS — I110 Hypertensive heart disease with heart failure: Secondary | ICD-10-CM | POA: Diagnosis not present

## 2019-05-07 DIAGNOSIS — Z7982 Long term (current) use of aspirin: Secondary | ICD-10-CM | POA: Diagnosis not present

## 2019-05-07 DIAGNOSIS — E1142 Type 2 diabetes mellitus with diabetic polyneuropathy: Secondary | ICD-10-CM | POA: Diagnosis not present

## 2019-05-07 DIAGNOSIS — R279 Unspecified lack of coordination: Secondary | ICD-10-CM | POA: Diagnosis not present

## 2019-05-07 DIAGNOSIS — M1611 Unilateral primary osteoarthritis, right hip: Secondary | ICD-10-CM | POA: Diagnosis not present

## 2019-05-07 DIAGNOSIS — I429 Cardiomyopathy, unspecified: Secondary | ICD-10-CM | POA: Diagnosis not present

## 2019-05-07 DIAGNOSIS — Z743 Need for continuous supervision: Secondary | ICD-10-CM | POA: Diagnosis not present

## 2019-05-07 DIAGNOSIS — R5381 Other malaise: Secondary | ICD-10-CM | POA: Diagnosis not present

## 2019-05-07 DIAGNOSIS — E119 Type 2 diabetes mellitus without complications: Secondary | ICD-10-CM | POA: Diagnosis not present

## 2019-05-07 DIAGNOSIS — I5023 Acute on chronic systolic (congestive) heart failure: Secondary | ICD-10-CM | POA: Diagnosis not present

## 2019-05-07 DIAGNOSIS — Z7901 Long term (current) use of anticoagulants: Secondary | ICD-10-CM | POA: Diagnosis not present

## 2019-05-07 DIAGNOSIS — R531 Weakness: Secondary | ICD-10-CM | POA: Diagnosis not present

## 2019-05-07 DIAGNOSIS — Z794 Long term (current) use of insulin: Secondary | ICD-10-CM | POA: Diagnosis not present

## 2019-05-07 DIAGNOSIS — G6181 Chronic inflammatory demyelinating polyneuritis: Secondary | ICD-10-CM | POA: Diagnosis not present

## 2019-05-07 DIAGNOSIS — S91301A Unspecified open wound, right foot, initial encounter: Secondary | ICD-10-CM | POA: Diagnosis not present

## 2019-05-07 DIAGNOSIS — G4733 Obstructive sleep apnea (adult) (pediatric): Secondary | ICD-10-CM | POA: Diagnosis not present

## 2019-05-07 DIAGNOSIS — Z9981 Dependence on supplemental oxygen: Secondary | ICD-10-CM | POA: Diagnosis not present

## 2019-05-07 DIAGNOSIS — I890 Lymphedema, not elsewhere classified: Secondary | ICD-10-CM | POA: Diagnosis not present

## 2019-05-07 DIAGNOSIS — I4891 Unspecified atrial fibrillation: Secondary | ICD-10-CM | POA: Diagnosis not present

## 2019-05-07 DIAGNOSIS — I447 Left bundle-branch block, unspecified: Secondary | ICD-10-CM | POA: Diagnosis not present

## 2019-05-07 DIAGNOSIS — Z982 Presence of cerebrospinal fluid drainage device: Secondary | ICD-10-CM | POA: Diagnosis not present

## 2019-05-07 DIAGNOSIS — R29898 Other symptoms and signs involving the musculoskeletal system: Secondary | ICD-10-CM | POA: Diagnosis not present

## 2019-05-07 DIAGNOSIS — R410 Disorientation, unspecified: Secondary | ICD-10-CM | POA: Diagnosis not present

## 2019-05-07 DIAGNOSIS — Z7409 Other reduced mobility: Secondary | ICD-10-CM | POA: Diagnosis not present

## 2019-05-07 DIAGNOSIS — Z79899 Other long term (current) drug therapy: Secondary | ICD-10-CM | POA: Diagnosis not present

## 2019-05-07 DIAGNOSIS — I480 Paroxysmal atrial fibrillation: Secondary | ICD-10-CM | POA: Diagnosis not present

## 2019-05-12 DIAGNOSIS — Z79899 Other long term (current) drug therapy: Secondary | ICD-10-CM | POA: Diagnosis not present

## 2019-05-12 DIAGNOSIS — G4733 Obstructive sleep apnea (adult) (pediatric): Secondary | ICD-10-CM | POA: Diagnosis not present

## 2019-05-12 DIAGNOSIS — I890 Lymphedema, not elsewhere classified: Secondary | ICD-10-CM | POA: Diagnosis not present

## 2019-05-12 DIAGNOSIS — S91301A Unspecified open wound, right foot, initial encounter: Secondary | ICD-10-CM | POA: Diagnosis not present

## 2019-05-12 DIAGNOSIS — Z7982 Long term (current) use of aspirin: Secondary | ICD-10-CM | POA: Diagnosis not present

## 2019-05-12 DIAGNOSIS — E1121 Type 2 diabetes mellitus with diabetic nephropathy: Secondary | ICD-10-CM | POA: Diagnosis not present

## 2019-05-12 DIAGNOSIS — I429 Cardiomyopathy, unspecified: Secondary | ICD-10-CM | POA: Diagnosis not present

## 2019-05-12 DIAGNOSIS — Z7901 Long term (current) use of anticoagulants: Secondary | ICD-10-CM | POA: Diagnosis not present

## 2019-05-12 DIAGNOSIS — Z7409 Other reduced mobility: Secondary | ICD-10-CM | POA: Diagnosis not present

## 2019-05-12 DIAGNOSIS — G910 Communicating hydrocephalus: Secondary | ICD-10-CM | POA: Diagnosis not present

## 2019-05-12 DIAGNOSIS — Z9981 Dependence on supplemental oxygen: Secondary | ICD-10-CM | POA: Diagnosis not present

## 2019-05-12 DIAGNOSIS — R5381 Other malaise: Secondary | ICD-10-CM | POA: Diagnosis not present

## 2019-05-12 DIAGNOSIS — R29898 Other symptoms and signs involving the musculoskeletal system: Secondary | ICD-10-CM | POA: Diagnosis not present

## 2019-05-12 DIAGNOSIS — I5023 Acute on chronic systolic (congestive) heart failure: Secondary | ICD-10-CM | POA: Diagnosis not present

## 2019-05-12 DIAGNOSIS — E1142 Type 2 diabetes mellitus with diabetic polyneuropathy: Secondary | ICD-10-CM | POA: Diagnosis not present

## 2019-05-12 DIAGNOSIS — M1611 Unilateral primary osteoarthritis, right hip: Secondary | ICD-10-CM | POA: Diagnosis not present

## 2019-05-12 DIAGNOSIS — I447 Left bundle-branch block, unspecified: Secondary | ICD-10-CM | POA: Diagnosis not present

## 2019-05-12 DIAGNOSIS — Z794 Long term (current) use of insulin: Secondary | ICD-10-CM | POA: Diagnosis not present

## 2019-05-12 DIAGNOSIS — G6181 Chronic inflammatory demyelinating polyneuritis: Secondary | ICD-10-CM | POA: Diagnosis not present

## 2019-05-12 DIAGNOSIS — I110 Hypertensive heart disease with heart failure: Secondary | ICD-10-CM | POA: Diagnosis not present

## 2019-05-12 DIAGNOSIS — Z982 Presence of cerebrospinal fluid drainage device: Secondary | ICD-10-CM | POA: Diagnosis not present

## 2019-05-12 DIAGNOSIS — I480 Paroxysmal atrial fibrillation: Secondary | ICD-10-CM | POA: Diagnosis not present

## 2019-05-13 DIAGNOSIS — Z982 Presence of cerebrospinal fluid drainage device: Secondary | ICD-10-CM | POA: Diagnosis not present

## 2019-05-13 DIAGNOSIS — Z7409 Other reduced mobility: Secondary | ICD-10-CM | POA: Diagnosis not present

## 2019-05-13 DIAGNOSIS — I890 Lymphedema, not elsewhere classified: Secondary | ICD-10-CM | POA: Diagnosis not present

## 2019-05-13 DIAGNOSIS — R5381 Other malaise: Secondary | ICD-10-CM | POA: Diagnosis not present

## 2019-05-13 DIAGNOSIS — I5023 Acute on chronic systolic (congestive) heart failure: Secondary | ICD-10-CM | POA: Diagnosis not present

## 2019-05-13 DIAGNOSIS — Z79899 Other long term (current) drug therapy: Secondary | ICD-10-CM | POA: Diagnosis not present

## 2019-05-13 DIAGNOSIS — I110 Hypertensive heart disease with heart failure: Secondary | ICD-10-CM | POA: Diagnosis not present

## 2019-05-13 DIAGNOSIS — S91301A Unspecified open wound, right foot, initial encounter: Secondary | ICD-10-CM | POA: Diagnosis not present

## 2019-05-13 DIAGNOSIS — M1611 Unilateral primary osteoarthritis, right hip: Secondary | ICD-10-CM | POA: Diagnosis not present

## 2019-05-13 DIAGNOSIS — Z9981 Dependence on supplemental oxygen: Secondary | ICD-10-CM | POA: Diagnosis not present

## 2019-05-13 DIAGNOSIS — G6181 Chronic inflammatory demyelinating polyneuritis: Secondary | ICD-10-CM | POA: Diagnosis not present

## 2019-05-13 DIAGNOSIS — G910 Communicating hydrocephalus: Secondary | ICD-10-CM | POA: Diagnosis not present

## 2019-05-13 DIAGNOSIS — Z7901 Long term (current) use of anticoagulants: Secondary | ICD-10-CM | POA: Diagnosis not present

## 2019-05-13 DIAGNOSIS — E1142 Type 2 diabetes mellitus with diabetic polyneuropathy: Secondary | ICD-10-CM | POA: Diagnosis not present

## 2019-05-13 DIAGNOSIS — Z7982 Long term (current) use of aspirin: Secondary | ICD-10-CM | POA: Diagnosis not present

## 2019-05-13 DIAGNOSIS — I447 Left bundle-branch block, unspecified: Secondary | ICD-10-CM | POA: Diagnosis not present

## 2019-05-13 DIAGNOSIS — Z794 Long term (current) use of insulin: Secondary | ICD-10-CM | POA: Diagnosis not present

## 2019-05-13 DIAGNOSIS — I429 Cardiomyopathy, unspecified: Secondary | ICD-10-CM | POA: Diagnosis not present

## 2019-05-13 DIAGNOSIS — I480 Paroxysmal atrial fibrillation: Secondary | ICD-10-CM | POA: Diagnosis not present

## 2019-05-13 DIAGNOSIS — R29898 Other symptoms and signs involving the musculoskeletal system: Secondary | ICD-10-CM | POA: Diagnosis not present

## 2019-05-13 DIAGNOSIS — G4733 Obstructive sleep apnea (adult) (pediatric): Secondary | ICD-10-CM | POA: Diagnosis not present

## 2019-05-13 DIAGNOSIS — E1121 Type 2 diabetes mellitus with diabetic nephropathy: Secondary | ICD-10-CM | POA: Diagnosis not present

## 2019-05-14 DIAGNOSIS — S91301A Unspecified open wound, right foot, initial encounter: Secondary | ICD-10-CM | POA: Diagnosis not present

## 2019-05-14 DIAGNOSIS — E1142 Type 2 diabetes mellitus with diabetic polyneuropathy: Secondary | ICD-10-CM | POA: Diagnosis not present

## 2019-05-14 DIAGNOSIS — R29898 Other symptoms and signs involving the musculoskeletal system: Secondary | ICD-10-CM | POA: Diagnosis not present

## 2019-05-14 DIAGNOSIS — G4733 Obstructive sleep apnea (adult) (pediatric): Secondary | ICD-10-CM | POA: Diagnosis not present

## 2019-05-14 DIAGNOSIS — I447 Left bundle-branch block, unspecified: Secondary | ICD-10-CM | POA: Diagnosis not present

## 2019-05-14 DIAGNOSIS — Z7409 Other reduced mobility: Secondary | ICD-10-CM | POA: Diagnosis not present

## 2019-05-14 DIAGNOSIS — I5023 Acute on chronic systolic (congestive) heart failure: Secondary | ICD-10-CM | POA: Diagnosis not present

## 2019-05-14 DIAGNOSIS — I429 Cardiomyopathy, unspecified: Secondary | ICD-10-CM | POA: Diagnosis not present

## 2019-05-14 DIAGNOSIS — I890 Lymphedema, not elsewhere classified: Secondary | ICD-10-CM | POA: Diagnosis not present

## 2019-05-14 DIAGNOSIS — Z79899 Other long term (current) drug therapy: Secondary | ICD-10-CM | POA: Diagnosis not present

## 2019-05-14 DIAGNOSIS — Z982 Presence of cerebrospinal fluid drainage device: Secondary | ICD-10-CM | POA: Diagnosis not present

## 2019-05-14 DIAGNOSIS — I110 Hypertensive heart disease with heart failure: Secondary | ICD-10-CM | POA: Diagnosis not present

## 2019-05-14 DIAGNOSIS — R5381 Other malaise: Secondary | ICD-10-CM | POA: Diagnosis not present

## 2019-05-14 DIAGNOSIS — G910 Communicating hydrocephalus: Secondary | ICD-10-CM | POA: Diagnosis not present

## 2019-05-14 DIAGNOSIS — G6181 Chronic inflammatory demyelinating polyneuritis: Secondary | ICD-10-CM | POA: Diagnosis not present

## 2019-05-14 DIAGNOSIS — Z9981 Dependence on supplemental oxygen: Secondary | ICD-10-CM | POA: Diagnosis not present

## 2019-05-14 DIAGNOSIS — Z7901 Long term (current) use of anticoagulants: Secondary | ICD-10-CM | POA: Diagnosis not present

## 2019-05-14 DIAGNOSIS — Z7982 Long term (current) use of aspirin: Secondary | ICD-10-CM | POA: Diagnosis not present

## 2019-05-14 DIAGNOSIS — Z794 Long term (current) use of insulin: Secondary | ICD-10-CM | POA: Diagnosis not present

## 2019-05-14 DIAGNOSIS — M1611 Unilateral primary osteoarthritis, right hip: Secondary | ICD-10-CM | POA: Diagnosis not present

## 2019-05-14 DIAGNOSIS — I480 Paroxysmal atrial fibrillation: Secondary | ICD-10-CM | POA: Diagnosis not present

## 2019-05-14 DIAGNOSIS — E1121 Type 2 diabetes mellitus with diabetic nephropathy: Secondary | ICD-10-CM | POA: Diagnosis not present

## 2019-05-15 DIAGNOSIS — Z982 Presence of cerebrospinal fluid drainage device: Secondary | ICD-10-CM | POA: Diagnosis not present

## 2019-05-15 DIAGNOSIS — Z7982 Long term (current) use of aspirin: Secondary | ICD-10-CM | POA: Diagnosis not present

## 2019-05-15 DIAGNOSIS — Z794 Long term (current) use of insulin: Secondary | ICD-10-CM | POA: Diagnosis not present

## 2019-05-15 DIAGNOSIS — I890 Lymphedema, not elsewhere classified: Secondary | ICD-10-CM | POA: Diagnosis not present

## 2019-05-15 DIAGNOSIS — I447 Left bundle-branch block, unspecified: Secondary | ICD-10-CM | POA: Diagnosis not present

## 2019-05-15 DIAGNOSIS — M1611 Unilateral primary osteoarthritis, right hip: Secondary | ICD-10-CM | POA: Diagnosis not present

## 2019-05-15 DIAGNOSIS — R29898 Other symptoms and signs involving the musculoskeletal system: Secondary | ICD-10-CM | POA: Diagnosis not present

## 2019-05-15 DIAGNOSIS — G6181 Chronic inflammatory demyelinating polyneuritis: Secondary | ICD-10-CM | POA: Diagnosis not present

## 2019-05-15 DIAGNOSIS — S91301A Unspecified open wound, right foot, initial encounter: Secondary | ICD-10-CM | POA: Diagnosis not present

## 2019-05-15 DIAGNOSIS — Z79899 Other long term (current) drug therapy: Secondary | ICD-10-CM | POA: Diagnosis not present

## 2019-05-15 DIAGNOSIS — G910 Communicating hydrocephalus: Secondary | ICD-10-CM | POA: Diagnosis not present

## 2019-05-15 DIAGNOSIS — Z7409 Other reduced mobility: Secondary | ICD-10-CM | POA: Diagnosis not present

## 2019-05-15 DIAGNOSIS — R5381 Other malaise: Secondary | ICD-10-CM | POA: Diagnosis not present

## 2019-05-15 DIAGNOSIS — I480 Paroxysmal atrial fibrillation: Secondary | ICD-10-CM | POA: Diagnosis not present

## 2019-05-15 DIAGNOSIS — I110 Hypertensive heart disease with heart failure: Secondary | ICD-10-CM | POA: Diagnosis not present

## 2019-05-15 DIAGNOSIS — I429 Cardiomyopathy, unspecified: Secondary | ICD-10-CM | POA: Diagnosis not present

## 2019-05-15 DIAGNOSIS — E1121 Type 2 diabetes mellitus with diabetic nephropathy: Secondary | ICD-10-CM | POA: Diagnosis not present

## 2019-05-15 DIAGNOSIS — Z9981 Dependence on supplemental oxygen: Secondary | ICD-10-CM | POA: Diagnosis not present

## 2019-05-15 DIAGNOSIS — E1142 Type 2 diabetes mellitus with diabetic polyneuropathy: Secondary | ICD-10-CM | POA: Diagnosis not present

## 2019-05-15 DIAGNOSIS — I5023 Acute on chronic systolic (congestive) heart failure: Secondary | ICD-10-CM | POA: Diagnosis not present

## 2019-05-15 DIAGNOSIS — G4733 Obstructive sleep apnea (adult) (pediatric): Secondary | ICD-10-CM | POA: Diagnosis not present

## 2019-05-15 DIAGNOSIS — Z7901 Long term (current) use of anticoagulants: Secondary | ICD-10-CM | POA: Diagnosis not present

## 2019-05-19 ENCOUNTER — Ambulatory Visit (INDEPENDENT_AMBULATORY_CARE_PROVIDER_SITE_OTHER): Payer: Medicare Other

## 2019-05-19 DIAGNOSIS — I480 Paroxysmal atrial fibrillation: Secondary | ICD-10-CM | POA: Diagnosis not present

## 2019-05-19 DIAGNOSIS — Z982 Presence of cerebrospinal fluid drainage device: Secondary | ICD-10-CM | POA: Diagnosis not present

## 2019-05-19 DIAGNOSIS — I5023 Acute on chronic systolic (congestive) heart failure: Secondary | ICD-10-CM | POA: Diagnosis not present

## 2019-05-19 DIAGNOSIS — S91301A Unspecified open wound, right foot, initial encounter: Secondary | ICD-10-CM | POA: Diagnosis not present

## 2019-05-19 DIAGNOSIS — I429 Cardiomyopathy, unspecified: Secondary | ICD-10-CM | POA: Diagnosis not present

## 2019-05-19 DIAGNOSIS — I890 Lymphedema, not elsewhere classified: Secondary | ICD-10-CM | POA: Diagnosis not present

## 2019-05-19 DIAGNOSIS — I447 Left bundle-branch block, unspecified: Secondary | ICD-10-CM | POA: Diagnosis not present

## 2019-05-19 DIAGNOSIS — R5381 Other malaise: Secondary | ICD-10-CM | POA: Diagnosis not present

## 2019-05-19 DIAGNOSIS — Z79899 Other long term (current) drug therapy: Secondary | ICD-10-CM | POA: Diagnosis not present

## 2019-05-19 DIAGNOSIS — Z7982 Long term (current) use of aspirin: Secondary | ICD-10-CM | POA: Diagnosis not present

## 2019-05-19 DIAGNOSIS — E1142 Type 2 diabetes mellitus with diabetic polyneuropathy: Secondary | ICD-10-CM | POA: Diagnosis not present

## 2019-05-19 DIAGNOSIS — I5022 Chronic systolic (congestive) heart failure: Secondary | ICD-10-CM | POA: Diagnosis not present

## 2019-05-19 DIAGNOSIS — G910 Communicating hydrocephalus: Secondary | ICD-10-CM | POA: Diagnosis not present

## 2019-05-19 DIAGNOSIS — I110 Hypertensive heart disease with heart failure: Secondary | ICD-10-CM | POA: Diagnosis not present

## 2019-05-19 DIAGNOSIS — Z95810 Presence of automatic (implantable) cardiac defibrillator: Secondary | ICD-10-CM | POA: Diagnosis not present

## 2019-05-19 DIAGNOSIS — Z9981 Dependence on supplemental oxygen: Secondary | ICD-10-CM | POA: Diagnosis not present

## 2019-05-19 DIAGNOSIS — R29898 Other symptoms and signs involving the musculoskeletal system: Secondary | ICD-10-CM | POA: Diagnosis not present

## 2019-05-19 DIAGNOSIS — E1121 Type 2 diabetes mellitus with diabetic nephropathy: Secondary | ICD-10-CM | POA: Diagnosis not present

## 2019-05-19 DIAGNOSIS — Z7901 Long term (current) use of anticoagulants: Secondary | ICD-10-CM | POA: Diagnosis not present

## 2019-05-19 DIAGNOSIS — G4733 Obstructive sleep apnea (adult) (pediatric): Secondary | ICD-10-CM | POA: Diagnosis not present

## 2019-05-19 DIAGNOSIS — Z7409 Other reduced mobility: Secondary | ICD-10-CM | POA: Diagnosis not present

## 2019-05-19 DIAGNOSIS — G6181 Chronic inflammatory demyelinating polyneuritis: Secondary | ICD-10-CM | POA: Diagnosis not present

## 2019-05-19 DIAGNOSIS — Z794 Long term (current) use of insulin: Secondary | ICD-10-CM | POA: Diagnosis not present

## 2019-05-19 DIAGNOSIS — M1611 Unilateral primary osteoarthritis, right hip: Secondary | ICD-10-CM | POA: Diagnosis not present

## 2019-05-20 DIAGNOSIS — I429 Cardiomyopathy, unspecified: Secondary | ICD-10-CM | POA: Diagnosis not present

## 2019-05-20 DIAGNOSIS — I509 Heart failure, unspecified: Secondary | ICD-10-CM | POA: Diagnosis not present

## 2019-05-20 DIAGNOSIS — Z982 Presence of cerebrospinal fluid drainage device: Secondary | ICD-10-CM | POA: Diagnosis not present

## 2019-05-20 DIAGNOSIS — R5381 Other malaise: Secondary | ICD-10-CM | POA: Diagnosis not present

## 2019-05-20 DIAGNOSIS — Z794 Long term (current) use of insulin: Secondary | ICD-10-CM | POA: Diagnosis not present

## 2019-05-20 DIAGNOSIS — G6181 Chronic inflammatory demyelinating polyneuritis: Secondary | ICD-10-CM | POA: Diagnosis not present

## 2019-05-20 DIAGNOSIS — Z79899 Other long term (current) drug therapy: Secondary | ICD-10-CM | POA: Diagnosis not present

## 2019-05-20 DIAGNOSIS — M1611 Unilateral primary osteoarthritis, right hip: Secondary | ICD-10-CM | POA: Diagnosis not present

## 2019-05-20 DIAGNOSIS — G910 Communicating hydrocephalus: Secondary | ICD-10-CM | POA: Diagnosis not present

## 2019-05-20 DIAGNOSIS — E1142 Type 2 diabetes mellitus with diabetic polyneuropathy: Secondary | ICD-10-CM | POA: Diagnosis not present

## 2019-05-20 DIAGNOSIS — E1121 Type 2 diabetes mellitus with diabetic nephropathy: Secondary | ICD-10-CM | POA: Diagnosis not present

## 2019-05-20 DIAGNOSIS — Z7409 Other reduced mobility: Secondary | ICD-10-CM | POA: Diagnosis not present

## 2019-05-20 DIAGNOSIS — S91301A Unspecified open wound, right foot, initial encounter: Secondary | ICD-10-CM | POA: Diagnosis not present

## 2019-05-20 DIAGNOSIS — I890 Lymphedema, not elsewhere classified: Secondary | ICD-10-CM | POA: Diagnosis not present

## 2019-05-20 DIAGNOSIS — I480 Paroxysmal atrial fibrillation: Secondary | ICD-10-CM | POA: Diagnosis not present

## 2019-05-20 DIAGNOSIS — I5023 Acute on chronic systolic (congestive) heart failure: Secondary | ICD-10-CM | POA: Diagnosis not present

## 2019-05-20 DIAGNOSIS — I447 Left bundle-branch block, unspecified: Secondary | ICD-10-CM | POA: Diagnosis not present

## 2019-05-20 DIAGNOSIS — I110 Hypertensive heart disease with heart failure: Secondary | ICD-10-CM | POA: Diagnosis not present

## 2019-05-20 DIAGNOSIS — R29898 Other symptoms and signs involving the musculoskeletal system: Secondary | ICD-10-CM | POA: Diagnosis not present

## 2019-05-20 DIAGNOSIS — G4733 Obstructive sleep apnea (adult) (pediatric): Secondary | ICD-10-CM | POA: Diagnosis not present

## 2019-05-20 DIAGNOSIS — Z7982 Long term (current) use of aspirin: Secondary | ICD-10-CM | POA: Diagnosis not present

## 2019-05-20 DIAGNOSIS — Z7901 Long term (current) use of anticoagulants: Secondary | ICD-10-CM | POA: Diagnosis not present

## 2019-05-20 DIAGNOSIS — Z9981 Dependence on supplemental oxygen: Secondary | ICD-10-CM | POA: Diagnosis not present

## 2019-05-21 ENCOUNTER — Telehealth: Payer: Self-pay

## 2019-05-21 NOTE — Progress Notes (Signed)
EPIC Encounter for ICM Monitoring  Patient Name: Johnathan Wright is a 73 y.o. male Date: 05/21/2019 Primary Care Physican: Rochel Brome, MD Electrophysiologist: Curt Bears Weight: unknown Right Ventricular:  100% Left Ventricular:    100%                                                                                   Attempted call to patient and unable to reach.  Left message to return call. Transmission reviewed.  New DPR needed.   05/18/2019 HeartLogic Heart Failure Index is 9 suggesting fluid accumulation which is within normal threshold range.  Prescribed: Furosemide 20 mg take 1 tablet daily  Recommendations: Unable to reach.    Follow-up plan: ICM clinic phone appointment on 06/23/2019.         Copy of ICM check sent to Dr. Curt Bears.                  Rosalene Billings, RN 05/21/2019 8:43 AM

## 2019-05-21 NOTE — Telephone Encounter (Signed)
Remote ICM transmission received.  Attempted call to patient regarding ICM remote transmission and left message, per DPR, to return call.    

## 2019-05-22 DIAGNOSIS — Z01812 Encounter for preprocedural laboratory examination: Secondary | ICD-10-CM | POA: Diagnosis not present

## 2019-05-23 DIAGNOSIS — I429 Cardiomyopathy, unspecified: Secondary | ICD-10-CM | POA: Diagnosis not present

## 2019-05-23 DIAGNOSIS — Z7409 Other reduced mobility: Secondary | ICD-10-CM | POA: Diagnosis not present

## 2019-05-23 DIAGNOSIS — R5381 Other malaise: Secondary | ICD-10-CM | POA: Diagnosis not present

## 2019-05-23 DIAGNOSIS — G910 Communicating hydrocephalus: Secondary | ICD-10-CM | POA: Diagnosis not present

## 2019-05-23 DIAGNOSIS — R29898 Other symptoms and signs involving the musculoskeletal system: Secondary | ICD-10-CM | POA: Diagnosis not present

## 2019-05-23 DIAGNOSIS — I890 Lymphedema, not elsewhere classified: Secondary | ICD-10-CM | POA: Diagnosis not present

## 2019-05-23 DIAGNOSIS — Z7901 Long term (current) use of anticoagulants: Secondary | ICD-10-CM | POA: Diagnosis not present

## 2019-05-23 DIAGNOSIS — G4733 Obstructive sleep apnea (adult) (pediatric): Secondary | ICD-10-CM | POA: Diagnosis not present

## 2019-05-23 DIAGNOSIS — E1142 Type 2 diabetes mellitus with diabetic polyneuropathy: Secondary | ICD-10-CM | POA: Diagnosis not present

## 2019-05-23 DIAGNOSIS — E1121 Type 2 diabetes mellitus with diabetic nephropathy: Secondary | ICD-10-CM | POA: Diagnosis not present

## 2019-05-23 DIAGNOSIS — I5023 Acute on chronic systolic (congestive) heart failure: Secondary | ICD-10-CM | POA: Diagnosis not present

## 2019-05-23 DIAGNOSIS — I110 Hypertensive heart disease with heart failure: Secondary | ICD-10-CM | POA: Diagnosis not present

## 2019-05-23 DIAGNOSIS — G6181 Chronic inflammatory demyelinating polyneuritis: Secondary | ICD-10-CM | POA: Diagnosis not present

## 2019-05-23 DIAGNOSIS — Z982 Presence of cerebrospinal fluid drainage device: Secondary | ICD-10-CM | POA: Diagnosis not present

## 2019-05-23 DIAGNOSIS — Z794 Long term (current) use of insulin: Secondary | ICD-10-CM | POA: Diagnosis not present

## 2019-05-23 DIAGNOSIS — I480 Paroxysmal atrial fibrillation: Secondary | ICD-10-CM | POA: Diagnosis not present

## 2019-05-23 DIAGNOSIS — Z9981 Dependence on supplemental oxygen: Secondary | ICD-10-CM | POA: Diagnosis not present

## 2019-05-23 DIAGNOSIS — Z79899 Other long term (current) drug therapy: Secondary | ICD-10-CM | POA: Diagnosis not present

## 2019-05-23 DIAGNOSIS — S91301A Unspecified open wound, right foot, initial encounter: Secondary | ICD-10-CM | POA: Diagnosis not present

## 2019-05-23 DIAGNOSIS — I447 Left bundle-branch block, unspecified: Secondary | ICD-10-CM | POA: Diagnosis not present

## 2019-05-23 DIAGNOSIS — Z7982 Long term (current) use of aspirin: Secondary | ICD-10-CM | POA: Diagnosis not present

## 2019-05-23 DIAGNOSIS — M1611 Unilateral primary osteoarthritis, right hip: Secondary | ICD-10-CM | POA: Diagnosis not present

## 2019-05-26 DIAGNOSIS — R0902 Hypoxemia: Secondary | ICD-10-CM | POA: Diagnosis not present

## 2019-05-26 DIAGNOSIS — E1142 Type 2 diabetes mellitus with diabetic polyneuropathy: Secondary | ICD-10-CM | POA: Diagnosis not present

## 2019-05-26 DIAGNOSIS — D649 Anemia, unspecified: Secondary | ICD-10-CM | POA: Diagnosis not present

## 2019-05-26 DIAGNOSIS — E1165 Type 2 diabetes mellitus with hyperglycemia: Secondary | ICD-10-CM | POA: Diagnosis not present

## 2019-05-26 DIAGNOSIS — I959 Hypotension, unspecified: Secondary | ICD-10-CM | POA: Diagnosis not present

## 2019-05-26 DIAGNOSIS — Z95810 Presence of automatic (implantable) cardiac defibrillator: Secondary | ICD-10-CM | POA: Diagnosis not present

## 2019-05-26 DIAGNOSIS — G8929 Other chronic pain: Secondary | ICD-10-CM | POA: Diagnosis not present

## 2019-05-26 DIAGNOSIS — Z950 Presence of cardiac pacemaker: Secondary | ICD-10-CM | POA: Diagnosis not present

## 2019-05-26 DIAGNOSIS — Z79899 Other long term (current) drug therapy: Secondary | ICD-10-CM | POA: Diagnosis not present

## 2019-05-26 DIAGNOSIS — R279 Unspecified lack of coordination: Secondary | ICD-10-CM | POA: Diagnosis not present

## 2019-05-26 DIAGNOSIS — I5023 Acute on chronic systolic (congestive) heart failure: Secondary | ICD-10-CM | POA: Diagnosis not present

## 2019-05-26 DIAGNOSIS — E662 Morbid (severe) obesity with alveolar hypoventilation: Secondary | ICD-10-CM | POA: Diagnosis not present

## 2019-05-26 DIAGNOSIS — R0602 Shortness of breath: Secondary | ICD-10-CM | POA: Diagnosis not present

## 2019-05-26 DIAGNOSIS — R001 Bradycardia, unspecified: Secondary | ICD-10-CM | POA: Diagnosis not present

## 2019-05-26 DIAGNOSIS — Z87891 Personal history of nicotine dependence: Secondary | ICD-10-CM | POA: Diagnosis not present

## 2019-05-26 DIAGNOSIS — R269 Unspecified abnormalities of gait and mobility: Secondary | ICD-10-CM | POA: Diagnosis not present

## 2019-05-26 DIAGNOSIS — I10 Essential (primary) hypertension: Secondary | ICD-10-CM | POA: Diagnosis not present

## 2019-05-26 DIAGNOSIS — I429 Cardiomyopathy, unspecified: Secondary | ICD-10-CM | POA: Diagnosis not present

## 2019-05-26 DIAGNOSIS — I5022 Chronic systolic (congestive) heart failure: Secondary | ICD-10-CM | POA: Diagnosis not present

## 2019-05-26 DIAGNOSIS — G6181 Chronic inflammatory demyelinating polyneuritis: Secondary | ICD-10-CM | POA: Diagnosis not present

## 2019-05-26 DIAGNOSIS — I110 Hypertensive heart disease with heart failure: Secondary | ICD-10-CM | POA: Diagnosis not present

## 2019-05-26 DIAGNOSIS — R278 Other lack of coordination: Secondary | ICD-10-CM | POA: Diagnosis not present

## 2019-05-26 DIAGNOSIS — I509 Heart failure, unspecified: Secondary | ICD-10-CM | POA: Diagnosis not present

## 2019-05-26 DIAGNOSIS — I517 Cardiomegaly: Secondary | ICD-10-CM | POA: Diagnosis not present

## 2019-05-26 DIAGNOSIS — I872 Venous insufficiency (chronic) (peripheral): Secondary | ICD-10-CM | POA: Diagnosis not present

## 2019-05-26 DIAGNOSIS — R404 Transient alteration of awareness: Secondary | ICD-10-CM | POA: Diagnosis not present

## 2019-05-26 DIAGNOSIS — R262 Difficulty in walking, not elsewhere classified: Secondary | ICD-10-CM | POA: Diagnosis not present

## 2019-05-26 DIAGNOSIS — K5900 Constipation, unspecified: Secondary | ICD-10-CM | POA: Diagnosis not present

## 2019-05-26 DIAGNOSIS — M109 Gout, unspecified: Secondary | ICD-10-CM | POA: Diagnosis not present

## 2019-05-26 DIAGNOSIS — I2510 Atherosclerotic heart disease of native coronary artery without angina pectoris: Secondary | ICD-10-CM | POA: Diagnosis not present

## 2019-05-26 DIAGNOSIS — Z741 Need for assistance with personal care: Secondary | ICD-10-CM | POA: Diagnosis not present

## 2019-05-26 DIAGNOSIS — R531 Weakness: Secondary | ICD-10-CM | POA: Diagnosis not present

## 2019-05-26 DIAGNOSIS — Z86711 Personal history of pulmonary embolism: Secondary | ICD-10-CM | POA: Diagnosis not present

## 2019-05-26 DIAGNOSIS — Z89411 Acquired absence of right great toe: Secondary | ICD-10-CM | POA: Diagnosis not present

## 2019-05-26 DIAGNOSIS — I4891 Unspecified atrial fibrillation: Secondary | ICD-10-CM | POA: Diagnosis not present

## 2019-05-26 DIAGNOSIS — Z22322 Carrier or suspected carrier of Methicillin resistant Staphylococcus aureus: Secondary | ICD-10-CM | POA: Diagnosis not present

## 2019-05-26 DIAGNOSIS — M6281 Muscle weakness (generalized): Secondary | ICD-10-CM | POA: Diagnosis not present

## 2019-05-26 DIAGNOSIS — R339 Retention of urine, unspecified: Secondary | ICD-10-CM | POA: Diagnosis not present

## 2019-05-26 DIAGNOSIS — J9602 Acute respiratory failure with hypercapnia: Secondary | ICD-10-CM | POA: Diagnosis not present

## 2019-05-26 DIAGNOSIS — J189 Pneumonia, unspecified organism: Secondary | ICD-10-CM | POA: Diagnosis not present

## 2019-05-26 DIAGNOSIS — Z794 Long term (current) use of insulin: Secondary | ICD-10-CM | POA: Diagnosis not present

## 2019-05-26 DIAGNOSIS — Z7901 Long term (current) use of anticoagulants: Secondary | ICD-10-CM | POA: Diagnosis not present

## 2019-05-26 DIAGNOSIS — Z03818 Encounter for observation for suspected exposure to other biological agents ruled out: Secondary | ICD-10-CM | POA: Diagnosis not present

## 2019-05-26 DIAGNOSIS — J9601 Acute respiratory failure with hypoxia: Secondary | ICD-10-CM | POA: Diagnosis not present

## 2019-05-26 DIAGNOSIS — R918 Other nonspecific abnormal finding of lung field: Secondary | ICD-10-CM | POA: Diagnosis not present

## 2019-05-26 DIAGNOSIS — E785 Hyperlipidemia, unspecified: Secondary | ICD-10-CM | POA: Diagnosis not present

## 2019-05-26 DIAGNOSIS — S91301A Unspecified open wound, right foot, initial encounter: Secondary | ICD-10-CM | POA: Diagnosis not present

## 2019-05-26 DIAGNOSIS — G4730 Sleep apnea, unspecified: Secondary | ICD-10-CM | POA: Diagnosis not present

## 2019-05-27 DIAGNOSIS — I5023 Acute on chronic systolic (congestive) heart failure: Secondary | ICD-10-CM | POA: Diagnosis not present

## 2019-05-27 DIAGNOSIS — I110 Hypertensive heart disease with heart failure: Secondary | ICD-10-CM | POA: Diagnosis not present

## 2019-05-27 DIAGNOSIS — S91301A Unspecified open wound, right foot, initial encounter: Secondary | ICD-10-CM | POA: Diagnosis not present

## 2019-05-30 DIAGNOSIS — R262 Difficulty in walking, not elsewhere classified: Secondary | ICD-10-CM | POA: Diagnosis not present

## 2019-06-09 ENCOUNTER — Ambulatory Visit (INDEPENDENT_AMBULATORY_CARE_PROVIDER_SITE_OTHER): Payer: Medicare Other

## 2019-06-09 ENCOUNTER — Telehealth: Payer: Self-pay

## 2019-06-09 ENCOUNTER — Telehealth: Payer: Self-pay | Admitting: Cardiology

## 2019-06-09 DIAGNOSIS — Z95810 Presence of automatic (implantable) cardiac defibrillator: Secondary | ICD-10-CM

## 2019-06-09 DIAGNOSIS — I5022 Chronic systolic (congestive) heart failure: Secondary | ICD-10-CM

## 2019-06-09 NOTE — Progress Notes (Signed)
EPIC Encounter for ICM Monitoring  Patient Name: Johnathan Wright is a 73 y.o. male Date: 06/09/2019 Primary Care Physican: Rochel Brome, MD Primary Cardiologist: [facility] Electrophysiologist:[place] Weight:unknown Right Ventricular:100% Left Ventricular:100%  Attempted call to patient and unable to reach. Left message to return call. Transmission reviewed. New DPR needed.  HeartLogic Heart Failure Index: 06/08/2019 Index of 23 has crossed the alert threshold of 16 and suggesting possible fluid accumulation. 06/01/2019 Index of9   Prescribed: Furosemide20 mg take 1 tablet daily  Recommendations:Unable to reach.  Follow-up plan: ICM clinic phone appointment on8/31/2020 and will continue to monitor for alerts.         Copy of ICM check sent to Dr. Curt Bears and Dr Debara Pickett.    [place], RN 06/09/2019 2:03 PM

## 2019-06-09 NOTE — Telephone Encounter (Signed)
Remote ICM transmission received.  Attempted call to patient regarding ICM remote transmission and left message, per DPR, to return call.    

## 2019-06-09 NOTE — Telephone Encounter (Signed)
New message   Patient's caretaker states the patient is in [facility] rm 2218. She states that the device is with him in the nursing home. Please call  If need to discuss.

## 2019-06-10 NOTE — Telephone Encounter (Signed)
Forwarding to device & ICM clinics for their fyi

## 2019-06-10 NOTE — Telephone Encounter (Signed)
FYI. Thanks.

## 2019-06-13 ENCOUNTER — Telehealth: Payer: Self-pay

## 2019-06-13 NOTE — Progress Notes (Signed)
Received message patient is in [facility] Room 2218, phone [phone].

## 2019-06-13 NOTE — Telephone Encounter (Signed)
Remote ICM transmission received.  Attempted call to patient at [facility].  No answer and no option to leave voice mail message.

## 2019-06-13 NOTE — Progress Notes (Signed)
Attempted patient call at [facility] Room phone [phone] and reached voice mail.  No message left.

## 2019-06-17 NOTE — Progress Notes (Signed)
Attempted patient call at [facility] Room phone [phone] and no answer or voice mail box.

## 2019-06-17 NOTE — Progress Notes (Signed)
Received: 3 days ago [place], Ocie Doyne, MD  Short, Laurie Panda, RN        Will need BID lasix for one week.

## 2019-06-20 NOTE — Progress Notes (Signed)
[facility] requested to speak with nurse taking care of patient and no answer or option to leave voice mail message.

## 2019-06-20 NOTE — Progress Notes (Signed)
Attempted home number and no answer.

## 2019-06-23 ENCOUNTER — Telehealth: Payer: Self-pay

## 2019-06-23 ENCOUNTER — Ambulatory Visit (INDEPENDENT_AMBULATORY_CARE_PROVIDER_SITE_OTHER): Payer: Medicare Other

## 2019-06-23 DIAGNOSIS — Z95810 Presence of automatic (implantable) cardiac defibrillator: Secondary | ICD-10-CM

## 2019-06-23 DIAGNOSIS — I5022 Chronic systolic (congestive) heart failure: Secondary | ICD-10-CM | POA: Diagnosis not present

## 2019-06-23 NOTE — Telephone Encounter (Signed)
Attempted to call patient and nurse at [facility] Room phone [phone].  Received phone operator that said they cannot connect directly to a patient's room.  Transferred to patients nurse but no answer and call disconnected after numerous rings.

## 2019-06-23 NOTE — Progress Notes (Signed)
EPIC Encounter for ICM Monitoring  Patient Name: Johnathan Wright is a 73 y.o. male Date: 06/23/2019 Primary Care Physican: Rochel Brome, MD Primary Cardiologist:[place] Electrophysiologist:Camnitz Weight:unknown Right Ventricular:100% Left Ventricular:100%  Attempted call to patient and nurse at [facility] and phone operator cannot transfer to a patient room.  Transferred to patient's nurse and no answer and call disconnected after numerous rings.   HeartLogic Heart Failure Index: 06/22/2019 Index of 49 has crossed the alert threshold of 16 and suggesting possible fluid accumulation. 06/08/2019 Index of 23 has crossed the alert threshold of 16 and suggesting possible fluid accumulation. 06/01/2019 Index of9  Prescribed: Furosemide20 mg take 1 tablet daily  Recommendations:Unable to reach.Have not been able to provide Dr Camnitz's order from 8/25 to increase Furosemide to 20 mg bid.   Follow-up plan: ICM clinic phone appointment on9/05/2019 (manual send) to recheck fluid levels.  Will continue to monitor for alerts.  Copy of ICM check sent to Dr. Curt Bears and Dr Debara Pickett.   3 month ICM trend: 06/22/2019      1 Year ICM trend:       Rosalene Billings, RN 06/23/2019 3:14 PM

## 2019-06-24 NOTE — Progress Notes (Signed)
Attempted call to [facility] phone [phone] asked 2 phone operators to speak with the nurse taking care of patient. Call transferred and after numerous rings it disconnected.     Unable to reach facility nurse after numerous attempts in the last week.

## 2019-06-25 MED ORDER — FUROSEMIDE 20 MG PO TABS: 20.0000 mg | ORAL_TABLET | Freq: Two times a day (BID) | ORAL | 2 refills | Status: AC

## 2019-06-25 NOTE — Addendum Note (Signed)
Addended by: Stanton Kidney on: 06/25/2019 05:47 PM   Modules accepted: Orders

## 2019-06-25 NOTE — Telephone Encounter (Signed)
Reached pt's RN at facility. Spoke to [place], Therapist, sports.  She reports pt does not show any symptoms of volume overload/increased fluid.  The only issue is his O2 drops when he lays flat (this is not new).  She is not sure how long he will be at the facility. Confirms pt is taking Lasix 20 mg daily Advised to increase Lasix to 20 mg TWICE daily. Advised to call us if pt had weight gain greater than 3 pds overnight or 5 pds in a week. Patric Dykes, RN agreeable to plan.

## 2019-06-27 ENCOUNTER — Ambulatory Visit (INDEPENDENT_AMBULATORY_CARE_PROVIDER_SITE_OTHER): Payer: Medicare Other

## 2019-06-27 DIAGNOSIS — I5022 Chronic systolic (congestive) heart failure: Secondary | ICD-10-CM

## 2019-06-27 DIAGNOSIS — Z95810 Presence of automatic (implantable) cardiac defibrillator: Secondary | ICD-10-CM

## 2019-06-27 NOTE — Progress Notes (Signed)
[name] reached by Trinidad Curet, RN, Dr Macky Lower nurse.  See note below.     Johnathan Kidney, RN 5:47 PM Note   Reached pt's RN at facility. Spoke to [place], Therapist, sports.  She reports pt does not show any symptoms of volume overload/increased fluid.  The only issue is his O2 drops when he lays flat (this is not new).  She is not sure how long he will be at the facility. Confirms pt is taking Lasix 20 mg daily Advised to increase Lasix to 20 mg TWICE daily. Advised to call us if pt had weight gain greater than 3 pds overnight or 5 pds in a week. Johnathan Dykes, RN agreeable to plan.

## 2019-06-27 NOTE — Progress Notes (Signed)
EPIC Encounter for ICM Monitoring  Patient Name: Johnathan Wright is a 73 y.o. male Date: 06/27/2019 Primary Care Physican: Rochel Brome, MD Electrophysiologist:Camnitz Weight:unknown Right Ventricular:100% Left Ventricular:100%  No call to patient.   [name] was reached by Dr Macky Lower nurse, Trinidad Curet and advised to increase Lasix to 20 mg bid.    HeartLogic Heart Failure Index: 06/27/2019 Index of 45 and showing improvement since Lasix changed to bid on 06/25/2019. 06/22/2019 Index of 49 hascrossed the alert threshold of 16andsuggestingpossiblefluid accumulation. 06/08/2019 Index of 23 hascrossed the alert threshold of 16andsuggestingpossiblefluid accumulation. 06/01/2019 Index of9  Prescribed: Furosemide20 mg take 1 tablet twice a day  Recommendations: No recommendations needed since Lasix was just increased on 9/2.  Follow-up plan: ICM clinic phone appointment on9/14/202 to recheck fluid levels.  Will continue to monitor for alerts.  Copy of ICM check sent to Dr. Curt Bears  3 month ICM trend: 06/27/2019        Rosalene Billings, RN 06/27/2019 8:22 AM

## 2019-06-29 DIAGNOSIS — R5381 Other malaise: Secondary | ICD-10-CM | POA: Diagnosis not present

## 2019-06-29 DIAGNOSIS — G8929 Other chronic pain: Secondary | ICD-10-CM | POA: Diagnosis not present

## 2019-06-29 DIAGNOSIS — J9602 Acute respiratory failure with hypercapnia: Secondary | ICD-10-CM | POA: Diagnosis not present

## 2019-06-29 DIAGNOSIS — Z79899 Other long term (current) drug therapy: Secondary | ICD-10-CM | POA: Diagnosis not present

## 2019-06-29 DIAGNOSIS — R918 Other nonspecific abnormal finding of lung field: Secondary | ICD-10-CM | POA: Diagnosis not present

## 2019-06-29 DIAGNOSIS — Z87891 Personal history of nicotine dependence: Secondary | ICD-10-CM | POA: Diagnosis not present

## 2019-06-29 DIAGNOSIS — E1142 Type 2 diabetes mellitus with diabetic polyneuropathy: Secondary | ICD-10-CM | POA: Diagnosis not present

## 2019-06-29 DIAGNOSIS — E1165 Type 2 diabetes mellitus with hyperglycemia: Secondary | ICD-10-CM | POA: Diagnosis not present

## 2019-06-29 DIAGNOSIS — I429 Cardiomyopathy, unspecified: Secondary | ICD-10-CM | POA: Diagnosis not present

## 2019-06-29 DIAGNOSIS — Z950 Presence of cardiac pacemaker: Secondary | ICD-10-CM

## 2019-06-29 DIAGNOSIS — J9601 Acute respiratory failure with hypoxia: Secondary | ICD-10-CM | POA: Diagnosis not present

## 2019-06-29 DIAGNOSIS — Z86711 Personal history of pulmonary embolism: Secondary | ICD-10-CM | POA: Diagnosis not present

## 2019-06-29 DIAGNOSIS — R001 Bradycardia, unspecified: Secondary | ICD-10-CM | POA: Diagnosis not present

## 2019-06-29 DIAGNOSIS — I5023 Acute on chronic systolic (congestive) heart failure: Secondary | ICD-10-CM

## 2019-06-29 DIAGNOSIS — R404 Transient alteration of awareness: Secondary | ICD-10-CM | POA: Diagnosis not present

## 2019-06-29 DIAGNOSIS — K5900 Constipation, unspecified: Secondary | ICD-10-CM | POA: Diagnosis not present

## 2019-06-29 DIAGNOSIS — I2510 Atherosclerotic heart disease of native coronary artery without angina pectoris: Secondary | ICD-10-CM | POA: Diagnosis not present

## 2019-06-29 DIAGNOSIS — E785 Hyperlipidemia, unspecified: Secondary | ICD-10-CM | POA: Diagnosis not present

## 2019-06-29 DIAGNOSIS — Z7901 Long term (current) use of anticoagulants: Secondary | ICD-10-CM | POA: Diagnosis not present

## 2019-06-29 DIAGNOSIS — D649 Anemia, unspecified: Secondary | ICD-10-CM | POA: Diagnosis not present

## 2019-06-29 DIAGNOSIS — Z743 Need for continuous supervision: Secondary | ICD-10-CM | POA: Diagnosis not present

## 2019-06-29 DIAGNOSIS — Z89411 Acquired absence of right great toe: Secondary | ICD-10-CM | POA: Diagnosis not present

## 2019-06-29 DIAGNOSIS — Z794 Long term (current) use of insulin: Secondary | ICD-10-CM | POA: Diagnosis not present

## 2019-06-29 DIAGNOSIS — I10 Essential (primary) hypertension: Secondary | ICD-10-CM | POA: Diagnosis not present

## 2019-06-29 DIAGNOSIS — E662 Morbid (severe) obesity with alveolar hypoventilation: Secondary | ICD-10-CM | POA: Diagnosis not present

## 2019-06-29 DIAGNOSIS — Z9989 Dependence on other enabling machines and devices: Secondary | ICD-10-CM | POA: Diagnosis not present

## 2019-06-29 DIAGNOSIS — J9690 Respiratory failure, unspecified, unspecified whether with hypoxia or hypercapnia: Secondary | ICD-10-CM | POA: Diagnosis not present

## 2019-06-29 DIAGNOSIS — E559 Vitamin D deficiency, unspecified: Secondary | ICD-10-CM | POA: Diagnosis not present

## 2019-06-29 DIAGNOSIS — I110 Hypertensive heart disease with heart failure: Secondary | ICD-10-CM | POA: Diagnosis not present

## 2019-06-29 DIAGNOSIS — R0902 Hypoxemia: Secondary | ICD-10-CM | POA: Diagnosis not present

## 2019-06-29 DIAGNOSIS — G4733 Obstructive sleep apnea (adult) (pediatric): Secondary | ICD-10-CM | POA: Diagnosis not present

## 2019-06-29 DIAGNOSIS — G4730 Sleep apnea, unspecified: Secondary | ICD-10-CM | POA: Diagnosis not present

## 2019-06-29 DIAGNOSIS — R339 Retention of urine, unspecified: Secondary | ICD-10-CM | POA: Diagnosis not present

## 2019-06-29 DIAGNOSIS — I509 Heart failure, unspecified: Secondary | ICD-10-CM | POA: Diagnosis not present

## 2019-06-29 DIAGNOSIS — R0602 Shortness of breath: Secondary | ICD-10-CM | POA: Diagnosis not present

## 2019-06-29 DIAGNOSIS — G6181 Chronic inflammatory demyelinating polyneuritis: Secondary | ICD-10-CM | POA: Diagnosis not present

## 2019-06-29 DIAGNOSIS — Z22322 Carrier or suspected carrier of Methicillin resistant Staphylococcus aureus: Secondary | ICD-10-CM | POA: Diagnosis not present

## 2019-06-29 DIAGNOSIS — S91301D Unspecified open wound, right foot, subsequent encounter: Secondary | ICD-10-CM | POA: Diagnosis not present

## 2019-06-29 DIAGNOSIS — M109 Gout, unspecified: Secondary | ICD-10-CM | POA: Diagnosis not present

## 2019-06-29 DIAGNOSIS — I5040 Unspecified combined systolic (congestive) and diastolic (congestive) heart failure: Secondary | ICD-10-CM | POA: Diagnosis not present

## 2019-06-29 DIAGNOSIS — M6281 Muscle weakness (generalized): Secondary | ICD-10-CM | POA: Diagnosis not present

## 2019-06-29 DIAGNOSIS — R278 Other lack of coordination: Secondary | ICD-10-CM | POA: Diagnosis not present

## 2019-06-29 DIAGNOSIS — I4891 Unspecified atrial fibrillation: Secondary | ICD-10-CM | POA: Diagnosis not present

## 2019-06-29 DIAGNOSIS — I959 Hypotension, unspecified: Secondary | ICD-10-CM | POA: Diagnosis not present

## 2019-06-29 DIAGNOSIS — I872 Venous insufficiency (chronic) (peripheral): Secondary | ICD-10-CM | POA: Diagnosis not present

## 2019-06-29 DIAGNOSIS — Z741 Need for assistance with personal care: Secondary | ICD-10-CM | POA: Diagnosis not present

## 2019-06-29 DIAGNOSIS — J189 Pneumonia, unspecified organism: Secondary | ICD-10-CM | POA: Diagnosis not present

## 2019-06-29 DIAGNOSIS — R279 Unspecified lack of coordination: Secondary | ICD-10-CM | POA: Diagnosis not present

## 2019-06-30 DIAGNOSIS — G4730 Sleep apnea, unspecified: Secondary | ICD-10-CM | POA: Diagnosis not present

## 2019-06-30 DIAGNOSIS — J9601 Acute respiratory failure with hypoxia: Secondary | ICD-10-CM

## 2019-06-30 DIAGNOSIS — Z950 Presence of cardiac pacemaker: Secondary | ICD-10-CM

## 2019-06-30 DIAGNOSIS — J9602 Acute respiratory failure with hypercapnia: Secondary | ICD-10-CM | POA: Diagnosis not present

## 2019-06-30 DIAGNOSIS — I10 Essential (primary) hypertension: Secondary | ICD-10-CM | POA: Diagnosis not present

## 2019-06-30 DIAGNOSIS — D649 Anemia, unspecified: Secondary | ICD-10-CM | POA: Diagnosis not present

## 2019-06-30 DIAGNOSIS — J189 Pneumonia, unspecified organism: Secondary | ICD-10-CM | POA: Diagnosis not present

## 2019-06-30 DIAGNOSIS — I509 Heart failure, unspecified: Secondary | ICD-10-CM

## 2019-06-30 DIAGNOSIS — E1165 Type 2 diabetes mellitus with hyperglycemia: Secondary | ICD-10-CM | POA: Diagnosis not present

## 2019-07-01 DIAGNOSIS — D649 Anemia, unspecified: Secondary | ICD-10-CM | POA: Diagnosis not present

## 2019-07-01 DIAGNOSIS — I10 Essential (primary) hypertension: Secondary | ICD-10-CM | POA: Diagnosis not present

## 2019-07-01 DIAGNOSIS — I509 Heart failure, unspecified: Secondary | ICD-10-CM | POA: Diagnosis not present

## 2019-07-01 DIAGNOSIS — Z950 Presence of cardiac pacemaker: Secondary | ICD-10-CM | POA: Diagnosis not present

## 2019-07-01 DIAGNOSIS — E1165 Type 2 diabetes mellitus with hyperglycemia: Secondary | ICD-10-CM | POA: Diagnosis not present

## 2019-07-01 DIAGNOSIS — J9601 Acute respiratory failure with hypoxia: Secondary | ICD-10-CM | POA: Diagnosis not present

## 2019-07-02 DIAGNOSIS — I509 Heart failure, unspecified: Secondary | ICD-10-CM | POA: Diagnosis not present

## 2019-07-02 DIAGNOSIS — E1165 Type 2 diabetes mellitus with hyperglycemia: Secondary | ICD-10-CM | POA: Diagnosis not present

## 2019-07-02 DIAGNOSIS — D649 Anemia, unspecified: Secondary | ICD-10-CM | POA: Diagnosis not present

## 2019-07-02 DIAGNOSIS — I10 Essential (primary) hypertension: Secondary | ICD-10-CM | POA: Diagnosis not present

## 2019-07-02 DIAGNOSIS — J9601 Acute respiratory failure with hypoxia: Secondary | ICD-10-CM | POA: Diagnosis not present

## 2019-07-02 DIAGNOSIS — Z950 Presence of cardiac pacemaker: Secondary | ICD-10-CM | POA: Diagnosis not present

## 2019-07-03 DIAGNOSIS — Z950 Presence of cardiac pacemaker: Secondary | ICD-10-CM

## 2019-07-03 DIAGNOSIS — E1165 Type 2 diabetes mellitus with hyperglycemia: Secondary | ICD-10-CM | POA: Diagnosis not present

## 2019-07-03 DIAGNOSIS — I429 Cardiomyopathy, unspecified: Secondary | ICD-10-CM

## 2019-07-03 DIAGNOSIS — I10 Essential (primary) hypertension: Secondary | ICD-10-CM

## 2019-07-03 DIAGNOSIS — D649 Anemia, unspecified: Secondary | ICD-10-CM | POA: Diagnosis not present

## 2019-07-03 DIAGNOSIS — E662 Morbid (severe) obesity with alveolar hypoventilation: Secondary | ICD-10-CM

## 2019-07-03 DIAGNOSIS — J9601 Acute respiratory failure with hypoxia: Secondary | ICD-10-CM | POA: Diagnosis not present

## 2019-07-03 DIAGNOSIS — I509 Heart failure, unspecified: Secondary | ICD-10-CM

## 2019-07-04 DIAGNOSIS — E662 Morbid (severe) obesity with alveolar hypoventilation: Secondary | ICD-10-CM | POA: Diagnosis not present

## 2019-07-04 DIAGNOSIS — I509 Heart failure, unspecified: Secondary | ICD-10-CM | POA: Diagnosis not present

## 2019-07-04 DIAGNOSIS — E1165 Type 2 diabetes mellitus with hyperglycemia: Secondary | ICD-10-CM | POA: Diagnosis not present

## 2019-07-04 DIAGNOSIS — D649 Anemia, unspecified: Secondary | ICD-10-CM | POA: Diagnosis not present

## 2019-07-04 DIAGNOSIS — J9601 Acute respiratory failure with hypoxia: Secondary | ICD-10-CM | POA: Diagnosis not present

## 2019-07-04 DIAGNOSIS — I10 Essential (primary) hypertension: Secondary | ICD-10-CM | POA: Diagnosis not present

## 2019-07-04 DIAGNOSIS — I429 Cardiomyopathy, unspecified: Secondary | ICD-10-CM | POA: Diagnosis not present

## 2019-07-05 DIAGNOSIS — E1165 Type 2 diabetes mellitus with hyperglycemia: Secondary | ICD-10-CM | POA: Diagnosis not present

## 2019-07-05 DIAGNOSIS — J9601 Acute respiratory failure with hypoxia: Secondary | ICD-10-CM | POA: Diagnosis not present

## 2019-07-05 DIAGNOSIS — E662 Morbid (severe) obesity with alveolar hypoventilation: Secondary | ICD-10-CM | POA: Diagnosis not present

## 2019-07-05 DIAGNOSIS — I509 Heart failure, unspecified: Secondary | ICD-10-CM | POA: Diagnosis not present

## 2019-07-05 DIAGNOSIS — D649 Anemia, unspecified: Secondary | ICD-10-CM | POA: Diagnosis not present

## 2019-07-05 DIAGNOSIS — I429 Cardiomyopathy, unspecified: Secondary | ICD-10-CM | POA: Diagnosis not present

## 2019-07-05 DIAGNOSIS — I10 Essential (primary) hypertension: Secondary | ICD-10-CM | POA: Diagnosis not present

## 2019-07-06 DIAGNOSIS — I429 Cardiomyopathy, unspecified: Secondary | ICD-10-CM | POA: Diagnosis not present

## 2019-07-06 DIAGNOSIS — E662 Morbid (severe) obesity with alveolar hypoventilation: Secondary | ICD-10-CM | POA: Diagnosis not present

## 2019-07-06 DIAGNOSIS — I509 Heart failure, unspecified: Secondary | ICD-10-CM | POA: Diagnosis not present

## 2019-07-06 DIAGNOSIS — I10 Essential (primary) hypertension: Secondary | ICD-10-CM | POA: Diagnosis not present

## 2019-07-06 DIAGNOSIS — J9601 Acute respiratory failure with hypoxia: Secondary | ICD-10-CM | POA: Diagnosis not present

## 2019-07-06 DIAGNOSIS — E1165 Type 2 diabetes mellitus with hyperglycemia: Secondary | ICD-10-CM | POA: Diagnosis not present

## 2019-07-06 DIAGNOSIS — D649 Anemia, unspecified: Secondary | ICD-10-CM | POA: Diagnosis not present

## 2019-07-07 ENCOUNTER — Encounter: Payer: Medicare Other | Admitting: *Deleted

## 2019-07-07 DIAGNOSIS — J9601 Acute respiratory failure with hypoxia: Secondary | ICD-10-CM | POA: Diagnosis not present

## 2019-07-07 DIAGNOSIS — D649 Anemia, unspecified: Secondary | ICD-10-CM | POA: Diagnosis not present

## 2019-07-07 DIAGNOSIS — Z950 Presence of cardiac pacemaker: Secondary | ICD-10-CM | POA: Diagnosis not present

## 2019-07-07 DIAGNOSIS — E1165 Type 2 diabetes mellitus with hyperglycemia: Secondary | ICD-10-CM | POA: Diagnosis not present

## 2019-07-07 DIAGNOSIS — I509 Heart failure, unspecified: Secondary | ICD-10-CM | POA: Diagnosis not present

## 2019-07-07 DIAGNOSIS — I10 Essential (primary) hypertension: Secondary | ICD-10-CM | POA: Diagnosis not present

## 2019-07-08 ENCOUNTER — Telehealth: Payer: Self-pay

## 2019-07-08 DIAGNOSIS — Z950 Presence of cardiac pacemaker: Secondary | ICD-10-CM | POA: Diagnosis not present

## 2019-07-08 DIAGNOSIS — D649 Anemia, unspecified: Secondary | ICD-10-CM | POA: Diagnosis not present

## 2019-07-08 DIAGNOSIS — J9601 Acute respiratory failure with hypoxia: Secondary | ICD-10-CM | POA: Diagnosis not present

## 2019-07-08 DIAGNOSIS — I10 Essential (primary) hypertension: Secondary | ICD-10-CM | POA: Diagnosis not present

## 2019-07-08 DIAGNOSIS — I509 Heart failure, unspecified: Secondary | ICD-10-CM | POA: Diagnosis not present

## 2019-07-08 DIAGNOSIS — E1165 Type 2 diabetes mellitus with hyperglycemia: Secondary | ICD-10-CM | POA: Diagnosis not present

## 2019-07-08 NOTE — Telephone Encounter (Signed)
Left message for patient to remind of missed remote transmission.  

## 2019-07-09 DIAGNOSIS — E1142 Type 2 diabetes mellitus with diabetic polyneuropathy: Secondary | ICD-10-CM | POA: Diagnosis not present

## 2019-07-09 DIAGNOSIS — I5023 Acute on chronic systolic (congestive) heart failure: Secondary | ICD-10-CM | POA: Diagnosis not present

## 2019-07-09 DIAGNOSIS — I4891 Unspecified atrial fibrillation: Secondary | ICD-10-CM | POA: Diagnosis not present

## 2019-07-09 DIAGNOSIS — I081 Rheumatic disorders of both mitral and tricuspid valves: Secondary | ICD-10-CM | POA: Diagnosis not present

## 2019-07-09 DIAGNOSIS — J9622 Acute and chronic respiratory failure with hypercapnia: Secondary | ICD-10-CM | POA: Diagnosis not present

## 2019-07-09 DIAGNOSIS — Z79899 Other long term (current) drug therapy: Secondary | ICD-10-CM | POA: Diagnosis not present

## 2019-07-09 DIAGNOSIS — D649 Anemia, unspecified: Secondary | ICD-10-CM | POA: Diagnosis not present

## 2019-07-09 DIAGNOSIS — I10 Essential (primary) hypertension: Secondary | ICD-10-CM | POA: Diagnosis not present

## 2019-07-09 DIAGNOSIS — J9621 Acute and chronic respiratory failure with hypoxia: Secondary | ICD-10-CM | POA: Diagnosis not present

## 2019-07-09 DIAGNOSIS — E785 Hyperlipidemia, unspecified: Secondary | ICD-10-CM | POA: Diagnosis not present

## 2019-07-09 DIAGNOSIS — M6281 Muscle weakness (generalized): Secondary | ICD-10-CM | POA: Diagnosis not present

## 2019-07-09 DIAGNOSIS — I361 Nonrheumatic tricuspid (valve) insufficiency: Secondary | ICD-10-CM | POA: Diagnosis not present

## 2019-07-09 DIAGNOSIS — R5381 Other malaise: Secondary | ICD-10-CM | POA: Diagnosis not present

## 2019-07-09 DIAGNOSIS — M109 Gout, unspecified: Secondary | ICD-10-CM | POA: Diagnosis not present

## 2019-07-09 DIAGNOSIS — I872 Venous insufficiency (chronic) (peripheral): Secondary | ICD-10-CM | POA: Diagnosis not present

## 2019-07-09 DIAGNOSIS — I2510 Atherosclerotic heart disease of native coronary artery without angina pectoris: Secondary | ICD-10-CM | POA: Diagnosis not present

## 2019-07-09 DIAGNOSIS — Z741 Need for assistance with personal care: Secondary | ICD-10-CM | POA: Diagnosis not present

## 2019-07-09 DIAGNOSIS — Z2821 Immunization not carried out because of patient refusal: Secondary | ICD-10-CM | POA: Diagnosis not present

## 2019-07-09 DIAGNOSIS — E1165 Type 2 diabetes mellitus with hyperglycemia: Secondary | ICD-10-CM | POA: Diagnosis not present

## 2019-07-09 DIAGNOSIS — Z03818 Encounter for observation for suspected exposure to other biological agents ruled out: Secondary | ICD-10-CM | POA: Diagnosis not present

## 2019-07-09 DIAGNOSIS — E1169 Type 2 diabetes mellitus with other specified complication: Secondary | ICD-10-CM | POA: Diagnosis not present

## 2019-07-09 DIAGNOSIS — I509 Heart failure, unspecified: Secondary | ICD-10-CM | POA: Diagnosis not present

## 2019-07-09 DIAGNOSIS — R278 Other lack of coordination: Secondary | ICD-10-CM | POA: Diagnosis not present

## 2019-07-09 DIAGNOSIS — R0600 Dyspnea, unspecified: Secondary | ICD-10-CM | POA: Diagnosis not present

## 2019-07-09 DIAGNOSIS — Z87891 Personal history of nicotine dependence: Secondary | ICD-10-CM | POA: Diagnosis not present

## 2019-07-09 DIAGNOSIS — R279 Unspecified lack of coordination: Secondary | ICD-10-CM | POA: Diagnosis not present

## 2019-07-09 DIAGNOSIS — Z86711 Personal history of pulmonary embolism: Secondary | ICD-10-CM | POA: Diagnosis not present

## 2019-07-09 DIAGNOSIS — J9601 Acute respiratory failure with hypoxia: Secondary | ICD-10-CM | POA: Diagnosis not present

## 2019-07-09 DIAGNOSIS — G6181 Chronic inflammatory demyelinating polyneuritis: Secondary | ICD-10-CM | POA: Diagnosis not present

## 2019-07-09 DIAGNOSIS — J9610 Chronic respiratory failure, unspecified whether with hypoxia or hypercapnia: Secondary | ICD-10-CM | POA: Diagnosis not present

## 2019-07-09 DIAGNOSIS — Z7901 Long term (current) use of anticoagulants: Secondary | ICD-10-CM | POA: Diagnosis not present

## 2019-07-09 DIAGNOSIS — S91301D Unspecified open wound, right foot, subsequent encounter: Secondary | ICD-10-CM | POA: Diagnosis not present

## 2019-07-09 DIAGNOSIS — R069 Unspecified abnormalities of breathing: Secondary | ICD-10-CM | POA: Diagnosis not present

## 2019-07-09 DIAGNOSIS — K5900 Constipation, unspecified: Secondary | ICD-10-CM | POA: Diagnosis not present

## 2019-07-09 DIAGNOSIS — I5040 Unspecified combined systolic (congestive) and diastolic (congestive) heart failure: Secondary | ICD-10-CM | POA: Diagnosis not present

## 2019-07-09 DIAGNOSIS — Z950 Presence of cardiac pacemaker: Secondary | ICD-10-CM | POA: Diagnosis not present

## 2019-07-09 DIAGNOSIS — R0602 Shortness of breath: Secondary | ICD-10-CM | POA: Diagnosis not present

## 2019-07-09 DIAGNOSIS — Z743 Need for continuous supervision: Secondary | ICD-10-CM | POA: Diagnosis not present

## 2019-07-09 DIAGNOSIS — Z89411 Acquired absence of right great toe: Secondary | ICD-10-CM | POA: Diagnosis not present

## 2019-07-09 DIAGNOSIS — E559 Vitamin D deficiency, unspecified: Secondary | ICD-10-CM | POA: Diagnosis not present

## 2019-07-09 DIAGNOSIS — J189 Pneumonia, unspecified organism: Secondary | ICD-10-CM | POA: Diagnosis not present

## 2019-07-09 DIAGNOSIS — I110 Hypertensive heart disease with heart failure: Secondary | ICD-10-CM | POA: Diagnosis not present

## 2019-07-09 DIAGNOSIS — Z794 Long term (current) use of insulin: Secondary | ICD-10-CM | POA: Diagnosis not present

## 2019-07-09 DIAGNOSIS — G8929 Other chronic pain: Secondary | ICD-10-CM | POA: Diagnosis not present

## 2019-07-09 NOTE — Progress Notes (Signed)
No ICM remote transmission received for 07/07/2019 and next ICM transmission scheduled for 08/11/2019.

## 2019-07-10 DIAGNOSIS — Z03818 Encounter for observation for suspected exposure to other biological agents ruled out: Secondary | ICD-10-CM | POA: Diagnosis not present

## 2019-07-10 DIAGNOSIS — J9621 Acute and chronic respiratory failure with hypoxia: Secondary | ICD-10-CM | POA: Diagnosis not present

## 2019-07-10 DIAGNOSIS — I509 Heart failure, unspecified: Secondary | ICD-10-CM | POA: Diagnosis not present

## 2019-07-10 DIAGNOSIS — G8929 Other chronic pain: Secondary | ICD-10-CM | POA: Diagnosis not present

## 2019-07-10 DIAGNOSIS — E559 Vitamin D deficiency, unspecified: Secondary | ICD-10-CM | POA: Diagnosis not present

## 2019-07-10 DIAGNOSIS — Z79899 Other long term (current) drug therapy: Secondary | ICD-10-CM | POA: Diagnosis not present

## 2019-07-10 DIAGNOSIS — Z794 Long term (current) use of insulin: Secondary | ICD-10-CM | POA: Diagnosis not present

## 2019-07-10 DIAGNOSIS — E785 Hyperlipidemia, unspecified: Secondary | ICD-10-CM | POA: Diagnosis not present

## 2019-07-10 DIAGNOSIS — J9610 Chronic respiratory failure, unspecified whether with hypoxia or hypercapnia: Secondary | ICD-10-CM | POA: Diagnosis not present

## 2019-07-10 DIAGNOSIS — J9622 Acute and chronic respiratory failure with hypercapnia: Secondary | ICD-10-CM | POA: Diagnosis not present

## 2019-07-10 DIAGNOSIS — I361 Nonrheumatic tricuspid (valve) insufficiency: Secondary | ICD-10-CM | POA: Diagnosis not present

## 2019-07-10 DIAGNOSIS — Z89411 Acquired absence of right great toe: Secondary | ICD-10-CM | POA: Diagnosis not present

## 2019-07-10 DIAGNOSIS — G6181 Chronic inflammatory demyelinating polyneuritis: Secondary | ICD-10-CM | POA: Diagnosis not present

## 2019-07-10 DIAGNOSIS — Z7901 Long term (current) use of anticoagulants: Secondary | ICD-10-CM | POA: Diagnosis not present

## 2019-07-10 DIAGNOSIS — I5022 Chronic systolic (congestive) heart failure: Secondary | ICD-10-CM | POA: Diagnosis not present

## 2019-07-10 DIAGNOSIS — Z86711 Personal history of pulmonary embolism: Secondary | ICD-10-CM | POA: Diagnosis not present

## 2019-07-10 DIAGNOSIS — J9601 Acute respiratory failure with hypoxia: Secondary | ICD-10-CM | POA: Diagnosis not present

## 2019-07-10 DIAGNOSIS — I2510 Atherosclerotic heart disease of native coronary artery without angina pectoris: Secondary | ICD-10-CM | POA: Diagnosis not present

## 2019-07-10 DIAGNOSIS — E1142 Type 2 diabetes mellitus with diabetic polyneuropathy: Secondary | ICD-10-CM | POA: Diagnosis not present

## 2019-07-10 DIAGNOSIS — I4891 Unspecified atrial fibrillation: Secondary | ICD-10-CM | POA: Diagnosis not present

## 2019-07-10 DIAGNOSIS — E1169 Type 2 diabetes mellitus with other specified complication: Secondary | ICD-10-CM | POA: Diagnosis not present

## 2019-07-10 DIAGNOSIS — M6281 Muscle weakness (generalized): Secondary | ICD-10-CM | POA: Diagnosis not present

## 2019-07-10 DIAGNOSIS — I872 Venous insufficiency (chronic) (peripheral): Secondary | ICD-10-CM | POA: Diagnosis not present

## 2019-07-10 DIAGNOSIS — M109 Gout, unspecified: Secondary | ICD-10-CM | POA: Diagnosis not present

## 2019-07-10 DIAGNOSIS — J9600 Acute respiratory failure, unspecified whether with hypoxia or hypercapnia: Secondary | ICD-10-CM | POA: Diagnosis not present

## 2019-07-10 DIAGNOSIS — Z2821 Immunization not carried out because of patient refusal: Secondary | ICD-10-CM | POA: Diagnosis not present

## 2019-07-10 DIAGNOSIS — I110 Hypertensive heart disease with heart failure: Secondary | ICD-10-CM | POA: Diagnosis not present

## 2019-07-10 DIAGNOSIS — I081 Rheumatic disorders of both mitral and tricuspid valves: Secondary | ICD-10-CM | POA: Diagnosis not present

## 2019-07-10 DIAGNOSIS — J9602 Acute respiratory failure with hypercapnia: Secondary | ICD-10-CM | POA: Diagnosis not present

## 2019-07-10 DIAGNOSIS — I10 Essential (primary) hypertension: Secondary | ICD-10-CM | POA: Diagnosis not present

## 2019-07-10 DIAGNOSIS — M2550 Pain in unspecified joint: Secondary | ICD-10-CM | POA: Diagnosis not present

## 2019-07-10 DIAGNOSIS — R609 Edema, unspecified: Secondary | ICD-10-CM | POA: Diagnosis not present

## 2019-07-10 DIAGNOSIS — Z7401 Bed confinement status: Secondary | ICD-10-CM | POA: Diagnosis not present

## 2019-07-10 DIAGNOSIS — Z87891 Personal history of nicotine dependence: Secondary | ICD-10-CM | POA: Diagnosis not present

## 2019-07-10 DIAGNOSIS — Z950 Presence of cardiac pacemaker: Secondary | ICD-10-CM | POA: Diagnosis not present

## 2019-07-10 DIAGNOSIS — R0600 Dyspnea, unspecified: Secondary | ICD-10-CM | POA: Diagnosis not present

## 2019-07-10 DIAGNOSIS — R0602 Shortness of breath: Secondary | ICD-10-CM | POA: Diagnosis not present

## 2019-07-10 DIAGNOSIS — J189 Pneumonia, unspecified organism: Secondary | ICD-10-CM | POA: Diagnosis not present

## 2019-07-10 DIAGNOSIS — I5023 Acute on chronic systolic (congestive) heart failure: Secondary | ICD-10-CM | POA: Diagnosis not present

## 2019-07-10 DIAGNOSIS — R069 Unspecified abnormalities of breathing: Secondary | ICD-10-CM | POA: Diagnosis not present

## 2019-07-10 DIAGNOSIS — D649 Anemia, unspecified: Secondary | ICD-10-CM | POA: Diagnosis not present

## 2019-07-10 DIAGNOSIS — K5900 Constipation, unspecified: Secondary | ICD-10-CM | POA: Diagnosis not present

## 2019-07-11 DIAGNOSIS — J189 Pneumonia, unspecified organism: Secondary | ICD-10-CM | POA: Diagnosis not present

## 2019-07-11 DIAGNOSIS — J9601 Acute respiratory failure with hypoxia: Secondary | ICD-10-CM | POA: Diagnosis not present

## 2019-07-11 DIAGNOSIS — I361 Nonrheumatic tricuspid (valve) insufficiency: Secondary | ICD-10-CM

## 2019-07-11 DIAGNOSIS — E1169 Type 2 diabetes mellitus with other specified complication: Secondary | ICD-10-CM | POA: Diagnosis not present

## 2019-07-11 DIAGNOSIS — I5023 Acute on chronic systolic (congestive) heart failure: Secondary | ICD-10-CM | POA: Diagnosis not present

## 2019-07-12 DIAGNOSIS — J9601 Acute respiratory failure with hypoxia: Secondary | ICD-10-CM | POA: Diagnosis not present

## 2019-07-12 DIAGNOSIS — J189 Pneumonia, unspecified organism: Secondary | ICD-10-CM | POA: Diagnosis not present

## 2019-07-12 DIAGNOSIS — E1169 Type 2 diabetes mellitus with other specified complication: Secondary | ICD-10-CM | POA: Diagnosis not present

## 2019-07-12 DIAGNOSIS — I5023 Acute on chronic systolic (congestive) heart failure: Secondary | ICD-10-CM | POA: Diagnosis not present

## 2019-07-13 DIAGNOSIS — J9601 Acute respiratory failure with hypoxia: Secondary | ICD-10-CM | POA: Diagnosis not present

## 2019-07-13 DIAGNOSIS — E1169 Type 2 diabetes mellitus with other specified complication: Secondary | ICD-10-CM | POA: Diagnosis not present

## 2019-07-13 DIAGNOSIS — J189 Pneumonia, unspecified organism: Secondary | ICD-10-CM | POA: Diagnosis not present

## 2019-07-13 DIAGNOSIS — I5023 Acute on chronic systolic (congestive) heart failure: Secondary | ICD-10-CM | POA: Diagnosis not present

## 2019-07-14 DIAGNOSIS — J189 Pneumonia, unspecified organism: Secondary | ICD-10-CM | POA: Diagnosis not present

## 2019-07-14 DIAGNOSIS — J9601 Acute respiratory failure with hypoxia: Secondary | ICD-10-CM | POA: Diagnosis not present

## 2019-07-14 DIAGNOSIS — E1169 Type 2 diabetes mellitus with other specified complication: Secondary | ICD-10-CM | POA: Diagnosis not present

## 2019-07-14 DIAGNOSIS — I5023 Acute on chronic systolic (congestive) heart failure: Secondary | ICD-10-CM | POA: Diagnosis not present

## 2019-07-15 DIAGNOSIS — J189 Pneumonia, unspecified organism: Secondary | ICD-10-CM | POA: Diagnosis not present

## 2019-07-15 DIAGNOSIS — J9601 Acute respiratory failure with hypoxia: Secondary | ICD-10-CM | POA: Diagnosis not present

## 2019-07-15 DIAGNOSIS — I5023 Acute on chronic systolic (congestive) heart failure: Secondary | ICD-10-CM | POA: Diagnosis not present

## 2019-07-15 DIAGNOSIS — E1169 Type 2 diabetes mellitus with other specified complication: Secondary | ICD-10-CM | POA: Diagnosis not present

## 2019-07-16 ENCOUNTER — Ambulatory Visit (INDEPENDENT_AMBULATORY_CARE_PROVIDER_SITE_OTHER): Payer: Medicare Other | Admitting: *Deleted

## 2019-07-16 DIAGNOSIS — E559 Vitamin D deficiency, unspecified: Secondary | ICD-10-CM | POA: Diagnosis not present

## 2019-07-16 DIAGNOSIS — I4891 Unspecified atrial fibrillation: Secondary | ICD-10-CM | POA: Diagnosis not present

## 2019-07-16 DIAGNOSIS — R609 Edema, unspecified: Secondary | ICD-10-CM | POA: Diagnosis not present

## 2019-07-16 DIAGNOSIS — M6281 Muscle weakness (generalized): Secondary | ICD-10-CM | POA: Diagnosis not present

## 2019-07-16 DIAGNOSIS — Z7401 Bed confinement status: Secondary | ICD-10-CM | POA: Diagnosis not present

## 2019-07-16 DIAGNOSIS — M2550 Pain in unspecified joint: Secondary | ICD-10-CM | POA: Diagnosis not present

## 2019-07-16 DIAGNOSIS — Z950 Presence of cardiac pacemaker: Secondary | ICD-10-CM | POA: Diagnosis not present

## 2019-07-16 DIAGNOSIS — Z89411 Acquired absence of right great toe: Secondary | ICD-10-CM | POA: Diagnosis not present

## 2019-07-16 DIAGNOSIS — Z03818 Encounter for observation for suspected exposure to other biological agents ruled out: Secondary | ICD-10-CM | POA: Diagnosis not present

## 2019-07-16 DIAGNOSIS — I872 Venous insufficiency (chronic) (peripheral): Secondary | ICD-10-CM | POA: Diagnosis not present

## 2019-07-16 DIAGNOSIS — I5023 Acute on chronic systolic (congestive) heart failure: Secondary | ICD-10-CM | POA: Diagnosis not present

## 2019-07-16 DIAGNOSIS — Z95810 Presence of automatic (implantable) cardiac defibrillator: Secondary | ICD-10-CM | POA: Diagnosis not present

## 2019-07-16 DIAGNOSIS — K5900 Constipation, unspecified: Secondary | ICD-10-CM | POA: Diagnosis not present

## 2019-07-16 DIAGNOSIS — J9601 Acute respiratory failure with hypoxia: Secondary | ICD-10-CM | POA: Diagnosis not present

## 2019-07-16 DIAGNOSIS — J9602 Acute respiratory failure with hypercapnia: Secondary | ICD-10-CM | POA: Diagnosis not present

## 2019-07-16 DIAGNOSIS — J9600 Acute respiratory failure, unspecified whether with hypoxia or hypercapnia: Secondary | ICD-10-CM | POA: Diagnosis not present

## 2019-07-16 DIAGNOSIS — M109 Gout, unspecified: Secondary | ICD-10-CM | POA: Diagnosis not present

## 2019-07-16 DIAGNOSIS — I5022 Chronic systolic (congestive) heart failure: Secondary | ICD-10-CM

## 2019-07-16 DIAGNOSIS — D649 Anemia, unspecified: Secondary | ICD-10-CM | POA: Diagnosis not present

## 2019-07-16 DIAGNOSIS — E1169 Type 2 diabetes mellitus with other specified complication: Secondary | ICD-10-CM | POA: Diagnosis not present

## 2019-07-16 DIAGNOSIS — I2510 Atherosclerotic heart disease of native coronary artery without angina pectoris: Secondary | ICD-10-CM | POA: Diagnosis not present

## 2019-07-16 DIAGNOSIS — G8929 Other chronic pain: Secondary | ICD-10-CM | POA: Diagnosis not present

## 2019-07-16 DIAGNOSIS — G6181 Chronic inflammatory demyelinating polyneuritis: Secondary | ICD-10-CM | POA: Diagnosis not present

## 2019-07-16 DIAGNOSIS — I10 Essential (primary) hypertension: Secondary | ICD-10-CM | POA: Diagnosis not present

## 2019-07-16 DIAGNOSIS — J189 Pneumonia, unspecified organism: Secondary | ICD-10-CM | POA: Diagnosis not present

## 2019-07-16 DIAGNOSIS — E1142 Type 2 diabetes mellitus with diabetic polyneuropathy: Secondary | ICD-10-CM | POA: Diagnosis not present

## 2019-07-16 DIAGNOSIS — E785 Hyperlipidemia, unspecified: Secondary | ICD-10-CM | POA: Diagnosis not present

## 2019-07-17 LAB — CUP PACEART REMOTE DEVICE CHECK
Battery Remaining Longevity: 102 mo
Brady Statistic RA Percent Paced: 80 %
Brady Statistic RV Percent Paced: 100 %
Date Time Interrogation Session: 20200924075158
HighPow Impedance: 80 Ohm
Implantable Lead Implant Date: 20191118
Implantable Lead Implant Date: 20191118
Implantable Lead Implant Date: 20191118
Implantable Lead Location: 753858
Implantable Lead Location: 753859
Implantable Lead Location: 753860
Implantable Lead Model: 293
Implantable Lead Model: 4674
Implantable Lead Model: 7741
Implantable Lead Serial Number: 1025951
Implantable Lead Serial Number: 442535
Implantable Lead Serial Number: 833002
Implantable Pulse Generator Implant Date: 20191118
Lead Channel Impedance Value: 437 Ohm
Lead Channel Impedance Value: 612 Ohm
Lead Channel Impedance Value: 741 Ohm
Lead Channel Setting Pacing Amplitude: 3.5 V
Lead Channel Setting Pacing Amplitude: 3.5 V
Lead Channel Setting Pacing Amplitude: 3.5 V
Lead Channel Setting Pacing Pulse Width: 0.4 ms
Lead Channel Setting Pacing Pulse Width: 0.4 ms
Lead Channel Setting Sensing Sensitivity: 0.5 mV
Lead Channel Setting Sensing Sensitivity: 1 mV
Pulse Gen Serial Number: 219271

## 2019-07-22 ENCOUNTER — Encounter: Payer: Self-pay | Admitting: Cardiology

## 2019-07-22 NOTE — Progress Notes (Signed)
Remote ICD transmission.   

## 2019-07-23 DIAGNOSIS — G6181 Chronic inflammatory demyelinating polyneuritis: Secondary | ICD-10-CM | POA: Diagnosis not present

## 2019-08-11 ENCOUNTER — Ambulatory Visit (INDEPENDENT_AMBULATORY_CARE_PROVIDER_SITE_OTHER): Payer: Medicare Other

## 2019-08-11 DIAGNOSIS — Z95810 Presence of automatic (implantable) cardiac defibrillator: Secondary | ICD-10-CM | POA: Diagnosis not present

## 2019-08-11 DIAGNOSIS — I5022 Chronic systolic (congestive) heart failure: Secondary | ICD-10-CM | POA: Diagnosis not present

## 2019-08-15 ENCOUNTER — Telehealth: Payer: Self-pay

## 2019-08-15 NOTE — Telephone Encounter (Signed)
Remote ICM transmission received.  Attempted call to nurse at [facility], [phone] and unable to reach.  Also attempted patient's home number.

## 2019-08-15 NOTE — Progress Notes (Signed)
EPIC Encounter for ICM Monitoring  Patient Name: Johnathan Wright is a 73 y.o. male Date: 08/15/2019 Primary Care Physican: Rochel Brome, MD Primary Cardiologist:[place] Electrophysiologist:Camnitz Weight:unknown Right Ventricular:100% Left Ventricular:100%  Attempted call to patients nurse at [facility] Room phone [phone] and unable to reach.   HeartLogic Heart Failure Index: 08/12/2019 Index of 40 suggesting possible ongoing fluid accumulation.    Prescribed: Furosemide20 mg take 1 tablet twice a day  Recommendations: Unable to reach.    Follow-up plan: ICM clinic phone appointment on 08/25/2019 to recheck fluid levels and will monitor for alerts.   91 day device clinic remote transmission 10/15/2019.         Copy of ICM check sent to Dr. Curt Bears.    8 Day Data Trend            Rosalene Billings, RN 08/15/2019 12:07 PM

## 2019-08-21 DIAGNOSIS — E1142 Type 2 diabetes mellitus with diabetic polyneuropathy: Secondary | ICD-10-CM | POA: Diagnosis not present

## 2019-08-21 DIAGNOSIS — D649 Anemia, unspecified: Secondary | ICD-10-CM | POA: Diagnosis not present

## 2019-08-21 DIAGNOSIS — I4891 Unspecified atrial fibrillation: Secondary | ICD-10-CM | POA: Diagnosis not present

## 2019-08-21 DIAGNOSIS — I5032 Chronic diastolic (congestive) heart failure: Secondary | ICD-10-CM | POA: Diagnosis not present

## 2019-08-25 DIAGNOSIS — Z03818 Encounter for observation for suspected exposure to other biological agents ruled out: Secondary | ICD-10-CM | POA: Diagnosis not present

## 2019-08-26 ENCOUNTER — Telehealth: Payer: Self-pay

## 2019-08-26 NOTE — Telephone Encounter (Signed)
Left message for patient to remind of missed remote transmission.  

## 2019-08-29 NOTE — Progress Notes (Signed)
No ICM remote transmission received for 08/25/2019 and next ICM transmission scheduled for 09/29/2019.

## 2019-09-03 DIAGNOSIS — R0902 Hypoxemia: Secondary | ICD-10-CM | POA: Diagnosis not present

## 2019-09-04 DIAGNOSIS — J9602 Acute respiratory failure with hypercapnia: Secondary | ICD-10-CM | POA: Diagnosis not present

## 2019-09-04 DIAGNOSIS — D649 Anemia, unspecified: Secondary | ICD-10-CM | POA: Diagnosis not present

## 2019-09-08 DIAGNOSIS — J9602 Acute respiratory failure with hypercapnia: Secondary | ICD-10-CM | POA: Diagnosis not present

## 2019-09-08 DIAGNOSIS — I5023 Acute on chronic systolic (congestive) heart failure: Secondary | ICD-10-CM | POA: Diagnosis not present

## 2019-09-08 DIAGNOSIS — D649 Anemia, unspecified: Secondary | ICD-10-CM | POA: Diagnosis not present

## 2019-09-16 DIAGNOSIS — R279 Unspecified lack of coordination: Secondary | ICD-10-CM | POA: Diagnosis not present

## 2019-09-16 DIAGNOSIS — Z741 Need for assistance with personal care: Secondary | ICD-10-CM | POA: Diagnosis not present

## 2019-09-16 DIAGNOSIS — R278 Other lack of coordination: Secondary | ICD-10-CM | POA: Diagnosis not present

## 2019-09-16 DIAGNOSIS — R262 Difficulty in walking, not elsewhere classified: Secondary | ICD-10-CM | POA: Diagnosis not present

## 2019-09-16 DIAGNOSIS — R269 Unspecified abnormalities of gait and mobility: Secondary | ICD-10-CM | POA: Diagnosis not present

## 2019-09-16 DIAGNOSIS — J9602 Acute respiratory failure with hypercapnia: Secondary | ICD-10-CM | POA: Diagnosis not present

## 2019-09-22 DIAGNOSIS — I5032 Chronic diastolic (congestive) heart failure: Secondary | ICD-10-CM | POA: Diagnosis not present

## 2019-09-22 DIAGNOSIS — I4891 Unspecified atrial fibrillation: Secondary | ICD-10-CM | POA: Diagnosis not present

## 2019-09-23 DIAGNOSIS — R279 Unspecified lack of coordination: Secondary | ICD-10-CM | POA: Diagnosis not present

## 2019-09-23 DIAGNOSIS — Z741 Need for assistance with personal care: Secondary | ICD-10-CM | POA: Diagnosis not present

## 2019-09-23 DIAGNOSIS — R278 Other lack of coordination: Secondary | ICD-10-CM | POA: Diagnosis not present

## 2019-09-23 DIAGNOSIS — R262 Difficulty in walking, not elsewhere classified: Secondary | ICD-10-CM | POA: Diagnosis not present

## 2019-09-23 DIAGNOSIS — J9602 Acute respiratory failure with hypercapnia: Secondary | ICD-10-CM | POA: Diagnosis not present

## 2019-09-23 DIAGNOSIS — R269 Unspecified abnormalities of gait and mobility: Secondary | ICD-10-CM | POA: Diagnosis not present

## 2019-09-30 ENCOUNTER — Telehealth: Payer: Self-pay

## 2019-09-30 NOTE — Telephone Encounter (Signed)
Left message for patient to remind of missed remote transmission.  

## 2019-10-07 DIAGNOSIS — R269 Unspecified abnormalities of gait and mobility: Secondary | ICD-10-CM | POA: Diagnosis not present

## 2019-10-07 DIAGNOSIS — R262 Difficulty in walking, not elsewhere classified: Secondary | ICD-10-CM | POA: Diagnosis not present

## 2019-10-07 DIAGNOSIS — J9602 Acute respiratory failure with hypercapnia: Secondary | ICD-10-CM | POA: Diagnosis not present

## 2019-10-07 DIAGNOSIS — R279 Unspecified lack of coordination: Secondary | ICD-10-CM | POA: Diagnosis not present

## 2019-10-07 DIAGNOSIS — Z741 Need for assistance with personal care: Secondary | ICD-10-CM | POA: Diagnosis not present

## 2019-10-07 DIAGNOSIS — R278 Other lack of coordination: Secondary | ICD-10-CM | POA: Diagnosis not present

## 2019-10-10 DIAGNOSIS — J9602 Acute respiratory failure with hypercapnia: Secondary | ICD-10-CM | POA: Diagnosis not present

## 2019-10-10 DIAGNOSIS — R269 Unspecified abnormalities of gait and mobility: Secondary | ICD-10-CM | POA: Diagnosis not present

## 2019-10-10 DIAGNOSIS — Z741 Need for assistance with personal care: Secondary | ICD-10-CM | POA: Diagnosis not present

## 2019-10-10 DIAGNOSIS — R279 Unspecified lack of coordination: Secondary | ICD-10-CM | POA: Diagnosis not present

## 2019-10-10 DIAGNOSIS — R278 Other lack of coordination: Secondary | ICD-10-CM | POA: Diagnosis not present

## 2019-10-10 DIAGNOSIS — R262 Difficulty in walking, not elsewhere classified: Secondary | ICD-10-CM | POA: Diagnosis not present

## 2019-10-14 DIAGNOSIS — R279 Unspecified lack of coordination: Secondary | ICD-10-CM | POA: Diagnosis not present

## 2019-10-14 DIAGNOSIS — R278 Other lack of coordination: Secondary | ICD-10-CM | POA: Diagnosis not present

## 2019-10-14 DIAGNOSIS — J9602 Acute respiratory failure with hypercapnia: Secondary | ICD-10-CM | POA: Diagnosis not present

## 2019-10-14 DIAGNOSIS — R262 Difficulty in walking, not elsewhere classified: Secondary | ICD-10-CM | POA: Diagnosis not present

## 2019-10-14 DIAGNOSIS — R269 Unspecified abnormalities of gait and mobility: Secondary | ICD-10-CM | POA: Diagnosis not present

## 2019-10-14 DIAGNOSIS — Z741 Need for assistance with personal care: Secondary | ICD-10-CM | POA: Diagnosis not present

## 2019-10-16 DIAGNOSIS — R279 Unspecified lack of coordination: Secondary | ICD-10-CM | POA: Diagnosis not present

## 2019-10-16 DIAGNOSIS — R278 Other lack of coordination: Secondary | ICD-10-CM | POA: Diagnosis not present

## 2019-10-16 DIAGNOSIS — Z741 Need for assistance with personal care: Secondary | ICD-10-CM | POA: Diagnosis not present

## 2019-10-16 DIAGNOSIS — R262 Difficulty in walking, not elsewhere classified: Secondary | ICD-10-CM | POA: Diagnosis not present

## 2019-10-16 DIAGNOSIS — R269 Unspecified abnormalities of gait and mobility: Secondary | ICD-10-CM | POA: Diagnosis not present

## 2019-10-16 DIAGNOSIS — J9602 Acute respiratory failure with hypercapnia: Secondary | ICD-10-CM | POA: Diagnosis not present

## 2019-10-21 DIAGNOSIS — I5032 Chronic diastolic (congestive) heart failure: Secondary | ICD-10-CM | POA: Diagnosis not present

## 2019-10-21 DIAGNOSIS — E785 Hyperlipidemia, unspecified: Secondary | ICD-10-CM | POA: Diagnosis not present

## 2019-10-21 DIAGNOSIS — E1151 Type 2 diabetes mellitus with diabetic peripheral angiopathy without gangrene: Secondary | ICD-10-CM | POA: Diagnosis not present

## 2019-10-21 DIAGNOSIS — J9692 Respiratory failure, unspecified with hypercapnia: Secondary | ICD-10-CM | POA: Diagnosis not present

## 2019-10-21 DIAGNOSIS — D649 Anemia, unspecified: Secondary | ICD-10-CM | POA: Diagnosis not present

## 2019-10-28 NOTE — Progress Notes (Signed)
Unable to reach patient for monthly ICM follow ups.  Device clinic will continue 91 day remote transmissions per protocol.  No further ICM follow up scheduled at this time.

## 2019-11-05 DIAGNOSIS — R262 Difficulty in walking, not elsewhere classified: Secondary | ICD-10-CM | POA: Diagnosis not present

## 2019-11-05 DIAGNOSIS — R279 Unspecified lack of coordination: Secondary | ICD-10-CM | POA: Diagnosis not present

## 2019-11-05 DIAGNOSIS — R269 Unspecified abnormalities of gait and mobility: Secondary | ICD-10-CM | POA: Diagnosis not present

## 2019-11-05 DIAGNOSIS — Z741 Need for assistance with personal care: Secondary | ICD-10-CM | POA: Diagnosis not present

## 2019-11-05 DIAGNOSIS — R278 Other lack of coordination: Secondary | ICD-10-CM | POA: Diagnosis not present

## 2019-11-05 DIAGNOSIS — J9602 Acute respiratory failure with hypercapnia: Secondary | ICD-10-CM | POA: Diagnosis not present

## 2019-11-06 DIAGNOSIS — R269 Unspecified abnormalities of gait and mobility: Secondary | ICD-10-CM | POA: Diagnosis not present

## 2019-11-06 DIAGNOSIS — R278 Other lack of coordination: Secondary | ICD-10-CM | POA: Diagnosis not present

## 2019-11-06 DIAGNOSIS — R279 Unspecified lack of coordination: Secondary | ICD-10-CM | POA: Diagnosis not present

## 2019-11-06 DIAGNOSIS — J9602 Acute respiratory failure with hypercapnia: Secondary | ICD-10-CM | POA: Diagnosis not present

## 2019-11-06 DIAGNOSIS — R262 Difficulty in walking, not elsewhere classified: Secondary | ICD-10-CM | POA: Diagnosis not present

## 2019-11-06 DIAGNOSIS — Z741 Need for assistance with personal care: Secondary | ICD-10-CM | POA: Diagnosis not present

## 2019-11-10 DIAGNOSIS — R262 Difficulty in walking, not elsewhere classified: Secondary | ICD-10-CM | POA: Diagnosis not present

## 2019-11-10 DIAGNOSIS — Z741 Need for assistance with personal care: Secondary | ICD-10-CM | POA: Diagnosis not present

## 2019-11-10 DIAGNOSIS — R278 Other lack of coordination: Secondary | ICD-10-CM | POA: Diagnosis not present

## 2019-11-10 DIAGNOSIS — R279 Unspecified lack of coordination: Secondary | ICD-10-CM | POA: Diagnosis not present

## 2019-11-10 DIAGNOSIS — R269 Unspecified abnormalities of gait and mobility: Secondary | ICD-10-CM | POA: Diagnosis not present

## 2019-11-10 DIAGNOSIS — J9602 Acute respiratory failure with hypercapnia: Secondary | ICD-10-CM | POA: Diagnosis not present

## 2019-11-11 DIAGNOSIS — R278 Other lack of coordination: Secondary | ICD-10-CM | POA: Diagnosis not present

## 2019-11-11 DIAGNOSIS — J9602 Acute respiratory failure with hypercapnia: Secondary | ICD-10-CM | POA: Diagnosis not present

## 2019-11-11 DIAGNOSIS — R279 Unspecified lack of coordination: Secondary | ICD-10-CM | POA: Diagnosis not present

## 2019-11-11 DIAGNOSIS — Z741 Need for assistance with personal care: Secondary | ICD-10-CM | POA: Diagnosis not present

## 2019-11-11 DIAGNOSIS — R269 Unspecified abnormalities of gait and mobility: Secondary | ICD-10-CM | POA: Diagnosis not present

## 2019-11-11 DIAGNOSIS — R262 Difficulty in walking, not elsewhere classified: Secondary | ICD-10-CM | POA: Diagnosis not present

## 2019-11-12 DIAGNOSIS — R269 Unspecified abnormalities of gait and mobility: Secondary | ICD-10-CM | POA: Diagnosis not present

## 2019-11-12 DIAGNOSIS — R279 Unspecified lack of coordination: Secondary | ICD-10-CM | POA: Diagnosis not present

## 2019-11-12 DIAGNOSIS — Z741 Need for assistance with personal care: Secondary | ICD-10-CM | POA: Diagnosis not present

## 2019-11-12 DIAGNOSIS — R262 Difficulty in walking, not elsewhere classified: Secondary | ICD-10-CM | POA: Diagnosis not present

## 2019-11-12 DIAGNOSIS — J9602 Acute respiratory failure with hypercapnia: Secondary | ICD-10-CM | POA: Diagnosis not present

## 2019-11-12 DIAGNOSIS — R278 Other lack of coordination: Secondary | ICD-10-CM | POA: Diagnosis not present

## 2019-11-13 DIAGNOSIS — J9602 Acute respiratory failure with hypercapnia: Secondary | ICD-10-CM | POA: Diagnosis not present

## 2019-11-13 DIAGNOSIS — Z741 Need for assistance with personal care: Secondary | ICD-10-CM | POA: Diagnosis not present

## 2019-11-13 DIAGNOSIS — R278 Other lack of coordination: Secondary | ICD-10-CM | POA: Diagnosis not present

## 2019-11-13 DIAGNOSIS — R262 Difficulty in walking, not elsewhere classified: Secondary | ICD-10-CM | POA: Diagnosis not present

## 2019-11-13 DIAGNOSIS — R269 Unspecified abnormalities of gait and mobility: Secondary | ICD-10-CM | POA: Diagnosis not present

## 2019-11-13 DIAGNOSIS — R279 Unspecified lack of coordination: Secondary | ICD-10-CM | POA: Diagnosis not present

## 2019-11-14 DIAGNOSIS — R279 Unspecified lack of coordination: Secondary | ICD-10-CM | POA: Diagnosis not present

## 2019-11-14 DIAGNOSIS — J9602 Acute respiratory failure with hypercapnia: Secondary | ICD-10-CM | POA: Diagnosis not present

## 2019-11-14 DIAGNOSIS — R262 Difficulty in walking, not elsewhere classified: Secondary | ICD-10-CM | POA: Diagnosis not present

## 2019-11-14 DIAGNOSIS — R278 Other lack of coordination: Secondary | ICD-10-CM | POA: Diagnosis not present

## 2019-11-14 DIAGNOSIS — R269 Unspecified abnormalities of gait and mobility: Secondary | ICD-10-CM | POA: Diagnosis not present

## 2019-11-14 DIAGNOSIS — Z741 Need for assistance with personal care: Secondary | ICD-10-CM | POA: Diagnosis not present

## 2019-11-17 DIAGNOSIS — R262 Difficulty in walking, not elsewhere classified: Secondary | ICD-10-CM | POA: Diagnosis not present

## 2019-11-17 DIAGNOSIS — R279 Unspecified lack of coordination: Secondary | ICD-10-CM | POA: Diagnosis not present

## 2019-11-17 DIAGNOSIS — Z741 Need for assistance with personal care: Secondary | ICD-10-CM | POA: Diagnosis not present

## 2019-11-17 DIAGNOSIS — R269 Unspecified abnormalities of gait and mobility: Secondary | ICD-10-CM | POA: Diagnosis not present

## 2019-11-17 DIAGNOSIS — R278 Other lack of coordination: Secondary | ICD-10-CM | POA: Diagnosis not present

## 2019-11-17 DIAGNOSIS — J9602 Acute respiratory failure with hypercapnia: Secondary | ICD-10-CM | POA: Diagnosis not present

## 2019-11-18 DIAGNOSIS — R278 Other lack of coordination: Secondary | ICD-10-CM | POA: Diagnosis not present

## 2019-11-18 DIAGNOSIS — Z741 Need for assistance with personal care: Secondary | ICD-10-CM | POA: Diagnosis not present

## 2019-11-18 DIAGNOSIS — R279 Unspecified lack of coordination: Secondary | ICD-10-CM | POA: Diagnosis not present

## 2019-11-18 DIAGNOSIS — R262 Difficulty in walking, not elsewhere classified: Secondary | ICD-10-CM | POA: Diagnosis not present

## 2019-11-18 DIAGNOSIS — J9602 Acute respiratory failure with hypercapnia: Secondary | ICD-10-CM | POA: Diagnosis not present

## 2019-11-18 DIAGNOSIS — R269 Unspecified abnormalities of gait and mobility: Secondary | ICD-10-CM | POA: Diagnosis not present

## 2019-11-19 DIAGNOSIS — R279 Unspecified lack of coordination: Secondary | ICD-10-CM | POA: Diagnosis not present

## 2019-11-19 DIAGNOSIS — J9602 Acute respiratory failure with hypercapnia: Secondary | ICD-10-CM | POA: Diagnosis not present

## 2019-11-19 DIAGNOSIS — R278 Other lack of coordination: Secondary | ICD-10-CM | POA: Diagnosis not present

## 2019-11-19 DIAGNOSIS — R262 Difficulty in walking, not elsewhere classified: Secondary | ICD-10-CM | POA: Diagnosis not present

## 2019-11-19 DIAGNOSIS — R269 Unspecified abnormalities of gait and mobility: Secondary | ICD-10-CM | POA: Diagnosis not present

## 2019-11-19 DIAGNOSIS — Z741 Need for assistance with personal care: Secondary | ICD-10-CM | POA: Diagnosis not present

## 2019-11-20 DIAGNOSIS — R279 Unspecified lack of coordination: Secondary | ICD-10-CM | POA: Diagnosis not present

## 2019-11-20 DIAGNOSIS — J9602 Acute respiratory failure with hypercapnia: Secondary | ICD-10-CM | POA: Diagnosis not present

## 2019-11-20 DIAGNOSIS — Z741 Need for assistance with personal care: Secondary | ICD-10-CM | POA: Diagnosis not present

## 2019-11-20 DIAGNOSIS — R262 Difficulty in walking, not elsewhere classified: Secondary | ICD-10-CM | POA: Diagnosis not present

## 2019-11-20 DIAGNOSIS — R269 Unspecified abnormalities of gait and mobility: Secondary | ICD-10-CM | POA: Diagnosis not present

## 2019-11-20 DIAGNOSIS — R278 Other lack of coordination: Secondary | ICD-10-CM | POA: Diagnosis not present

## 2019-11-20 DIAGNOSIS — E1151 Type 2 diabetes mellitus with diabetic peripheral angiopathy without gangrene: Secondary | ICD-10-CM | POA: Diagnosis not present

## 2019-11-24 DIAGNOSIS — R279 Unspecified lack of coordination: Secondary | ICD-10-CM | POA: Diagnosis not present

## 2019-11-24 DIAGNOSIS — R269 Unspecified abnormalities of gait and mobility: Secondary | ICD-10-CM | POA: Diagnosis not present

## 2019-11-24 DIAGNOSIS — J9602 Acute respiratory failure with hypercapnia: Secondary | ICD-10-CM | POA: Diagnosis not present

## 2019-11-24 DIAGNOSIS — R262 Difficulty in walking, not elsewhere classified: Secondary | ICD-10-CM | POA: Diagnosis not present

## 2019-11-24 DIAGNOSIS — Z741 Need for assistance with personal care: Secondary | ICD-10-CM | POA: Diagnosis not present

## 2019-11-24 DIAGNOSIS — R278 Other lack of coordination: Secondary | ICD-10-CM | POA: Diagnosis not present

## 2019-11-25 DIAGNOSIS — R262 Difficulty in walking, not elsewhere classified: Secondary | ICD-10-CM | POA: Diagnosis not present

## 2019-11-25 DIAGNOSIS — J9602 Acute respiratory failure with hypercapnia: Secondary | ICD-10-CM | POA: Diagnosis not present

## 2019-11-25 DIAGNOSIS — R279 Unspecified lack of coordination: Secondary | ICD-10-CM | POA: Diagnosis not present

## 2019-11-25 DIAGNOSIS — Z741 Need for assistance with personal care: Secondary | ICD-10-CM | POA: Diagnosis not present

## 2019-11-25 DIAGNOSIS — R278 Other lack of coordination: Secondary | ICD-10-CM | POA: Diagnosis not present

## 2019-11-25 DIAGNOSIS — R269 Unspecified abnormalities of gait and mobility: Secondary | ICD-10-CM | POA: Diagnosis not present

## 2019-11-26 DIAGNOSIS — R278 Other lack of coordination: Secondary | ICD-10-CM | POA: Diagnosis not present

## 2019-11-26 DIAGNOSIS — R262 Difficulty in walking, not elsewhere classified: Secondary | ICD-10-CM | POA: Diagnosis not present

## 2019-11-26 DIAGNOSIS — R279 Unspecified lack of coordination: Secondary | ICD-10-CM | POA: Diagnosis not present

## 2019-11-26 DIAGNOSIS — J9602 Acute respiratory failure with hypercapnia: Secondary | ICD-10-CM | POA: Diagnosis not present

## 2019-11-26 DIAGNOSIS — Z741 Need for assistance with personal care: Secondary | ICD-10-CM | POA: Diagnosis not present

## 2019-11-26 DIAGNOSIS — R269 Unspecified abnormalities of gait and mobility: Secondary | ICD-10-CM | POA: Diagnosis not present

## 2019-11-27 DIAGNOSIS — R262 Difficulty in walking, not elsewhere classified: Secondary | ICD-10-CM | POA: Diagnosis not present

## 2019-11-27 DIAGNOSIS — J9602 Acute respiratory failure with hypercapnia: Secondary | ICD-10-CM | POA: Diagnosis not present

## 2019-11-27 DIAGNOSIS — R278 Other lack of coordination: Secondary | ICD-10-CM | POA: Diagnosis not present

## 2019-11-27 DIAGNOSIS — R269 Unspecified abnormalities of gait and mobility: Secondary | ICD-10-CM | POA: Diagnosis not present

## 2019-11-27 DIAGNOSIS — R279 Unspecified lack of coordination: Secondary | ICD-10-CM | POA: Diagnosis not present

## 2019-11-27 DIAGNOSIS — Z741 Need for assistance with personal care: Secondary | ICD-10-CM | POA: Diagnosis not present

## 2019-11-28 DIAGNOSIS — J9602 Acute respiratory failure with hypercapnia: Secondary | ICD-10-CM | POA: Diagnosis not present

## 2019-11-28 DIAGNOSIS — R262 Difficulty in walking, not elsewhere classified: Secondary | ICD-10-CM | POA: Diagnosis not present

## 2019-11-28 DIAGNOSIS — R279 Unspecified lack of coordination: Secondary | ICD-10-CM | POA: Diagnosis not present

## 2019-11-28 DIAGNOSIS — R269 Unspecified abnormalities of gait and mobility: Secondary | ICD-10-CM | POA: Diagnosis not present

## 2019-11-28 DIAGNOSIS — R278 Other lack of coordination: Secondary | ICD-10-CM | POA: Diagnosis not present

## 2019-11-28 DIAGNOSIS — Z741 Need for assistance with personal care: Secondary | ICD-10-CM | POA: Diagnosis not present

## 2019-12-01 DIAGNOSIS — R278 Other lack of coordination: Secondary | ICD-10-CM | POA: Diagnosis not present

## 2019-12-01 DIAGNOSIS — R262 Difficulty in walking, not elsewhere classified: Secondary | ICD-10-CM | POA: Diagnosis not present

## 2019-12-01 DIAGNOSIS — Z741 Need for assistance with personal care: Secondary | ICD-10-CM | POA: Diagnosis not present

## 2019-12-01 DIAGNOSIS — R279 Unspecified lack of coordination: Secondary | ICD-10-CM | POA: Diagnosis not present

## 2019-12-01 DIAGNOSIS — R269 Unspecified abnormalities of gait and mobility: Secondary | ICD-10-CM | POA: Diagnosis not present

## 2019-12-01 DIAGNOSIS — J9602 Acute respiratory failure with hypercapnia: Secondary | ICD-10-CM | POA: Diagnosis not present

## 2019-12-02 DIAGNOSIS — R278 Other lack of coordination: Secondary | ICD-10-CM | POA: Diagnosis not present

## 2019-12-02 DIAGNOSIS — R269 Unspecified abnormalities of gait and mobility: Secondary | ICD-10-CM | POA: Diagnosis not present

## 2019-12-02 DIAGNOSIS — R279 Unspecified lack of coordination: Secondary | ICD-10-CM | POA: Diagnosis not present

## 2019-12-02 DIAGNOSIS — Z741 Need for assistance with personal care: Secondary | ICD-10-CM | POA: Diagnosis not present

## 2019-12-02 DIAGNOSIS — J9602 Acute respiratory failure with hypercapnia: Secondary | ICD-10-CM | POA: Diagnosis not present

## 2019-12-02 DIAGNOSIS — R262 Difficulty in walking, not elsewhere classified: Secondary | ICD-10-CM | POA: Diagnosis not present

## 2019-12-03 ENCOUNTER — Telehealth: Payer: Self-pay

## 2019-12-03 DIAGNOSIS — R262 Difficulty in walking, not elsewhere classified: Secondary | ICD-10-CM | POA: Diagnosis not present

## 2019-12-03 DIAGNOSIS — R278 Other lack of coordination: Secondary | ICD-10-CM | POA: Diagnosis not present

## 2019-12-03 DIAGNOSIS — R269 Unspecified abnormalities of gait and mobility: Secondary | ICD-10-CM | POA: Diagnosis not present

## 2019-12-03 DIAGNOSIS — J9602 Acute respiratory failure with hypercapnia: Secondary | ICD-10-CM | POA: Diagnosis not present

## 2019-12-03 DIAGNOSIS — R279 Unspecified lack of coordination: Secondary | ICD-10-CM | POA: Diagnosis not present

## 2019-12-03 DIAGNOSIS — Z741 Need for assistance with personal care: Secondary | ICD-10-CM | POA: Diagnosis not present

## 2019-12-03 NOTE — Telephone Encounter (Signed)
The nurse at [facility] called to schedule the pt a home remote appointment with his home monitor. I gave them the number to [employer] tech support to get additional help.

## 2019-12-04 ENCOUNTER — Telehealth: Payer: Self-pay

## 2019-12-04 NOTE — Telephone Encounter (Signed)
Johnathan Wright states they can not find the pt BSX monitor. I told her I will reach out to the rep and order him a new monitor with a cell adapter. She gave me an updated address to make sure he receive the monitor.

## 2019-12-09 NOTE — Telephone Encounter (Signed)
Johnathan Wright dropped off the new monitor and the pt sent a successful transmission.

## 2019-12-15 DIAGNOSIS — R918 Other nonspecific abnormal finding of lung field: Secondary | ICD-10-CM | POA: Diagnosis not present

## 2019-12-15 DIAGNOSIS — I517 Cardiomegaly: Secondary | ICD-10-CM | POA: Diagnosis not present

## 2019-12-18 DIAGNOSIS — Z66 Do not resuscitate: Secondary | ICD-10-CM | POA: Diagnosis not present

## 2019-12-18 DIAGNOSIS — I5023 Acute on chronic systolic (congestive) heart failure: Secondary | ICD-10-CM | POA: Diagnosis not present

## 2019-12-18 DIAGNOSIS — J9621 Acute and chronic respiratory failure with hypoxia: Secondary | ICD-10-CM | POA: Diagnosis not present

## 2019-12-18 DIAGNOSIS — J9690 Respiratory failure, unspecified, unspecified whether with hypoxia or hypercapnia: Secondary | ICD-10-CM | POA: Diagnosis not present

## 2019-12-18 DIAGNOSIS — I5022 Chronic systolic (congestive) heart failure: Secondary | ICD-10-CM | POA: Diagnosis not present

## 2019-12-18 DIAGNOSIS — I959 Hypotension, unspecified: Secondary | ICD-10-CM | POA: Diagnosis not present

## 2019-12-18 DIAGNOSIS — D649 Anemia, unspecified: Secondary | ICD-10-CM | POA: Diagnosis not present

## 2019-12-18 DIAGNOSIS — Z79899 Other long term (current) drug therapy: Secondary | ICD-10-CM | POA: Diagnosis not present

## 2019-12-18 DIAGNOSIS — Z89411 Acquired absence of right great toe: Secondary | ICD-10-CM | POA: Diagnosis not present

## 2019-12-18 DIAGNOSIS — R0602 Shortness of breath: Secondary | ICD-10-CM | POA: Diagnosis not present

## 2019-12-18 DIAGNOSIS — R262 Difficulty in walking, not elsewhere classified: Secondary | ICD-10-CM | POA: Diagnosis not present

## 2019-12-18 DIAGNOSIS — Z950 Presence of cardiac pacemaker: Secondary | ICD-10-CM | POA: Diagnosis not present

## 2019-12-18 DIAGNOSIS — J9600 Acute respiratory failure, unspecified whether with hypoxia or hypercapnia: Secondary | ICD-10-CM | POA: Diagnosis not present

## 2019-12-18 DIAGNOSIS — E1151 Type 2 diabetes mellitus with diabetic peripheral angiopathy without gangrene: Secondary | ICD-10-CM | POA: Diagnosis not present

## 2019-12-18 DIAGNOSIS — R278 Other lack of coordination: Secondary | ICD-10-CM | POA: Diagnosis not present

## 2019-12-18 DIAGNOSIS — M6281 Muscle weakness (generalized): Secondary | ICD-10-CM | POA: Diagnosis not present

## 2019-12-18 DIAGNOSIS — E785 Hyperlipidemia, unspecified: Secondary | ICD-10-CM | POA: Diagnosis not present

## 2019-12-18 DIAGNOSIS — M2550 Pain in unspecified joint: Secondary | ICD-10-CM | POA: Diagnosis not present

## 2019-12-18 DIAGNOSIS — Z794 Long term (current) use of insulin: Secondary | ICD-10-CM | POA: Diagnosis not present

## 2019-12-18 DIAGNOSIS — I509 Heart failure, unspecified: Secondary | ICD-10-CM | POA: Diagnosis not present

## 2019-12-18 DIAGNOSIS — J449 Chronic obstructive pulmonary disease, unspecified: Secondary | ICD-10-CM | POA: Diagnosis not present

## 2019-12-18 DIAGNOSIS — E1169 Type 2 diabetes mellitus with other specified complication: Secondary | ICD-10-CM | POA: Diagnosis not present

## 2019-12-18 DIAGNOSIS — Z87891 Personal history of nicotine dependence: Secondary | ICD-10-CM | POA: Diagnosis not present

## 2019-12-18 DIAGNOSIS — I10 Essential (primary) hypertension: Secondary | ICD-10-CM | POA: Diagnosis not present

## 2019-12-18 DIAGNOSIS — E1142 Type 2 diabetes mellitus with diabetic polyneuropathy: Secondary | ICD-10-CM | POA: Diagnosis not present

## 2019-12-18 DIAGNOSIS — I2510 Atherosclerotic heart disease of native coronary artery without angina pectoris: Secondary | ICD-10-CM | POA: Diagnosis not present

## 2019-12-18 DIAGNOSIS — J9601 Acute respiratory failure with hypoxia: Secondary | ICD-10-CM | POA: Diagnosis not present

## 2019-12-18 DIAGNOSIS — Z7401 Bed confinement status: Secondary | ICD-10-CM | POA: Diagnosis not present

## 2019-12-18 DIAGNOSIS — M109 Gout, unspecified: Secondary | ICD-10-CM | POA: Diagnosis not present

## 2019-12-18 DIAGNOSIS — J9622 Acute and chronic respiratory failure with hypercapnia: Secondary | ICD-10-CM | POA: Diagnosis not present

## 2019-12-18 DIAGNOSIS — Z23 Encounter for immunization: Secondary | ICD-10-CM | POA: Diagnosis not present

## 2019-12-18 DIAGNOSIS — J9602 Acute respiratory failure with hypercapnia: Secondary | ICD-10-CM | POA: Diagnosis not present

## 2019-12-18 DIAGNOSIS — Z7902 Long term (current) use of antithrombotics/antiplatelets: Secondary | ICD-10-CM | POA: Diagnosis not present

## 2019-12-18 DIAGNOSIS — Z741 Need for assistance with personal care: Secondary | ICD-10-CM | POA: Diagnosis not present

## 2019-12-18 DIAGNOSIS — R778 Other specified abnormalities of plasma proteins: Secondary | ICD-10-CM | POA: Diagnosis not present

## 2019-12-18 DIAGNOSIS — I110 Hypertensive heart disease with heart failure: Secondary | ICD-10-CM | POA: Diagnosis not present

## 2019-12-18 DIAGNOSIS — R293 Abnormal posture: Secondary | ICD-10-CM | POA: Diagnosis not present

## 2019-12-18 DIAGNOSIS — G6181 Chronic inflammatory demyelinating polyneuritis: Secondary | ICD-10-CM | POA: Diagnosis not present

## 2019-12-18 DIAGNOSIS — J9691 Respiratory failure, unspecified with hypoxia: Secondary | ICD-10-CM | POA: Diagnosis not present

## 2019-12-18 DIAGNOSIS — Z888 Allergy status to other drugs, medicaments and biological substances status: Secondary | ICD-10-CM | POA: Diagnosis not present

## 2019-12-18 DIAGNOSIS — E7800 Pure hypercholesterolemia, unspecified: Secondary | ICD-10-CM | POA: Diagnosis not present

## 2019-12-18 DIAGNOSIS — K5900 Constipation, unspecified: Secondary | ICD-10-CM | POA: Diagnosis not present

## 2019-12-18 DIAGNOSIS — R609 Edema, unspecified: Secondary | ICD-10-CM | POA: Diagnosis not present

## 2019-12-18 DIAGNOSIS — Z86711 Personal history of pulmonary embolism: Secondary | ICD-10-CM | POA: Diagnosis not present

## 2019-12-18 DIAGNOSIS — R0902 Hypoxemia: Secondary | ICD-10-CM | POA: Diagnosis not present

## 2019-12-18 DIAGNOSIS — J80 Acute respiratory distress syndrome: Secondary | ICD-10-CM | POA: Diagnosis not present

## 2019-12-18 DIAGNOSIS — J189 Pneumonia, unspecified organism: Secondary | ICD-10-CM | POA: Diagnosis not present

## 2019-12-18 DIAGNOSIS — I4891 Unspecified atrial fibrillation: Secondary | ICD-10-CM | POA: Diagnosis not present

## 2019-12-18 DIAGNOSIS — Z9119 Patient's noncompliance with other medical treatment and regimen: Secondary | ICD-10-CM | POA: Diagnosis not present

## 2019-12-18 DIAGNOSIS — G8929 Other chronic pain: Secondary | ICD-10-CM | POA: Diagnosis not present

## 2019-12-18 DIAGNOSIS — J441 Chronic obstructive pulmonary disease with (acute) exacerbation: Secondary | ICD-10-CM | POA: Diagnosis not present

## 2019-12-19 DIAGNOSIS — I10 Essential (primary) hypertension: Secondary | ICD-10-CM

## 2019-12-19 DIAGNOSIS — J9691 Respiratory failure, unspecified with hypoxia: Secondary | ICD-10-CM

## 2019-12-19 DIAGNOSIS — D649 Anemia, unspecified: Secondary | ICD-10-CM

## 2019-12-19 DIAGNOSIS — I509 Heart failure, unspecified: Secondary | ICD-10-CM

## 2019-12-19 DIAGNOSIS — J449 Chronic obstructive pulmonary disease, unspecified: Secondary | ICD-10-CM

## 2019-12-19 DIAGNOSIS — E785 Hyperlipidemia, unspecified: Secondary | ICD-10-CM

## 2019-12-19 DIAGNOSIS — J441 Chronic obstructive pulmonary disease with (acute) exacerbation: Secondary | ICD-10-CM

## 2019-12-19 DIAGNOSIS — E1169 Type 2 diabetes mellitus with other specified complication: Secondary | ICD-10-CM

## 2019-12-20 DIAGNOSIS — I509 Heart failure, unspecified: Secondary | ICD-10-CM | POA: Diagnosis not present

## 2019-12-20 DIAGNOSIS — E1169 Type 2 diabetes mellitus with other specified complication: Secondary | ICD-10-CM | POA: Diagnosis not present

## 2019-12-20 DIAGNOSIS — J449 Chronic obstructive pulmonary disease, unspecified: Secondary | ICD-10-CM | POA: Diagnosis not present

## 2019-12-20 DIAGNOSIS — D649 Anemia, unspecified: Secondary | ICD-10-CM | POA: Diagnosis not present

## 2019-12-21 DIAGNOSIS — E1169 Type 2 diabetes mellitus with other specified complication: Secondary | ICD-10-CM | POA: Diagnosis not present

## 2019-12-21 DIAGNOSIS — J449 Chronic obstructive pulmonary disease, unspecified: Secondary | ICD-10-CM | POA: Diagnosis not present

## 2019-12-21 DIAGNOSIS — D649 Anemia, unspecified: Secondary | ICD-10-CM | POA: Diagnosis not present

## 2019-12-21 DIAGNOSIS — I509 Heart failure, unspecified: Secondary | ICD-10-CM | POA: Diagnosis not present

## 2019-12-22 DIAGNOSIS — J9601 Acute respiratory failure with hypoxia: Secondary | ICD-10-CM | POA: Diagnosis not present

## 2019-12-22 DIAGNOSIS — J441 Chronic obstructive pulmonary disease with (acute) exacerbation: Secondary | ICD-10-CM | POA: Diagnosis not present

## 2019-12-22 DIAGNOSIS — E1151 Type 2 diabetes mellitus with diabetic peripheral angiopathy without gangrene: Secondary | ICD-10-CM | POA: Diagnosis not present

## 2019-12-22 DIAGNOSIS — R0602 Shortness of breath: Secondary | ICD-10-CM | POA: Diagnosis not present

## 2019-12-22 DIAGNOSIS — I5023 Acute on chronic systolic (congestive) heart failure: Secondary | ICD-10-CM | POA: Diagnosis not present

## 2019-12-22 DIAGNOSIS — I5033 Acute on chronic diastolic (congestive) heart failure: Secondary | ICD-10-CM | POA: Diagnosis not present

## 2019-12-22 DIAGNOSIS — Z79899 Other long term (current) drug therapy: Secondary | ICD-10-CM | POA: Diagnosis not present

## 2019-12-22 DIAGNOSIS — G6181 Chronic inflammatory demyelinating polyneuritis: Secondary | ICD-10-CM | POA: Diagnosis not present

## 2019-12-22 DIAGNOSIS — I10 Essential (primary) hypertension: Secondary | ICD-10-CM | POA: Diagnosis not present

## 2019-12-22 DIAGNOSIS — R609 Edema, unspecified: Secondary | ICD-10-CM | POA: Diagnosis not present

## 2019-12-22 DIAGNOSIS — J189 Pneumonia, unspecified organism: Secondary | ICD-10-CM | POA: Diagnosis not present

## 2019-12-22 DIAGNOSIS — I110 Hypertensive heart disease with heart failure: Secondary | ICD-10-CM | POA: Diagnosis not present

## 2019-12-22 DIAGNOSIS — Z86711 Personal history of pulmonary embolism: Secondary | ICD-10-CM | POA: Diagnosis not present

## 2019-12-22 DIAGNOSIS — I509 Heart failure, unspecified: Secondary | ICD-10-CM | POA: Diagnosis not present

## 2019-12-22 DIAGNOSIS — E1142 Type 2 diabetes mellitus with diabetic polyneuropathy: Secondary | ICD-10-CM | POA: Diagnosis not present

## 2019-12-22 DIAGNOSIS — E1169 Type 2 diabetes mellitus with other specified complication: Secondary | ICD-10-CM | POA: Diagnosis not present

## 2019-12-22 DIAGNOSIS — M109 Gout, unspecified: Secondary | ICD-10-CM | POA: Diagnosis not present

## 2019-12-22 DIAGNOSIS — J449 Chronic obstructive pulmonary disease, unspecified: Secondary | ICD-10-CM | POA: Diagnosis not present

## 2019-12-22 DIAGNOSIS — Z950 Presence of cardiac pacemaker: Secondary | ICD-10-CM | POA: Diagnosis not present

## 2019-12-22 DIAGNOSIS — I4891 Unspecified atrial fibrillation: Secondary | ICD-10-CM | POA: Diagnosis not present

## 2019-12-22 DIAGNOSIS — G8929 Other chronic pain: Secondary | ICD-10-CM | POA: Diagnosis not present

## 2019-12-22 DIAGNOSIS — R262 Difficulty in walking, not elsewhere classified: Secondary | ICD-10-CM | POA: Diagnosis not present

## 2019-12-22 DIAGNOSIS — R293 Abnormal posture: Secondary | ICD-10-CM | POA: Diagnosis not present

## 2019-12-22 DIAGNOSIS — Z741 Need for assistance with personal care: Secondary | ICD-10-CM | POA: Diagnosis not present

## 2019-12-22 DIAGNOSIS — K5900 Constipation, unspecified: Secondary | ICD-10-CM | POA: Diagnosis not present

## 2019-12-22 DIAGNOSIS — J9602 Acute respiratory failure with hypercapnia: Secondary | ICD-10-CM | POA: Diagnosis not present

## 2019-12-22 DIAGNOSIS — D649 Anemia, unspecified: Secondary | ICD-10-CM | POA: Diagnosis not present

## 2019-12-22 DIAGNOSIS — J9622 Acute and chronic respiratory failure with hypercapnia: Secondary | ICD-10-CM | POA: Diagnosis not present

## 2019-12-22 DIAGNOSIS — Z888 Allergy status to other drugs, medicaments and biological substances status: Secondary | ICD-10-CM | POA: Diagnosis not present

## 2019-12-22 DIAGNOSIS — M6281 Muscle weakness (generalized): Secondary | ICD-10-CM | POA: Diagnosis not present

## 2019-12-22 DIAGNOSIS — Z7401 Bed confinement status: Secondary | ICD-10-CM | POA: Diagnosis not present

## 2019-12-22 DIAGNOSIS — I959 Hypotension, unspecified: Secondary | ICD-10-CM | POA: Diagnosis not present

## 2019-12-22 DIAGNOSIS — I2510 Atherosclerotic heart disease of native coronary artery without angina pectoris: Secondary | ICD-10-CM | POA: Diagnosis not present

## 2019-12-22 DIAGNOSIS — R278 Other lack of coordination: Secondary | ICD-10-CM | POA: Diagnosis not present

## 2019-12-22 DIAGNOSIS — Z89411 Acquired absence of right great toe: Secondary | ICD-10-CM | POA: Diagnosis not present

## 2019-12-22 DIAGNOSIS — E7800 Pure hypercholesterolemia, unspecified: Secondary | ICD-10-CM | POA: Diagnosis not present

## 2019-12-22 DIAGNOSIS — J9612 Chronic respiratory failure with hypercapnia: Secondary | ICD-10-CM | POA: Diagnosis not present

## 2019-12-22 DIAGNOSIS — Z66 Do not resuscitate: Secondary | ICD-10-CM | POA: Diagnosis not present

## 2019-12-22 DIAGNOSIS — J9600 Acute respiratory failure, unspecified whether with hypoxia or hypercapnia: Secondary | ICD-10-CM | POA: Diagnosis not present

## 2019-12-22 DIAGNOSIS — Z23 Encounter for immunization: Secondary | ICD-10-CM | POA: Diagnosis not present

## 2019-12-22 DIAGNOSIS — Z794 Long term (current) use of insulin: Secondary | ICD-10-CM | POA: Diagnosis not present

## 2019-12-22 DIAGNOSIS — J9621 Acute and chronic respiratory failure with hypoxia: Secondary | ICD-10-CM | POA: Diagnosis not present

## 2019-12-22 DIAGNOSIS — I5022 Chronic systolic (congestive) heart failure: Secondary | ICD-10-CM | POA: Diagnosis not present

## 2019-12-22 DIAGNOSIS — Z87891 Personal history of nicotine dependence: Secondary | ICD-10-CM | POA: Diagnosis not present

## 2019-12-22 DIAGNOSIS — Z7902 Long term (current) use of antithrombotics/antiplatelets: Secondary | ICD-10-CM | POA: Diagnosis not present

## 2019-12-22 DIAGNOSIS — Z9119 Patient's noncompliance with other medical treatment and regimen: Secondary | ICD-10-CM | POA: Diagnosis not present

## 2019-12-22 DIAGNOSIS — M2550 Pain in unspecified joint: Secondary | ICD-10-CM | POA: Diagnosis not present

## 2019-12-29 DIAGNOSIS — I4891 Unspecified atrial fibrillation: Secondary | ICD-10-CM | POA: Diagnosis not present

## 2019-12-29 DIAGNOSIS — R262 Difficulty in walking, not elsewhere classified: Secondary | ICD-10-CM | POA: Diagnosis not present

## 2019-12-29 DIAGNOSIS — J9612 Chronic respiratory failure with hypercapnia: Secondary | ICD-10-CM | POA: Diagnosis not present

## 2019-12-29 DIAGNOSIS — I5033 Acute on chronic diastolic (congestive) heart failure: Secondary | ICD-10-CM | POA: Diagnosis not present

## 2020-01-07 IMAGING — DX DG CHEST 2V
2 series · 2 of 2 positions shown · non-contrast
Comparison: 09/04/2018

CLINICAL DATA: ICD placement

EXAM:
CHEST - 2 VIEW

[chest ap]
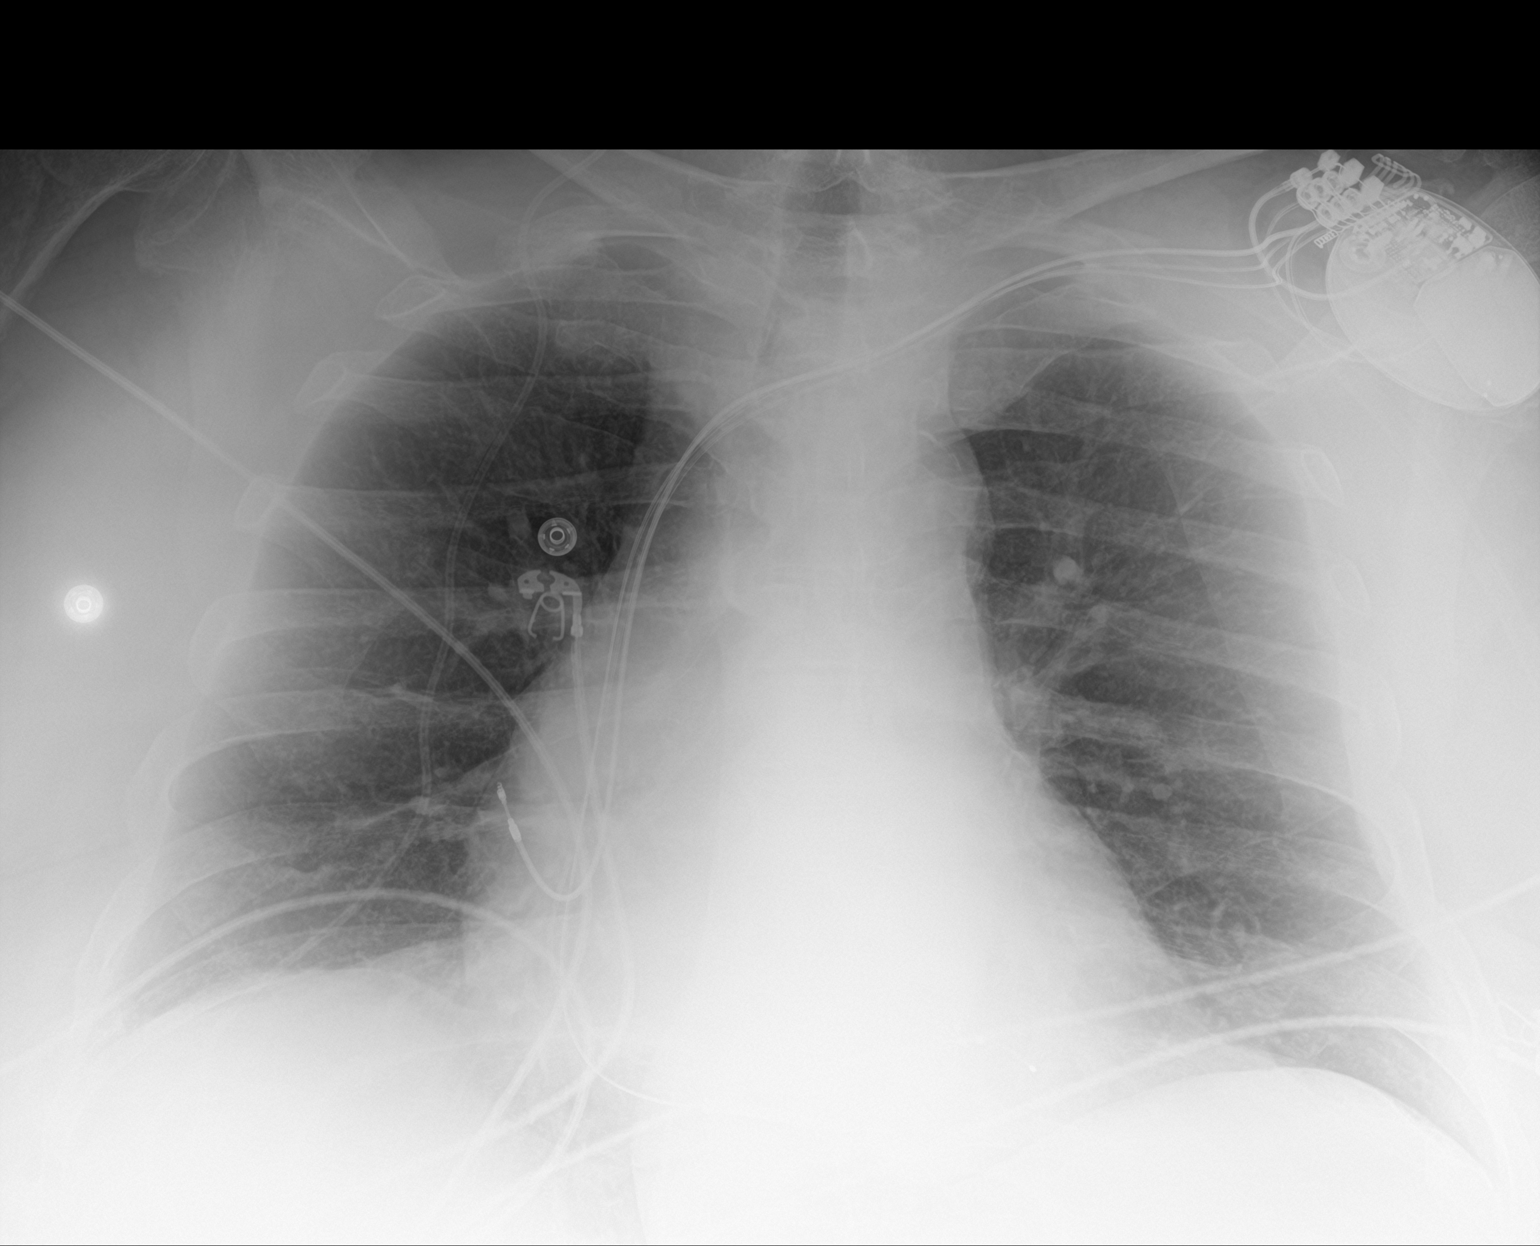

[chest lat]
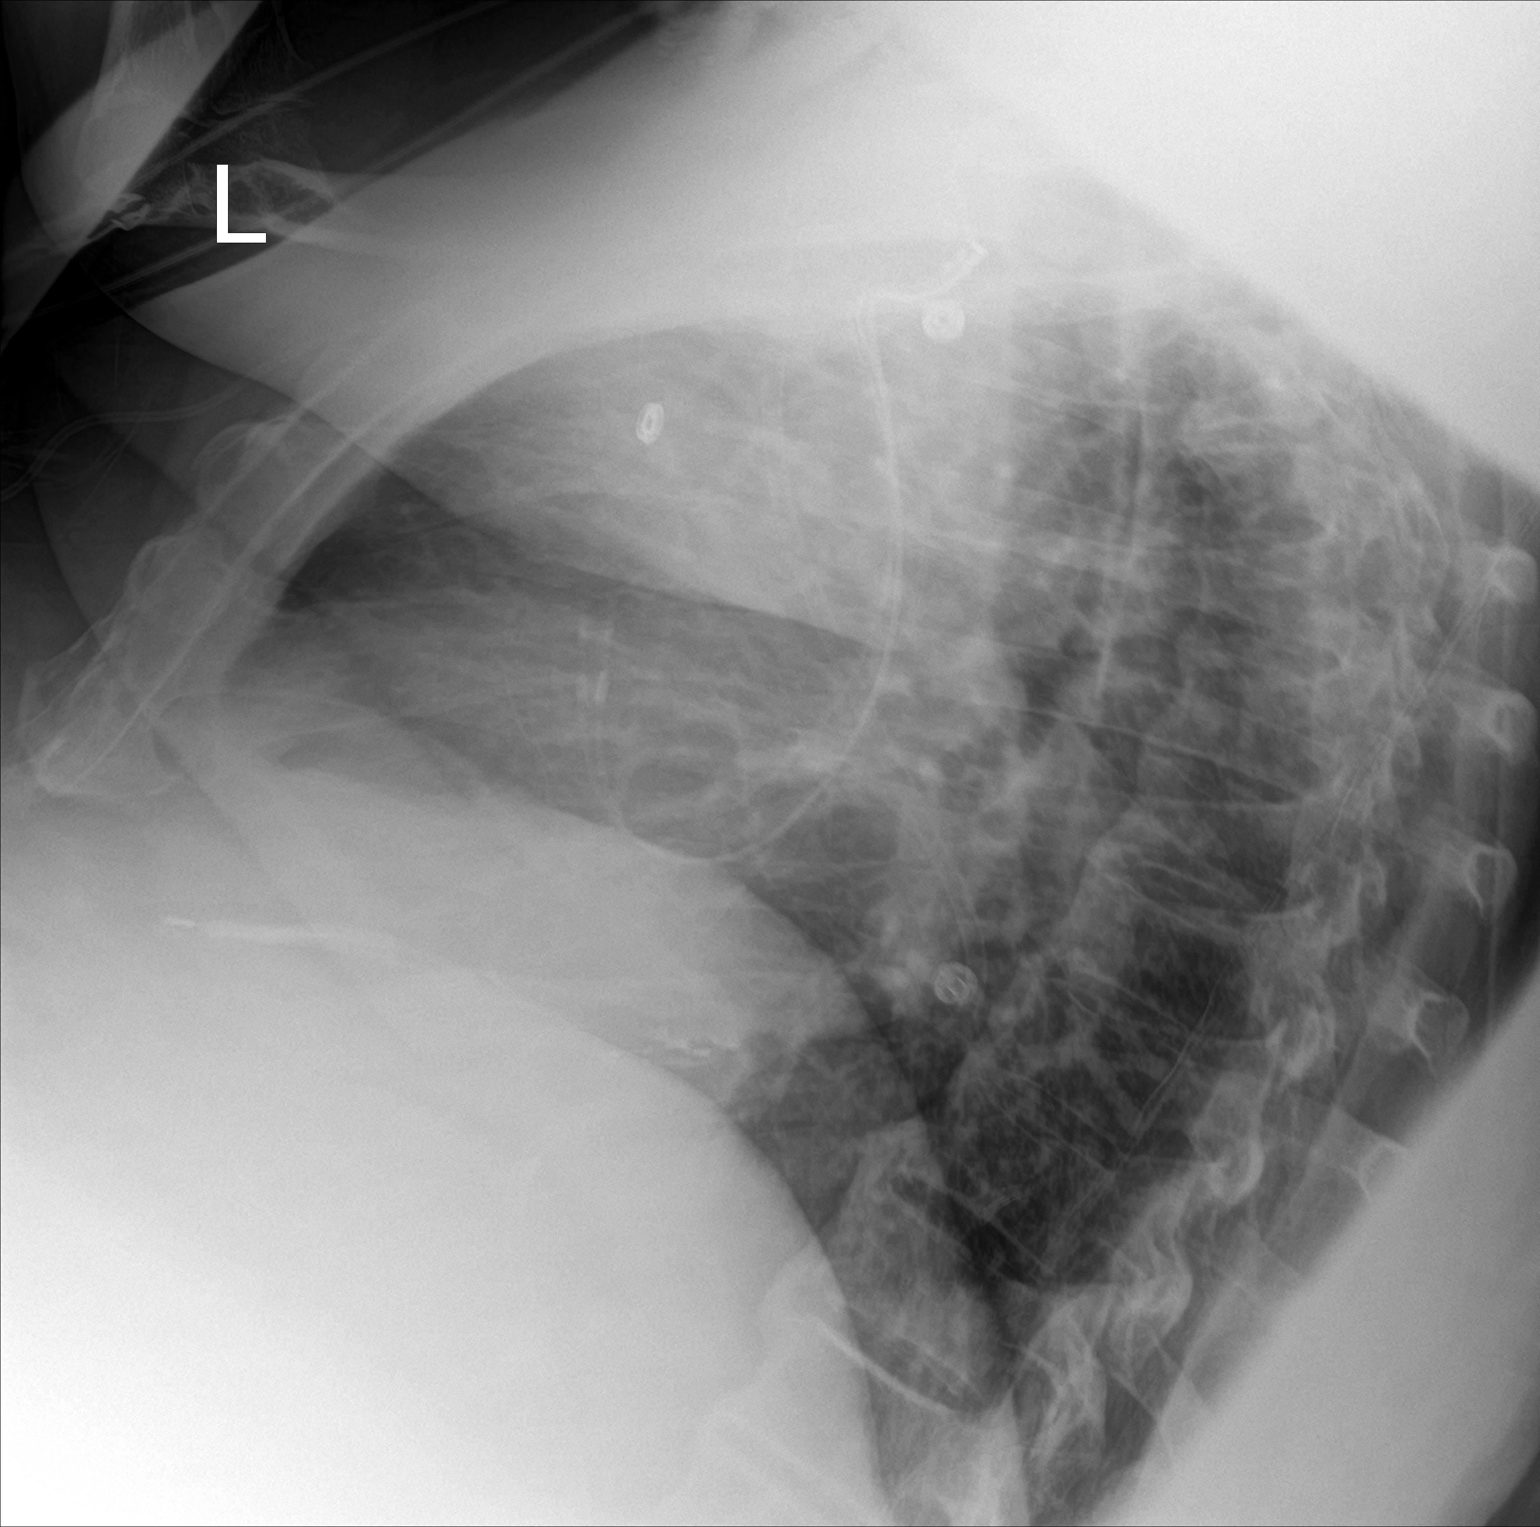

[2 of 2 positions shown; findings below may reference images not displayed]

FINDINGS: LEFT subclavian AICD with leads projecting at RIGHT atrium, RIGHT
ventricle and coronary sinus.

VP shunt tubing traverses RIGHT hemithorax.

Enlargement of cardiac silhouette.

Atherosclerotic calcification aorta.

Mediastinal contours and pulmonary vascularity otherwise normal.

Lungs clear without infiltrate, pleural effusion or pneumothorax.
IMPRESSION: LEFT subclavian AICD without acute abnormalities.

## 2020-01-12 ENCOUNTER — Other Ambulatory Visit: Payer: Self-pay

## 2020-01-12 ENCOUNTER — Encounter: Payer: Self-pay | Admitting: Cardiology

## 2020-01-12 NOTE — Patient Outreach (Signed)
[facility]) Care Management  01/12/2020  Johnathan Wright May 28, 1946 [id number]   Medication Adherence call to [name] HIPPA Compliant Voice message left with a call back number. [name] is showing past due on Atorvastatin 10 mg under [place].   [place] Management Direct Dial [phone]  Fax [phone] [email or web address]

## 2020-01-12 NOTE — Telephone Encounter (Signed)
error 

## 2020-01-26 ENCOUNTER — Inpatient Hospital Stay: Payer: Medicare Other | Admitting: Family Medicine

## 2020-02-16 DIAGNOSIS — D649 Anemia, unspecified: Secondary | ICD-10-CM | POA: Diagnosis not present

## 2020-02-16 DIAGNOSIS — E119 Type 2 diabetes mellitus without complications: Secondary | ICD-10-CM | POA: Diagnosis not present

## 2020-02-16 DIAGNOSIS — N189 Chronic kidney disease, unspecified: Secondary | ICD-10-CM | POA: Diagnosis not present

## 2020-02-16 DIAGNOSIS — E785 Hyperlipidemia, unspecified: Secondary | ICD-10-CM | POA: Diagnosis not present

## 2020-02-16 DIAGNOSIS — Z79899 Other long term (current) drug therapy: Secondary | ICD-10-CM | POA: Diagnosis not present

## 2020-02-18 DIAGNOSIS — E1151 Type 2 diabetes mellitus with diabetic peripheral angiopathy without gangrene: Secondary | ICD-10-CM | POA: Diagnosis not present

## 2020-02-18 DIAGNOSIS — I5032 Chronic diastolic (congestive) heart failure: Secondary | ICD-10-CM | POA: Diagnosis not present

## 2020-02-18 DIAGNOSIS — D649 Anemia, unspecified: Secondary | ICD-10-CM | POA: Diagnosis not present

## 2020-02-19 DIAGNOSIS — D649 Anemia, unspecified: Secondary | ICD-10-CM | POA: Diagnosis not present

## 2020-02-19 DIAGNOSIS — E039 Hypothyroidism, unspecified: Secondary | ICD-10-CM | POA: Diagnosis not present

## 2020-02-19 DIAGNOSIS — Z79899 Other long term (current) drug therapy: Secondary | ICD-10-CM | POA: Diagnosis not present

## 2020-02-19 DIAGNOSIS — D519 Vitamin B12 deficiency anemia, unspecified: Secondary | ICD-10-CM | POA: Diagnosis not present

## 2020-02-19 DIAGNOSIS — J9602 Acute respiratory failure with hypercapnia: Secondary | ICD-10-CM | POA: Diagnosis not present

## 2020-02-19 DIAGNOSIS — N189 Chronic kidney disease, unspecified: Secondary | ICD-10-CM | POA: Diagnosis not present

## 2020-02-19 DIAGNOSIS — R0602 Shortness of breath: Secondary | ICD-10-CM | POA: Diagnosis not present

## 2020-02-19 DIAGNOSIS — I5023 Acute on chronic systolic (congestive) heart failure: Secondary | ICD-10-CM | POA: Diagnosis not present

## 2020-03-03 ENCOUNTER — Telehealth: Payer: Self-pay

## 2020-03-03 ENCOUNTER — Ambulatory Visit: Payer: Medicare Other

## 2020-03-03 NOTE — Telephone Encounter (Signed)
Left message for patient to remind of missed remote transmission.  

## 2020-03-09 LAB — CUP PACEART REMOTE DEVICE CHECK
Battery Remaining Longevity: 90 mo
Battery Remaining Percentage: 100 %
Brady Statistic RA Percent Paced: 86 %
Brady Statistic RV Percent Paced: 100 %
Date Time Interrogation Session: 20210512001100
HighPow Impedance: 80 Ohm
Implantable Lead Implant Date: 20191118
Implantable Lead Implant Date: 20191118
Implantable Lead Implant Date: 20191118
Implantable Lead Location: 753858
Implantable Lead Location: 753859
Implantable Lead Location: 753860
Implantable Lead Model: 293
Implantable Lead Model: 4674
Implantable Lead Model: 7741
Implantable Lead Serial Number: 1025951
Implantable Lead Serial Number: 442535
Implantable Lead Serial Number: 833002
Implantable Pulse Generator Implant Date: 20191118
Lead Channel Impedance Value: 427 Ohm
Lead Channel Impedance Value: 592 Ohm
Lead Channel Impedance Value: 752 Ohm
Lead Channel Setting Pacing Amplitude: 3.5 V
Lead Channel Setting Pacing Amplitude: 3.5 V
Lead Channel Setting Pacing Amplitude: 3.5 V
Lead Channel Setting Pacing Pulse Width: 0.4 ms
Lead Channel Setting Pacing Pulse Width: 0.4 ms
Lead Channel Setting Sensing Sensitivity: 0.5 mV
Lead Channel Setting Sensing Sensitivity: 1 mV
Pulse Gen Serial Number: 219271

## 2020-03-17 NOTE — Telephone Encounter (Signed)
LMOM that remote transmission was received and no issues or concerns , device function WNL.

## 2020-03-17 NOTE — Telephone Encounter (Signed)
New message   Per Johnathan Wright wants to know if the transmission she sent was received. Please call.

## 2020-03-20 DIAGNOSIS — N1831 Chronic kidney disease, stage 3a: Secondary | ICD-10-CM | POA: Diagnosis not present

## 2020-03-20 DIAGNOSIS — I5032 Chronic diastolic (congestive) heart failure: Secondary | ICD-10-CM | POA: Diagnosis not present

## 2020-03-20 DIAGNOSIS — R5381 Other malaise: Secondary | ICD-10-CM | POA: Diagnosis not present

## 2020-03-20 DIAGNOSIS — I4891 Unspecified atrial fibrillation: Secondary | ICD-10-CM | POA: Diagnosis not present

## 2020-04-12 DIAGNOSIS — R5381 Other malaise: Secondary | ICD-10-CM | POA: Diagnosis not present

## 2020-04-12 DIAGNOSIS — J9612 Chronic respiratory failure with hypercapnia: Secondary | ICD-10-CM | POA: Diagnosis not present

## 2020-04-12 DIAGNOSIS — Z9981 Dependence on supplemental oxygen: Secondary | ICD-10-CM | POA: Diagnosis not present

## 2020-04-12 DIAGNOSIS — Z7401 Bed confinement status: Secondary | ICD-10-CM | POA: Diagnosis not present

## 2020-04-12 DIAGNOSIS — Z9989 Dependence on other enabling machines and devices: Secondary | ICD-10-CM | POA: Diagnosis not present

## 2020-04-12 DIAGNOSIS — I878 Other specified disorders of veins: Secondary | ICD-10-CM | POA: Diagnosis not present

## 2020-04-12 DIAGNOSIS — I110 Hypertensive heart disease with heart failure: Secondary | ICD-10-CM | POA: Diagnosis not present

## 2020-04-12 DIAGNOSIS — J9602 Acute respiratory failure with hypercapnia: Secondary | ICD-10-CM | POA: Diagnosis not present

## 2020-04-12 DIAGNOSIS — Z794 Long term (current) use of insulin: Secondary | ICD-10-CM | POA: Diagnosis not present

## 2020-04-12 DIAGNOSIS — R69 Illness, unspecified: Secondary | ICD-10-CM | POA: Diagnosis not present

## 2020-04-12 DIAGNOSIS — J9601 Acute respiratory failure with hypoxia: Secondary | ICD-10-CM | POA: Diagnosis not present

## 2020-04-12 DIAGNOSIS — I4891 Unspecified atrial fibrillation: Secondary | ICD-10-CM | POA: Diagnosis not present

## 2020-04-12 DIAGNOSIS — J984 Other disorders of lung: Secondary | ICD-10-CM | POA: Diagnosis not present

## 2020-04-12 DIAGNOSIS — Z2821 Immunization not carried out because of patient refusal: Secondary | ICD-10-CM | POA: Diagnosis not present

## 2020-04-12 DIAGNOSIS — G650 Sequelae of Guillain-Barre syndrome: Secondary | ICD-10-CM | POA: Diagnosis not present

## 2020-04-12 DIAGNOSIS — G4733 Obstructive sleep apnea (adult) (pediatric): Secondary | ICD-10-CM | POA: Diagnosis not present

## 2020-04-12 DIAGNOSIS — J449 Chronic obstructive pulmonary disease, unspecified: Secondary | ICD-10-CM | POA: Diagnosis not present

## 2020-04-12 DIAGNOSIS — Z7901 Long term (current) use of anticoagulants: Secondary | ICD-10-CM | POA: Diagnosis not present

## 2020-04-12 DIAGNOSIS — E119 Type 2 diabetes mellitus without complications: Secondary | ICD-10-CM | POA: Diagnosis not present

## 2020-04-12 DIAGNOSIS — E1165 Type 2 diabetes mellitus with hyperglycemia: Secondary | ICD-10-CM | POA: Diagnosis not present

## 2020-04-12 DIAGNOSIS — I5042 Chronic combined systolic (congestive) and diastolic (congestive) heart failure: Secondary | ICD-10-CM | POA: Diagnosis not present

## 2020-04-12 DIAGNOSIS — J441 Chronic obstructive pulmonary disease with (acute) exacerbation: Secondary | ICD-10-CM | POA: Diagnosis not present

## 2020-04-12 DIAGNOSIS — R61 Generalized hyperhidrosis: Secondary | ICD-10-CM | POA: Diagnosis not present

## 2020-04-12 DIAGNOSIS — I2510 Atherosclerotic heart disease of native coronary artery without angina pectoris: Secondary | ICD-10-CM | POA: Diagnosis not present

## 2020-04-12 DIAGNOSIS — Z950 Presence of cardiac pacemaker: Secondary | ICD-10-CM | POA: Diagnosis not present

## 2020-04-12 DIAGNOSIS — Z79899 Other long term (current) drug therapy: Secondary | ICD-10-CM | POA: Diagnosis not present

## 2020-04-12 DIAGNOSIS — R0902 Hypoxemia: Secondary | ICD-10-CM | POA: Diagnosis not present

## 2020-04-12 DIAGNOSIS — Z743 Need for continuous supervision: Secondary | ICD-10-CM | POA: Diagnosis not present

## 2020-04-12 DIAGNOSIS — E785 Hyperlipidemia, unspecified: Secondary | ICD-10-CM | POA: Diagnosis not present

## 2020-04-15 DIAGNOSIS — J441 Chronic obstructive pulmonary disease with (acute) exacerbation: Secondary | ICD-10-CM | POA: Diagnosis not present

## 2020-04-15 DIAGNOSIS — E1151 Type 2 diabetes mellitus with diabetic peripheral angiopathy without gangrene: Secondary | ICD-10-CM | POA: Diagnosis not present

## 2020-04-15 DIAGNOSIS — J9621 Acute and chronic respiratory failure with hypoxia: Secondary | ICD-10-CM | POA: Diagnosis not present

## 2020-04-15 DIAGNOSIS — I5032 Chronic diastolic (congestive) heart failure: Secondary | ICD-10-CM | POA: Diagnosis not present

## 2020-05-20 DIAGNOSIS — G4731 Primary central sleep apnea: Secondary | ICD-10-CM | POA: Diagnosis not present

## 2020-05-20 DIAGNOSIS — J9611 Chronic respiratory failure with hypoxia: Secondary | ICD-10-CM | POA: Diagnosis not present

## 2020-05-20 DIAGNOSIS — R5383 Other fatigue: Secondary | ICD-10-CM | POA: Diagnosis not present

## 2020-05-20 DIAGNOSIS — I509 Heart failure, unspecified: Secondary | ICD-10-CM | POA: Diagnosis not present

## 2020-05-22 DIAGNOSIS — J9611 Chronic respiratory failure with hypoxia: Secondary | ICD-10-CM | POA: Diagnosis not present

## 2020-05-22 DIAGNOSIS — R5381 Other malaise: Secondary | ICD-10-CM | POA: Diagnosis not present

## 2020-05-22 DIAGNOSIS — I2510 Atherosclerotic heart disease of native coronary artery without angina pectoris: Secondary | ICD-10-CM | POA: Diagnosis not present

## 2020-05-22 DIAGNOSIS — N1831 Chronic kidney disease, stage 3a: Secondary | ICD-10-CM | POA: Diagnosis not present

## 2020-05-22 DIAGNOSIS — I5032 Chronic diastolic (congestive) heart failure: Secondary | ICD-10-CM | POA: Diagnosis not present

## 2020-05-22 DIAGNOSIS — E1151 Type 2 diabetes mellitus with diabetic peripheral angiopathy without gangrene: Secondary | ICD-10-CM | POA: Diagnosis not present

## 2020-06-02 ENCOUNTER — Ambulatory Visit (INDEPENDENT_AMBULATORY_CARE_PROVIDER_SITE_OTHER): Payer: Medicare Other | Admitting: *Deleted

## 2020-06-02 DIAGNOSIS — I480 Paroxysmal atrial fibrillation: Secondary | ICD-10-CM

## 2020-06-09 LAB — CUP PACEART REMOTE DEVICE CHECK
Battery Remaining Longevity: 84 mo
Battery Remaining Percentage: 96 %
Brady Statistic RA Percent Paced: 87 %
Brady Statistic RV Percent Paced: 100 %
Date Time Interrogation Session: 20210811001200
HighPow Impedance: 83 Ohm
Implantable Lead Implant Date: 20191118
Implantable Lead Implant Date: 20191118
Implantable Lead Implant Date: 20191118
Implantable Lead Location: 753858
Implantable Lead Location: 753859
Implantable Lead Location: 753860
Implantable Lead Model: 293
Implantable Lead Model: 4674
Implantable Lead Model: 7741
Implantable Lead Serial Number: 1025951
Implantable Lead Serial Number: 442535
Implantable Lead Serial Number: 833002
Implantable Pulse Generator Implant Date: 20191118
Lead Channel Impedance Value: 460 Ohm
Lead Channel Impedance Value: 591 Ohm
Lead Channel Impedance Value: 751 Ohm
Lead Channel Setting Pacing Amplitude: 3.5 V
Lead Channel Setting Pacing Amplitude: 3.5 V
Lead Channel Setting Pacing Amplitude: 3.5 V
Lead Channel Setting Pacing Pulse Width: 0.4 ms
Lead Channel Setting Pacing Pulse Width: 0.4 ms
Lead Channel Setting Sensing Sensitivity: 0.5 mV
Lead Channel Setting Sensing Sensitivity: 1 mV
Pulse Gen Serial Number: 219271

## 2020-06-11 NOTE — Progress Notes (Signed)
Remote ICD transmission.   

## 2020-06-19 DIAGNOSIS — I2510 Atherosclerotic heart disease of native coronary artery without angina pectoris: Secondary | ICD-10-CM | POA: Diagnosis not present

## 2020-06-19 DIAGNOSIS — J9611 Chronic respiratory failure with hypoxia: Secondary | ICD-10-CM | POA: Diagnosis not present

## 2020-07-08 DIAGNOSIS — N189 Chronic kidney disease, unspecified: Secondary | ICD-10-CM | POA: Diagnosis not present

## 2020-07-08 DIAGNOSIS — I5023 Acute on chronic systolic (congestive) heart failure: Secondary | ICD-10-CM | POA: Diagnosis not present

## 2020-07-08 DIAGNOSIS — Z79899 Other long term (current) drug therapy: Secondary | ICD-10-CM | POA: Diagnosis not present

## 2020-07-21 DIAGNOSIS — I4891 Unspecified atrial fibrillation: Secondary | ICD-10-CM | POA: Diagnosis not present

## 2020-07-21 DIAGNOSIS — D649 Anemia, unspecified: Secondary | ICD-10-CM | POA: Diagnosis not present

## 2020-07-21 DIAGNOSIS — E1142 Type 2 diabetes mellitus with diabetic polyneuropathy: Secondary | ICD-10-CM | POA: Diagnosis not present

## 2020-07-21 DIAGNOSIS — I2510 Atherosclerotic heart disease of native coronary artery without angina pectoris: Secondary | ICD-10-CM | POA: Diagnosis not present

## 2020-07-21 DIAGNOSIS — E1151 Type 2 diabetes mellitus with diabetic peripheral angiopathy without gangrene: Secondary | ICD-10-CM | POA: Diagnosis not present

## 2020-07-21 DIAGNOSIS — J9611 Chronic respiratory failure with hypoxia: Secondary | ICD-10-CM | POA: Diagnosis not present

## 2020-07-21 DIAGNOSIS — I5032 Chronic diastolic (congestive) heart failure: Secondary | ICD-10-CM | POA: Diagnosis not present

## 2020-07-26 DIAGNOSIS — J441 Chronic obstructive pulmonary disease with (acute) exacerbation: Secondary | ICD-10-CM | POA: Diagnosis not present

## 2020-07-26 DIAGNOSIS — R262 Difficulty in walking, not elsewhere classified: Secondary | ICD-10-CM | POA: Diagnosis not present

## 2020-07-26 DIAGNOSIS — I509 Heart failure, unspecified: Secondary | ICD-10-CM | POA: Diagnosis not present

## 2020-07-26 DIAGNOSIS — R278 Other lack of coordination: Secondary | ICD-10-CM | POA: Diagnosis not present

## 2020-07-26 DIAGNOSIS — Z741 Need for assistance with personal care: Secondary | ICD-10-CM | POA: Diagnosis not present

## 2020-08-02 DIAGNOSIS — J441 Chronic obstructive pulmonary disease with (acute) exacerbation: Secondary | ICD-10-CM | POA: Diagnosis not present

## 2020-08-02 DIAGNOSIS — Z741 Need for assistance with personal care: Secondary | ICD-10-CM | POA: Diagnosis not present

## 2020-08-02 DIAGNOSIS — R262 Difficulty in walking, not elsewhere classified: Secondary | ICD-10-CM | POA: Diagnosis not present

## 2020-08-02 DIAGNOSIS — R278 Other lack of coordination: Secondary | ICD-10-CM | POA: Diagnosis not present

## 2020-08-03 DIAGNOSIS — R278 Other lack of coordination: Secondary | ICD-10-CM | POA: Diagnosis not present

## 2020-08-03 DIAGNOSIS — J441 Chronic obstructive pulmonary disease with (acute) exacerbation: Secondary | ICD-10-CM | POA: Diagnosis not present

## 2020-08-03 DIAGNOSIS — R262 Difficulty in walking, not elsewhere classified: Secondary | ICD-10-CM | POA: Diagnosis not present

## 2020-08-03 DIAGNOSIS — Z741 Need for assistance with personal care: Secondary | ICD-10-CM | POA: Diagnosis not present

## 2020-08-04 DIAGNOSIS — R278 Other lack of coordination: Secondary | ICD-10-CM | POA: Diagnosis not present

## 2020-08-04 DIAGNOSIS — Z741 Need for assistance with personal care: Secondary | ICD-10-CM | POA: Diagnosis not present

## 2020-08-04 DIAGNOSIS — R262 Difficulty in walking, not elsewhere classified: Secondary | ICD-10-CM | POA: Diagnosis not present

## 2020-08-04 DIAGNOSIS — J441 Chronic obstructive pulmonary disease with (acute) exacerbation: Secondary | ICD-10-CM | POA: Diagnosis not present

## 2020-08-05 DIAGNOSIS — R278 Other lack of coordination: Secondary | ICD-10-CM | POA: Diagnosis not present

## 2020-08-05 DIAGNOSIS — R262 Difficulty in walking, not elsewhere classified: Secondary | ICD-10-CM | POA: Diagnosis not present

## 2020-08-05 DIAGNOSIS — Z741 Need for assistance with personal care: Secondary | ICD-10-CM | POA: Diagnosis not present

## 2020-08-05 DIAGNOSIS — J441 Chronic obstructive pulmonary disease with (acute) exacerbation: Secondary | ICD-10-CM | POA: Diagnosis not present

## 2020-08-06 DIAGNOSIS — R278 Other lack of coordination: Secondary | ICD-10-CM | POA: Diagnosis not present

## 2020-08-06 DIAGNOSIS — J441 Chronic obstructive pulmonary disease with (acute) exacerbation: Secondary | ICD-10-CM | POA: Diagnosis not present

## 2020-08-06 DIAGNOSIS — R262 Difficulty in walking, not elsewhere classified: Secondary | ICD-10-CM | POA: Diagnosis not present

## 2020-08-06 DIAGNOSIS — Z741 Need for assistance with personal care: Secondary | ICD-10-CM | POA: Diagnosis not present

## 2020-08-09 DIAGNOSIS — J441 Chronic obstructive pulmonary disease with (acute) exacerbation: Secondary | ICD-10-CM | POA: Diagnosis not present

## 2020-08-09 DIAGNOSIS — Z741 Need for assistance with personal care: Secondary | ICD-10-CM | POA: Diagnosis not present

## 2020-08-09 DIAGNOSIS — R278 Other lack of coordination: Secondary | ICD-10-CM | POA: Diagnosis not present

## 2020-08-09 DIAGNOSIS — R262 Difficulty in walking, not elsewhere classified: Secondary | ICD-10-CM | POA: Diagnosis not present

## 2020-08-11 DIAGNOSIS — M79675 Pain in left toe(s): Secondary | ICD-10-CM | POA: Diagnosis not present

## 2020-08-11 DIAGNOSIS — R278 Other lack of coordination: Secondary | ICD-10-CM | POA: Diagnosis not present

## 2020-08-11 DIAGNOSIS — M79674 Pain in right toe(s): Secondary | ICD-10-CM | POA: Diagnosis not present

## 2020-08-11 DIAGNOSIS — B351 Tinea unguium: Secondary | ICD-10-CM | POA: Diagnosis not present

## 2020-08-11 DIAGNOSIS — J441 Chronic obstructive pulmonary disease with (acute) exacerbation: Secondary | ICD-10-CM | POA: Diagnosis not present

## 2020-08-11 DIAGNOSIS — Z741 Need for assistance with personal care: Secondary | ICD-10-CM | POA: Diagnosis not present

## 2020-08-11 DIAGNOSIS — R262 Difficulty in walking, not elsewhere classified: Secondary | ICD-10-CM | POA: Diagnosis not present

## 2020-08-12 DIAGNOSIS — J441 Chronic obstructive pulmonary disease with (acute) exacerbation: Secondary | ICD-10-CM | POA: Diagnosis not present

## 2020-08-12 DIAGNOSIS — R278 Other lack of coordination: Secondary | ICD-10-CM | POA: Diagnosis not present

## 2020-08-12 DIAGNOSIS — R262 Difficulty in walking, not elsewhere classified: Secondary | ICD-10-CM | POA: Diagnosis not present

## 2020-08-12 DIAGNOSIS — Z741 Need for assistance with personal care: Secondary | ICD-10-CM | POA: Diagnosis not present

## 2020-08-13 DIAGNOSIS — Z741 Need for assistance with personal care: Secondary | ICD-10-CM | POA: Diagnosis not present

## 2020-08-13 DIAGNOSIS — R262 Difficulty in walking, not elsewhere classified: Secondary | ICD-10-CM | POA: Diagnosis not present

## 2020-08-13 DIAGNOSIS — J441 Chronic obstructive pulmonary disease with (acute) exacerbation: Secondary | ICD-10-CM | POA: Diagnosis not present

## 2020-08-13 DIAGNOSIS — R278 Other lack of coordination: Secondary | ICD-10-CM | POA: Diagnosis not present

## 2020-08-16 DIAGNOSIS — Z741 Need for assistance with personal care: Secondary | ICD-10-CM | POA: Diagnosis not present

## 2020-08-16 DIAGNOSIS — J441 Chronic obstructive pulmonary disease with (acute) exacerbation: Secondary | ICD-10-CM | POA: Diagnosis not present

## 2020-08-16 DIAGNOSIS — R262 Difficulty in walking, not elsewhere classified: Secondary | ICD-10-CM | POA: Diagnosis not present

## 2020-08-16 DIAGNOSIS — R278 Other lack of coordination: Secondary | ICD-10-CM | POA: Diagnosis not present

## 2020-08-17 DIAGNOSIS — R278 Other lack of coordination: Secondary | ICD-10-CM | POA: Diagnosis not present

## 2020-08-17 DIAGNOSIS — R262 Difficulty in walking, not elsewhere classified: Secondary | ICD-10-CM | POA: Diagnosis not present

## 2020-08-17 DIAGNOSIS — J441 Chronic obstructive pulmonary disease with (acute) exacerbation: Secondary | ICD-10-CM | POA: Diagnosis not present

## 2020-08-17 DIAGNOSIS — Z741 Need for assistance with personal care: Secondary | ICD-10-CM | POA: Diagnosis not present

## 2020-08-18 DIAGNOSIS — J441 Chronic obstructive pulmonary disease with (acute) exacerbation: Secondary | ICD-10-CM | POA: Diagnosis not present

## 2020-08-18 DIAGNOSIS — R278 Other lack of coordination: Secondary | ICD-10-CM | POA: Diagnosis not present

## 2020-08-18 DIAGNOSIS — Z741 Need for assistance with personal care: Secondary | ICD-10-CM | POA: Diagnosis not present

## 2020-08-18 DIAGNOSIS — R262 Difficulty in walking, not elsewhere classified: Secondary | ICD-10-CM | POA: Diagnosis not present

## 2020-08-19 DIAGNOSIS — Z741 Need for assistance with personal care: Secondary | ICD-10-CM | POA: Diagnosis not present

## 2020-08-19 DIAGNOSIS — R262 Difficulty in walking, not elsewhere classified: Secondary | ICD-10-CM | POA: Diagnosis not present

## 2020-08-19 DIAGNOSIS — R278 Other lack of coordination: Secondary | ICD-10-CM | POA: Diagnosis not present

## 2020-08-19 DIAGNOSIS — J441 Chronic obstructive pulmonary disease with (acute) exacerbation: Secondary | ICD-10-CM | POA: Diagnosis not present

## 2020-08-20 DIAGNOSIS — R278 Other lack of coordination: Secondary | ICD-10-CM | POA: Diagnosis not present

## 2020-08-20 DIAGNOSIS — I5032 Chronic diastolic (congestive) heart failure: Secondary | ICD-10-CM | POA: Diagnosis not present

## 2020-08-20 DIAGNOSIS — Z741 Need for assistance with personal care: Secondary | ICD-10-CM | POA: Diagnosis not present

## 2020-08-20 DIAGNOSIS — I4891 Unspecified atrial fibrillation: Secondary | ICD-10-CM | POA: Diagnosis not present

## 2020-08-20 DIAGNOSIS — R5381 Other malaise: Secondary | ICD-10-CM | POA: Diagnosis not present

## 2020-08-20 DIAGNOSIS — R262 Difficulty in walking, not elsewhere classified: Secondary | ICD-10-CM | POA: Diagnosis not present

## 2020-08-20 DIAGNOSIS — J441 Chronic obstructive pulmonary disease with (acute) exacerbation: Secondary | ICD-10-CM | POA: Diagnosis not present

## 2020-08-23 DIAGNOSIS — R278 Other lack of coordination: Secondary | ICD-10-CM | POA: Diagnosis not present

## 2020-08-23 DIAGNOSIS — R262 Difficulty in walking, not elsewhere classified: Secondary | ICD-10-CM | POA: Diagnosis not present

## 2020-08-23 DIAGNOSIS — J441 Chronic obstructive pulmonary disease with (acute) exacerbation: Secondary | ICD-10-CM | POA: Diagnosis not present

## 2020-08-23 DIAGNOSIS — M9907 Segmental and somatic dysfunction of upper extremity: Secondary | ICD-10-CM | POA: Diagnosis not present

## 2020-08-23 DIAGNOSIS — Z741 Need for assistance with personal care: Secondary | ICD-10-CM | POA: Diagnosis not present

## 2020-08-24 DIAGNOSIS — M9907 Segmental and somatic dysfunction of upper extremity: Secondary | ICD-10-CM | POA: Diagnosis not present

## 2020-08-24 DIAGNOSIS — R278 Other lack of coordination: Secondary | ICD-10-CM | POA: Diagnosis not present

## 2020-08-24 DIAGNOSIS — R262 Difficulty in walking, not elsewhere classified: Secondary | ICD-10-CM | POA: Diagnosis not present

## 2020-08-24 DIAGNOSIS — Z741 Need for assistance with personal care: Secondary | ICD-10-CM | POA: Diagnosis not present

## 2020-08-24 DIAGNOSIS — J441 Chronic obstructive pulmonary disease with (acute) exacerbation: Secondary | ICD-10-CM | POA: Diagnosis not present

## 2020-08-25 DIAGNOSIS — J441 Chronic obstructive pulmonary disease with (acute) exacerbation: Secondary | ICD-10-CM | POA: Diagnosis not present

## 2020-08-25 DIAGNOSIS — M9907 Segmental and somatic dysfunction of upper extremity: Secondary | ICD-10-CM | POA: Diagnosis not present

## 2020-08-25 DIAGNOSIS — Z741 Need for assistance with personal care: Secondary | ICD-10-CM | POA: Diagnosis not present

## 2020-08-25 DIAGNOSIS — R278 Other lack of coordination: Secondary | ICD-10-CM | POA: Diagnosis not present

## 2020-08-25 DIAGNOSIS — R262 Difficulty in walking, not elsewhere classified: Secondary | ICD-10-CM | POA: Diagnosis not present

## 2020-08-26 DIAGNOSIS — J441 Chronic obstructive pulmonary disease with (acute) exacerbation: Secondary | ICD-10-CM | POA: Diagnosis not present

## 2020-08-26 DIAGNOSIS — R278 Other lack of coordination: Secondary | ICD-10-CM | POA: Diagnosis not present

## 2020-08-26 DIAGNOSIS — I509 Heart failure, unspecified: Secondary | ICD-10-CM | POA: Diagnosis not present

## 2020-08-26 DIAGNOSIS — Z741 Need for assistance with personal care: Secondary | ICD-10-CM | POA: Diagnosis not present

## 2020-08-26 DIAGNOSIS — M9907 Segmental and somatic dysfunction of upper extremity: Secondary | ICD-10-CM | POA: Diagnosis not present

## 2020-08-26 DIAGNOSIS — R262 Difficulty in walking, not elsewhere classified: Secondary | ICD-10-CM | POA: Diagnosis not present

## 2020-08-27 DIAGNOSIS — R262 Difficulty in walking, not elsewhere classified: Secondary | ICD-10-CM | POA: Diagnosis not present

## 2020-08-27 DIAGNOSIS — J441 Chronic obstructive pulmonary disease with (acute) exacerbation: Secondary | ICD-10-CM | POA: Diagnosis not present

## 2020-08-27 DIAGNOSIS — R278 Other lack of coordination: Secondary | ICD-10-CM | POA: Diagnosis not present

## 2020-08-27 DIAGNOSIS — Z741 Need for assistance with personal care: Secondary | ICD-10-CM | POA: Diagnosis not present

## 2020-08-27 DIAGNOSIS — M9907 Segmental and somatic dysfunction of upper extremity: Secondary | ICD-10-CM | POA: Diagnosis not present

## 2020-08-30 DIAGNOSIS — J441 Chronic obstructive pulmonary disease with (acute) exacerbation: Secondary | ICD-10-CM | POA: Diagnosis not present

## 2020-08-30 DIAGNOSIS — M9907 Segmental and somatic dysfunction of upper extremity: Secondary | ICD-10-CM | POA: Diagnosis not present

## 2020-08-30 DIAGNOSIS — R278 Other lack of coordination: Secondary | ICD-10-CM | POA: Diagnosis not present

## 2020-08-30 DIAGNOSIS — R262 Difficulty in walking, not elsewhere classified: Secondary | ICD-10-CM | POA: Diagnosis not present

## 2020-08-30 DIAGNOSIS — Z741 Need for assistance with personal care: Secondary | ICD-10-CM | POA: Diagnosis not present

## 2020-08-31 DIAGNOSIS — Z741 Need for assistance with personal care: Secondary | ICD-10-CM | POA: Diagnosis not present

## 2020-08-31 DIAGNOSIS — R278 Other lack of coordination: Secondary | ICD-10-CM | POA: Diagnosis not present

## 2020-08-31 DIAGNOSIS — R262 Difficulty in walking, not elsewhere classified: Secondary | ICD-10-CM | POA: Diagnosis not present

## 2020-08-31 DIAGNOSIS — J441 Chronic obstructive pulmonary disease with (acute) exacerbation: Secondary | ICD-10-CM | POA: Diagnosis not present

## 2020-08-31 DIAGNOSIS — M9907 Segmental and somatic dysfunction of upper extremity: Secondary | ICD-10-CM | POA: Diagnosis not present

## 2020-09-01 ENCOUNTER — Ambulatory Visit (INDEPENDENT_AMBULATORY_CARE_PROVIDER_SITE_OTHER): Payer: Medicare Other

## 2020-09-01 DIAGNOSIS — I447 Left bundle-branch block, unspecified: Secondary | ICD-10-CM

## 2020-09-01 DIAGNOSIS — I5022 Chronic systolic (congestive) heart failure: Secondary | ICD-10-CM | POA: Diagnosis not present

## 2020-09-02 DIAGNOSIS — Z741 Need for assistance with personal care: Secondary | ICD-10-CM | POA: Diagnosis not present

## 2020-09-02 DIAGNOSIS — R278 Other lack of coordination: Secondary | ICD-10-CM | POA: Diagnosis not present

## 2020-09-02 DIAGNOSIS — R262 Difficulty in walking, not elsewhere classified: Secondary | ICD-10-CM | POA: Diagnosis not present

## 2020-09-02 DIAGNOSIS — J441 Chronic obstructive pulmonary disease with (acute) exacerbation: Secondary | ICD-10-CM | POA: Diagnosis not present

## 2020-09-02 DIAGNOSIS — M9907 Segmental and somatic dysfunction of upper extremity: Secondary | ICD-10-CM | POA: Diagnosis not present

## 2020-09-02 LAB — CUP PACEART REMOTE DEVICE CHECK
Date Time Interrogation Session: 20211110071934
Implantable Lead Implant Date: 20191118
Implantable Lead Implant Date: 20191118
Implantable Lead Implant Date: 20191118
Implantable Lead Location: 753858
Implantable Lead Location: 753859
Implantable Lead Location: 753860
Implantable Lead Model: 293
Implantable Lead Model: 4674
Implantable Lead Model: 7741
Implantable Lead Serial Number: 1025951
Implantable Lead Serial Number: 442535
Implantable Lead Serial Number: 833002
Implantable Pulse Generator Implant Date: 20191118
Pulse Gen Serial Number: 219271

## 2020-09-03 DIAGNOSIS — Z743 Need for continuous supervision: Secondary | ICD-10-CM | POA: Diagnosis not present

## 2020-09-03 DIAGNOSIS — Z7902 Long term (current) use of antithrombotics/antiplatelets: Secondary | ICD-10-CM | POA: Diagnosis not present

## 2020-09-03 DIAGNOSIS — I2510 Atherosclerotic heart disease of native coronary artery without angina pectoris: Secondary | ICD-10-CM | POA: Diagnosis not present

## 2020-09-03 DIAGNOSIS — I110 Hypertensive heart disease with heart failure: Secondary | ICD-10-CM | POA: Diagnosis not present

## 2020-09-03 DIAGNOSIS — Z950 Presence of cardiac pacemaker: Secondary | ICD-10-CM | POA: Diagnosis not present

## 2020-09-03 DIAGNOSIS — R5381 Other malaise: Secondary | ICD-10-CM | POA: Diagnosis not present

## 2020-09-03 DIAGNOSIS — J690 Pneumonitis due to inhalation of food and vomit: Secondary | ICD-10-CM | POA: Diagnosis not present

## 2020-09-03 DIAGNOSIS — Z7401 Bed confinement status: Secondary | ICD-10-CM | POA: Diagnosis not present

## 2020-09-03 DIAGNOSIS — R0602 Shortness of breath: Secondary | ICD-10-CM | POA: Diagnosis not present

## 2020-09-03 DIAGNOSIS — E119 Type 2 diabetes mellitus without complications: Secondary | ICD-10-CM | POA: Diagnosis not present

## 2020-09-03 DIAGNOSIS — R0902 Hypoxemia: Secondary | ICD-10-CM | POA: Diagnosis not present

## 2020-09-03 DIAGNOSIS — J449 Chronic obstructive pulmonary disease, unspecified: Secondary | ICD-10-CM | POA: Diagnosis not present

## 2020-09-03 DIAGNOSIS — Z66 Do not resuscitate: Secondary | ICD-10-CM | POA: Diagnosis not present

## 2020-09-03 DIAGNOSIS — R1111 Vomiting without nausea: Secondary | ICD-10-CM | POA: Diagnosis not present

## 2020-09-03 DIAGNOSIS — Z794 Long term (current) use of insulin: Secondary | ICD-10-CM | POA: Diagnosis not present

## 2020-09-03 DIAGNOSIS — R069 Unspecified abnormalities of breathing: Secondary | ICD-10-CM | POA: Diagnosis not present

## 2020-09-03 DIAGNOSIS — F32A Depression, unspecified: Secondary | ICD-10-CM | POA: Diagnosis not present

## 2020-09-03 DIAGNOSIS — R21 Rash and other nonspecific skin eruption: Secondary | ICD-10-CM | POA: Diagnosis not present

## 2020-09-03 DIAGNOSIS — D649 Anemia, unspecified: Secondary | ICD-10-CM | POA: Diagnosis not present

## 2020-09-03 DIAGNOSIS — I959 Hypotension, unspecified: Secondary | ICD-10-CM | POA: Diagnosis not present

## 2020-09-03 DIAGNOSIS — R69 Illness, unspecified: Secondary | ICD-10-CM | POA: Diagnosis not present

## 2020-09-03 DIAGNOSIS — J9621 Acute and chronic respiratory failure with hypoxia: Secondary | ICD-10-CM | POA: Diagnosis not present

## 2020-09-03 DIAGNOSIS — I517 Cardiomegaly: Secondary | ICD-10-CM | POA: Diagnosis not present

## 2020-09-03 DIAGNOSIS — I4891 Unspecified atrial fibrillation: Secondary | ICD-10-CM | POA: Diagnosis not present

## 2020-09-03 DIAGNOSIS — Z79899 Other long term (current) drug therapy: Secondary | ICD-10-CM | POA: Diagnosis not present

## 2020-09-03 DIAGNOSIS — Z86711 Personal history of pulmonary embolism: Secondary | ICD-10-CM | POA: Diagnosis not present

## 2020-09-03 DIAGNOSIS — I509 Heart failure, unspecified: Secondary | ICD-10-CM | POA: Diagnosis not present

## 2020-09-03 NOTE — Progress Notes (Signed)
Remote ICD transmission.   

## 2020-09-04 DIAGNOSIS — J9621 Acute and chronic respiratory failure with hypoxia: Secondary | ICD-10-CM | POA: Diagnosis not present

## 2020-09-04 DIAGNOSIS — J690 Pneumonitis due to inhalation of food and vomit: Secondary | ICD-10-CM | POA: Diagnosis not present

## 2020-09-05 DIAGNOSIS — Z7401 Bed confinement status: Secondary | ICD-10-CM | POA: Diagnosis not present

## 2020-09-05 DIAGNOSIS — J9621 Acute and chronic respiratory failure with hypoxia: Secondary | ICD-10-CM | POA: Diagnosis not present

## 2020-09-05 DIAGNOSIS — R5381 Other malaise: Secondary | ICD-10-CM | POA: Diagnosis not present

## 2020-09-05 DIAGNOSIS — Z743 Need for continuous supervision: Secondary | ICD-10-CM | POA: Diagnosis not present

## 2020-09-05 DIAGNOSIS — R69 Illness, unspecified: Secondary | ICD-10-CM | POA: Diagnosis not present

## 2020-09-05 DIAGNOSIS — J690 Pneumonitis due to inhalation of food and vomit: Secondary | ICD-10-CM | POA: Diagnosis not present

## 2020-09-06 DIAGNOSIS — R278 Other lack of coordination: Secondary | ICD-10-CM | POA: Diagnosis not present

## 2020-09-06 DIAGNOSIS — M9907 Segmental and somatic dysfunction of upper extremity: Secondary | ICD-10-CM | POA: Diagnosis not present

## 2020-09-06 DIAGNOSIS — R262 Difficulty in walking, not elsewhere classified: Secondary | ICD-10-CM | POA: Diagnosis not present

## 2020-09-06 DIAGNOSIS — M7580 Other shoulder lesions, unspecified shoulder: Secondary | ICD-10-CM | POA: Diagnosis not present

## 2020-09-06 DIAGNOSIS — J441 Chronic obstructive pulmonary disease with (acute) exacerbation: Secondary | ICD-10-CM | POA: Diagnosis not present

## 2020-09-06 DIAGNOSIS — Z741 Need for assistance with personal care: Secondary | ICD-10-CM | POA: Diagnosis not present

## 2020-09-07 DIAGNOSIS — R262 Difficulty in walking, not elsewhere classified: Secondary | ICD-10-CM | POA: Diagnosis not present

## 2020-09-07 DIAGNOSIS — R278 Other lack of coordination: Secondary | ICD-10-CM | POA: Diagnosis not present

## 2020-09-07 DIAGNOSIS — Z741 Need for assistance with personal care: Secondary | ICD-10-CM | POA: Diagnosis not present

## 2020-09-07 DIAGNOSIS — M7580 Other shoulder lesions, unspecified shoulder: Secondary | ICD-10-CM | POA: Diagnosis not present

## 2020-09-07 DIAGNOSIS — M9907 Segmental and somatic dysfunction of upper extremity: Secondary | ICD-10-CM | POA: Diagnosis not present

## 2020-09-07 DIAGNOSIS — J441 Chronic obstructive pulmonary disease with (acute) exacerbation: Secondary | ICD-10-CM | POA: Diagnosis not present

## 2020-09-08 DIAGNOSIS — M9907 Segmental and somatic dysfunction of upper extremity: Secondary | ICD-10-CM | POA: Diagnosis not present

## 2020-09-08 DIAGNOSIS — R262 Difficulty in walking, not elsewhere classified: Secondary | ICD-10-CM | POA: Diagnosis not present

## 2020-09-08 DIAGNOSIS — Z741 Need for assistance with personal care: Secondary | ICD-10-CM | POA: Diagnosis not present

## 2020-09-08 DIAGNOSIS — J441 Chronic obstructive pulmonary disease with (acute) exacerbation: Secondary | ICD-10-CM | POA: Diagnosis not present

## 2020-09-08 DIAGNOSIS — R278 Other lack of coordination: Secondary | ICD-10-CM | POA: Diagnosis not present

## 2020-09-08 DIAGNOSIS — M7580 Other shoulder lesions, unspecified shoulder: Secondary | ICD-10-CM | POA: Diagnosis not present

## 2020-09-09 DIAGNOSIS — J441 Chronic obstructive pulmonary disease with (acute) exacerbation: Secondary | ICD-10-CM | POA: Diagnosis not present

## 2020-09-09 DIAGNOSIS — M9907 Segmental and somatic dysfunction of upper extremity: Secondary | ICD-10-CM | POA: Diagnosis not present

## 2020-09-09 DIAGNOSIS — R278 Other lack of coordination: Secondary | ICD-10-CM | POA: Diagnosis not present

## 2020-09-09 DIAGNOSIS — R262 Difficulty in walking, not elsewhere classified: Secondary | ICD-10-CM | POA: Diagnosis not present

## 2020-09-09 DIAGNOSIS — M7580 Other shoulder lesions, unspecified shoulder: Secondary | ICD-10-CM | POA: Diagnosis not present

## 2020-09-09 DIAGNOSIS — E1142 Type 2 diabetes mellitus with diabetic polyneuropathy: Secondary | ICD-10-CM | POA: Diagnosis not present

## 2020-09-09 DIAGNOSIS — Z741 Need for assistance with personal care: Secondary | ICD-10-CM | POA: Diagnosis not present

## 2020-09-09 DIAGNOSIS — I5023 Acute on chronic systolic (congestive) heart failure: Secondary | ICD-10-CM | POA: Diagnosis not present

## 2020-09-10 DIAGNOSIS — Z741 Need for assistance with personal care: Secondary | ICD-10-CM | POA: Diagnosis not present

## 2020-09-10 DIAGNOSIS — M9907 Segmental and somatic dysfunction of upper extremity: Secondary | ICD-10-CM | POA: Diagnosis not present

## 2020-09-10 DIAGNOSIS — J441 Chronic obstructive pulmonary disease with (acute) exacerbation: Secondary | ICD-10-CM | POA: Diagnosis not present

## 2020-09-10 DIAGNOSIS — M7580 Other shoulder lesions, unspecified shoulder: Secondary | ICD-10-CM | POA: Diagnosis not present

## 2020-09-10 DIAGNOSIS — R262 Difficulty in walking, not elsewhere classified: Secondary | ICD-10-CM | POA: Diagnosis not present

## 2020-09-10 DIAGNOSIS — R278 Other lack of coordination: Secondary | ICD-10-CM | POA: Diagnosis not present

## 2020-09-13 DIAGNOSIS — M7580 Other shoulder lesions, unspecified shoulder: Secondary | ICD-10-CM | POA: Diagnosis not present

## 2020-09-13 DIAGNOSIS — J441 Chronic obstructive pulmonary disease with (acute) exacerbation: Secondary | ICD-10-CM | POA: Diagnosis not present

## 2020-09-13 DIAGNOSIS — Z741 Need for assistance with personal care: Secondary | ICD-10-CM | POA: Diagnosis not present

## 2020-09-13 DIAGNOSIS — R278 Other lack of coordination: Secondary | ICD-10-CM | POA: Diagnosis not present

## 2020-09-13 DIAGNOSIS — R262 Difficulty in walking, not elsewhere classified: Secondary | ICD-10-CM | POA: Diagnosis not present

## 2020-09-13 DIAGNOSIS — M9907 Segmental and somatic dysfunction of upper extremity: Secondary | ICD-10-CM | POA: Diagnosis not present

## 2020-09-14 DIAGNOSIS — R262 Difficulty in walking, not elsewhere classified: Secondary | ICD-10-CM | POA: Diagnosis not present

## 2020-09-14 DIAGNOSIS — M9907 Segmental and somatic dysfunction of upper extremity: Secondary | ICD-10-CM | POA: Diagnosis not present

## 2020-09-14 DIAGNOSIS — M7580 Other shoulder lesions, unspecified shoulder: Secondary | ICD-10-CM | POA: Diagnosis not present

## 2020-09-14 DIAGNOSIS — J441 Chronic obstructive pulmonary disease with (acute) exacerbation: Secondary | ICD-10-CM | POA: Diagnosis not present

## 2020-09-14 DIAGNOSIS — R278 Other lack of coordination: Secondary | ICD-10-CM | POA: Diagnosis not present

## 2020-09-14 DIAGNOSIS — Z741 Need for assistance with personal care: Secondary | ICD-10-CM | POA: Diagnosis not present

## 2020-09-15 DIAGNOSIS — R262 Difficulty in walking, not elsewhere classified: Secondary | ICD-10-CM | POA: Diagnosis not present

## 2020-09-15 DIAGNOSIS — R278 Other lack of coordination: Secondary | ICD-10-CM | POA: Diagnosis not present

## 2020-09-15 DIAGNOSIS — Z741 Need for assistance with personal care: Secondary | ICD-10-CM | POA: Diagnosis not present

## 2020-09-15 DIAGNOSIS — M7580 Other shoulder lesions, unspecified shoulder: Secondary | ICD-10-CM | POA: Diagnosis not present

## 2020-09-15 DIAGNOSIS — J441 Chronic obstructive pulmonary disease with (acute) exacerbation: Secondary | ICD-10-CM | POA: Diagnosis not present

## 2020-09-15 DIAGNOSIS — M9907 Segmental and somatic dysfunction of upper extremity: Secondary | ICD-10-CM | POA: Diagnosis not present

## 2020-09-16 DIAGNOSIS — R278 Other lack of coordination: Secondary | ICD-10-CM | POA: Diagnosis not present

## 2020-09-16 DIAGNOSIS — R262 Difficulty in walking, not elsewhere classified: Secondary | ICD-10-CM | POA: Diagnosis not present

## 2020-09-16 DIAGNOSIS — Z741 Need for assistance with personal care: Secondary | ICD-10-CM | POA: Diagnosis not present

## 2020-09-16 DIAGNOSIS — J441 Chronic obstructive pulmonary disease with (acute) exacerbation: Secondary | ICD-10-CM | POA: Diagnosis not present

## 2020-09-16 DIAGNOSIS — M9907 Segmental and somatic dysfunction of upper extremity: Secondary | ICD-10-CM | POA: Diagnosis not present

## 2020-09-16 DIAGNOSIS — M7580 Other shoulder lesions, unspecified shoulder: Secondary | ICD-10-CM | POA: Diagnosis not present

## 2020-09-17 DIAGNOSIS — R262 Difficulty in walking, not elsewhere classified: Secondary | ICD-10-CM | POA: Diagnosis not present

## 2020-09-17 DIAGNOSIS — Z741 Need for assistance with personal care: Secondary | ICD-10-CM | POA: Diagnosis not present

## 2020-09-17 DIAGNOSIS — M7580 Other shoulder lesions, unspecified shoulder: Secondary | ICD-10-CM | POA: Diagnosis not present

## 2020-09-17 DIAGNOSIS — M9907 Segmental and somatic dysfunction of upper extremity: Secondary | ICD-10-CM | POA: Diagnosis not present

## 2020-09-17 DIAGNOSIS — J441 Chronic obstructive pulmonary disease with (acute) exacerbation: Secondary | ICD-10-CM | POA: Diagnosis not present

## 2020-09-17 DIAGNOSIS — R278 Other lack of coordination: Secondary | ICD-10-CM | POA: Diagnosis not present

## 2020-09-20 DIAGNOSIS — I4891 Unspecified atrial fibrillation: Secondary | ICD-10-CM | POA: Diagnosis not present

## 2020-09-20 DIAGNOSIS — M9907 Segmental and somatic dysfunction of upper extremity: Secondary | ICD-10-CM | POA: Diagnosis not present

## 2020-09-20 DIAGNOSIS — R278 Other lack of coordination: Secondary | ICD-10-CM | POA: Diagnosis not present

## 2020-09-20 DIAGNOSIS — Z741 Need for assistance with personal care: Secondary | ICD-10-CM | POA: Diagnosis not present

## 2020-09-20 DIAGNOSIS — M7580 Other shoulder lesions, unspecified shoulder: Secondary | ICD-10-CM | POA: Diagnosis not present

## 2020-09-20 DIAGNOSIS — I5032 Chronic diastolic (congestive) heart failure: Secondary | ICD-10-CM | POA: Diagnosis not present

## 2020-09-20 DIAGNOSIS — J441 Chronic obstructive pulmonary disease with (acute) exacerbation: Secondary | ICD-10-CM | POA: Diagnosis not present

## 2020-09-20 DIAGNOSIS — R262 Difficulty in walking, not elsewhere classified: Secondary | ICD-10-CM | POA: Diagnosis not present

## 2020-09-21 DIAGNOSIS — R262 Difficulty in walking, not elsewhere classified: Secondary | ICD-10-CM | POA: Diagnosis not present

## 2020-09-21 DIAGNOSIS — M9907 Segmental and somatic dysfunction of upper extremity: Secondary | ICD-10-CM | POA: Diagnosis not present

## 2020-09-21 DIAGNOSIS — R278 Other lack of coordination: Secondary | ICD-10-CM | POA: Diagnosis not present

## 2020-09-21 DIAGNOSIS — J441 Chronic obstructive pulmonary disease with (acute) exacerbation: Secondary | ICD-10-CM | POA: Diagnosis not present

## 2020-09-21 DIAGNOSIS — Z741 Need for assistance with personal care: Secondary | ICD-10-CM | POA: Diagnosis not present

## 2020-09-21 DIAGNOSIS — M7580 Other shoulder lesions, unspecified shoulder: Secondary | ICD-10-CM | POA: Diagnosis not present

## 2020-09-22 DIAGNOSIS — Z741 Need for assistance with personal care: Secondary | ICD-10-CM | POA: Diagnosis not present

## 2020-09-22 DIAGNOSIS — R262 Difficulty in walking, not elsewhere classified: Secondary | ICD-10-CM | POA: Diagnosis not present

## 2020-09-22 DIAGNOSIS — M9907 Segmental and somatic dysfunction of upper extremity: Secondary | ICD-10-CM | POA: Diagnosis not present

## 2020-09-22 DIAGNOSIS — R278 Other lack of coordination: Secondary | ICD-10-CM | POA: Diagnosis not present

## 2020-09-22 DIAGNOSIS — J441 Chronic obstructive pulmonary disease with (acute) exacerbation: Secondary | ICD-10-CM | POA: Diagnosis not present

## 2020-09-22 DIAGNOSIS — M7580 Other shoulder lesions, unspecified shoulder: Secondary | ICD-10-CM | POA: Diagnosis not present

## 2020-10-23 DEATH — deceased

## 2021-02-21 ENCOUNTER — Encounter: Payer: Medicare Other | Admitting: Cardiology

## 2021-02-21 NOTE — Progress Notes (Deleted)
Electrophysiology Office Note   Date:  02/21/2021   ID:  Ahmon, Tosi 09-Dec-1945, MRN [id number]  PCP:  Rochel Brome, MD  Cardiologist:  Debara Pickett Primary Electrophysiologist:  Aseel Uhde Meredith Leeds, MD    No chief complaint on file.    History of Present Illness: Johnathan Wright is a 75 y.o. male who is being seen today for the evaluation of atrial flutter at the request of Cox, Elnita Maxwell, MD. Presenting today for electrophysiology evaluation.    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  He is status post months 9-50 CRT-D implanted 09/09/2018.  This was implanted after hospitalization for symptomatic bradycardia.  Today, denies symptoms of palpitations, chest pain, shortness of breath, orthopnea, PND, lower extremity edema, claudication, dizziness, presyncope, syncope, bleeding, or neurologic sequela. The patient is tolerating medications without difficulties. ***   Past Medical History:  Diagnosis Date  . Atrial fibrillation ([place])   . Cellulitis   . CIDP (chronic inflammatory demyelinating polyneuropathy) ([place])   . Depression   . Diabetes ([place])   . Enlarged heart   . Gout    Past Surgical History:  Procedure Laterality Date  . APPENDECTOMY    . BIV ICD INSERTION CRT-D N/A 09/09/2018   Procedure: BIV ICD INSERTION CRT-D;  Surgeon: Evans Lance, MD;  Location: [facility];  Service: Cardiovascular;  Laterality: N/A;  . LEFT HEART CATH AND CORONARY ANGIOGRAPHY N/A 09/06/2018   Procedure: LEFT HEART CATH AND CORONARY ANGIOGRAPHY;  Surgeon: Troy Sine, MD;  Location: [facility];  Service: Cardiovascular;  Laterality: N/A;  . TEE WITHOUT CARDIOVERSION N/A 09/09/2018   Procedure: TRANSESOPHAGEAL ECHOCARDIOGRAM (TEE);  Surgeon: Sueanne Margarita, MD;  Location: [facility];  Service: Cardiovascular;   Laterality: N/A;  . VOCAL CORD LATERALIZATION, ENDOSCOPIC APPROACH W/ MLB       Current Outpatient Medications  Medication Sig Dispense Refill  . allopurinol (ZYLOPRIM) 100 MG tablet Take 100 mg by mouth daily.    Marland Kitchen apixaban (ELIQUIS) 5 MG TABS tablet Take 1 tablet (5 mg total) by mouth 2 (two) times daily. First dose PM of 09/11/18 60 tablet 3  . ARIPiprazole (ABILIFY) 2 MG tablet Take 2 mg by mouth daily.    Marland Kitchen atorvastatin (LIPITOR) 10 MG tablet Take 10 mg by mouth daily.    . Bilberry 1000 MG CAPS Take by mouth.    . carvedilol (COREG) 3.125 MG tablet Take 1 tablet (3.125 mg total) by mouth 2 (two) times daily with a meal. 60 tablet 3  . clotrimazole (LOTRIMIN) 1 % cream Apply 1 application topically 2 (two) times daily.    . fluconazole (DIFLUCAN) 100 MG tablet Take 1 tablet by mouth daily.    . furosemide (LASIX) 20 MG tablet Take 1 tablet (20 mg total) by mouth 2 (two) times daily. 180 tablet 2  . Insulin Glargine, 1 Unit Dial, (TOUJEO SOLOSTAR) 300 UNIT/ML SOPN Inject into the skin. Using 30 units daily    . L-Arginine 500 MG CAPS Take by mouth.    . levofloxacin (LEVAQUIN) 500 MG tablet Take 1 tablet by mouth daily.    . LUTEIN PO Take 66 mg by mouth daily.    Marland Kitchen LYSINE PO Take 1,000 mg by mouth daily.    Marland Kitchen PREDNISONE, PAK, PO Take by mouth as directed.    . silver sulfADIAZINE (SILVADENE) 1 %  cream Apply 1 application topically daily. 50 g 0  . tiZANidine (ZANAFLEX) 4 MG tablet TAKE 1 TABLET BY MOUTH EVERY 6 TO 8 HOURS AS NEEDED. do not exceed 3 doses IN 24 hours. FOR MUSCLE SPASMS    . vortioxetine HBr (TRINTELLIX) 10 MG TABS Take 20 mg by mouth daily.      No current facility-administered medications for this visit.    Allergies:   Other   Social History:  The patient  reports that he quit smoking about 51 years ago. He has never used smokeless tobacco. He reports current alcohol use. He reports that he does not use drugs.   Family History:  The patient's family history  includes Alcohol abuse in his father; Alzheimer's disease in his mother; Arthritis in his sister; Asthma in his father; Dementia in his mother; Diabetes in his sister; Heart disease in his mother.   ROS:  Please see the history of present illness.   Otherwise, review of systems is positive for none.   All other systems are reviewed and negative.   PHYSICAL EXAM: VS:  There were no vitals taken for this visit. , BMI There is no height or weight on file to calculate BMI. GEN: Well nourished, well developed, in no acute distress  HEENT: normal  Neck: no JVD, carotid bruits, or masses Cardiac: ***RRR; no murmurs, rubs, or gallops,no edema  Respiratory:  clear to auscultation bilaterally, normal work of breathing GI: soft, nontender, nondistended, + BS MS: no deformity or atrophy  Skin: warm and dry, device site well healed Neuro:  Strength and sensation are intact Psych: euthymic mood, full affect  EKG:  EKG {ACTION; IS/IS ALP:[id number]} ordered today. Personal review of the ekg ordered *** shows ***  Personal review of the device interrogation today. Results in [place]: No results found for requested labs within last 8760 hours.    Lipid Panel  No results found for: CHOL, TRIG, HDL, CHOLHDL, VLDL, LDLCALC, LDLDIRECT   Wt Readings from Last 3 Encounters:  02/17/19 (!) 350 lb (158.8 kg)  11/21/18 (!) 363 lb (164.7 kg)  10/28/18 (!) 378 lb (171.5 kg)      Other studies Reviewed: Additional studies/ records that were reviewed today include: TTE 09/09/18  Review of the above records today demonstrates:  - Left ventricle: Systolic function was severely reduced. The   estimated ejection fraction was in the range of 25% to 30%.   Severe diffuse hypokinesis. - Aortic valve: Mildly thickened leaflets. No evidence of   vegetation. - Aorta: Ascending aorta diameter: 44 mm (ED). - Ascending aorta: The ascending aorta was moderately dilated. - Mitral valve: No evidence of  vegetation. There was trivial   regurgitation. - Left atrium: The atrium was severely dilated. Acoustic contrast   opacification cannot exclude a small thrombus in the appendage.   There was severecontinuous spontaneous echo contrast (&quot;smoke&quot;) in   the cavity. The appendage was poorly visualized and small.   Emptying velocity was mildly reduced. - Atrial septum: There was increased thickness of the septum,   consistent with lipomatous hypertrophy. - Tricuspid valve: No evidence of vegetation. There was mild   regurgitation. - Pulmonic valve: No evidence of vegetation. There was trivial   regurgitation.   ASSESSMENT AND PLAN:  1.  Chronic systolic heart failure due to nonischemic cardiomyopathy: Status post [employer] CRT-D.  On carvedilol.***  2.  Symptomatic bradycardia: Status post [employer] CRT-D implanted 09/09/2018.  Device functioning appropriately.  No  changes.    3.  Typical atrial flutter: Currently on Eliquis.  CHA2DS2-VASc of 3.  ***  4.  Coronary artery disease: Nonobstructive based on catheterization.  No current chest pain.    5.  Obstructive sleep apnea: CPAP compliance encouraged  Current medicines are reviewed at length with the patient today.   The patient does not have concerns regarding his medicines.  The following changes were made today:  ***  Labs/ tests ordered today include:  No orders of the defined types were placed in this encounter.   Disposition:   FU with Bridgett Hattabaugh *** months  Signed, Gerrie Castiglia Meredith Leeds, MD  02/21/2021 1:39 PM     [address] [place] [address] [place] [address] [phone] (office) [phone] (fax)

## 2024-07-31 ENCOUNTER — Telehealth: Payer: Self-pay

## 2024-07-31 NOTE — Telephone Encounter (Signed)
 Patient still showing as active in [facility] and in [facility]; however, have not received communications since 10/21/20.  Also, see no documentation in record as to status of patient.   Forwarding to [place], CMA remote specialist to investigate further.  Appears in 2021 patient was possibly at a care facility [facility]   Patient has a RV-GORE LEAD.

## 2024-07-31 NOTE — Telephone Encounter (Signed)
 Called 3 facilities with them having no recollection of pt ever being there. Also called friend on file (shirley) number is out of service.
